# Patient Record
Sex: Female | Born: 1955
Health system: Southern US, Community
[De-identification: ages and names within clinical notes are randomized; demographics above are authoritative.]

## PROBLEM LIST (undated history)

## (undated) DIAGNOSIS — I499 Cardiac arrhythmia, unspecified: Secondary | ICD-10-CM

## (undated) DIAGNOSIS — M5090 Cervical disc disorder, unspecified, unspecified cervical region: Secondary | ICD-10-CM

## (undated) DIAGNOSIS — F32A Depression, unspecified: Secondary | ICD-10-CM

## (undated) DIAGNOSIS — Z95810 Presence of automatic (implantable) cardiac defibrillator: Secondary | ICD-10-CM

## (undated) DIAGNOSIS — I255 Ischemic cardiomyopathy: Secondary | ICD-10-CM

## (undated) DIAGNOSIS — C55 Malignant neoplasm of uterus, part unspecified: Secondary | ICD-10-CM

## (undated) DIAGNOSIS — D649 Anemia, unspecified: Secondary | ICD-10-CM

## (undated) DIAGNOSIS — I2510 Atherosclerotic heart disease of native coronary artery without angina pectoris: Secondary | ICD-10-CM

## (undated) DIAGNOSIS — K219 Gastro-esophageal reflux disease without esophagitis: Secondary | ICD-10-CM

## (undated) DIAGNOSIS — F329 Major depressive disorder, single episode, unspecified: Secondary | ICD-10-CM

## (undated) DIAGNOSIS — I219 Acute myocardial infarction, unspecified: Secondary | ICD-10-CM

## (undated) DIAGNOSIS — E785 Hyperlipidemia, unspecified: Secondary | ICD-10-CM

## (undated) HISTORY — DX: Presence of automatic (implantable) cardiac defibrillator: Z95.810

## (undated) HISTORY — PX: ABDOMINAL HYSTERECTOMY: SHX81

## (undated) HISTORY — DX: Ischemic cardiomyopathy: I25.5

## (undated) HISTORY — DX: Gastro-esophageal reflux disease without esophagitis: K21.9

## (undated) HISTORY — PX: OTHER SURGICAL HISTORY: SHX169

## (undated) HISTORY — PX: CERVICAL DISC SURGERY: SHX588

## (undated) HISTORY — PX: MASTECTOMY, PARTIAL: SHX709

## (undated) HISTORY — DX: Hyperlipidemia, unspecified: E78.5

## (undated) HISTORY — PX: PARTIAL HYSTERECTOMY: SHX80

## (undated) HISTORY — DX: Depression, unspecified: F32.A

## (undated) HISTORY — DX: Major depressive disorder, single episode, unspecified: F32.9

## (undated) HISTORY — DX: Cervical disc disorder, unspecified, unspecified cervical region: M50.90

## (undated) HISTORY — PX: CORONARY ANGIOPLASTY WITH STENT PLACEMENT: SHX49

---

## 2002-05-16 ENCOUNTER — Ambulatory Visit (HOSPITAL_COMMUNITY): Admission: RE | Admit: 2002-05-16 | Discharge: 2002-05-16 | Payer: Self-pay | Admitting: Family Medicine

## 2002-05-16 ENCOUNTER — Encounter: Payer: Self-pay | Admitting: Family Medicine

## 2004-01-31 ENCOUNTER — Ambulatory Visit (HOSPITAL_COMMUNITY): Admission: RE | Admit: 2004-01-31 | Discharge: 2004-01-31 | Payer: Self-pay | Admitting: Internal Medicine

## 2004-04-19 ENCOUNTER — Ambulatory Visit (HOSPITAL_COMMUNITY): Admission: RE | Admit: 2004-04-19 | Discharge: 2004-04-19 | Payer: Self-pay | Admitting: Internal Medicine

## 2004-04-30 ENCOUNTER — Ambulatory Visit (HOSPITAL_COMMUNITY): Admission: RE | Admit: 2004-04-30 | Discharge: 2004-04-30 | Payer: Self-pay | Admitting: Internal Medicine

## 2004-08-05 ENCOUNTER — Ambulatory Visit (HOSPITAL_COMMUNITY): Admission: RE | Admit: 2004-08-05 | Discharge: 2004-08-05 | Payer: Self-pay | Admitting: Family Medicine

## 2004-12-15 ENCOUNTER — Ambulatory Visit (HOSPITAL_COMMUNITY): Admission: RE | Admit: 2004-12-15 | Discharge: 2004-12-15 | Payer: Self-pay | Admitting: General Surgery

## 2005-02-21 ENCOUNTER — Ambulatory Visit (HOSPITAL_COMMUNITY): Admission: RE | Admit: 2005-02-21 | Discharge: 2005-02-21 | Payer: Self-pay | Admitting: General Surgery

## 2005-04-14 ENCOUNTER — Observation Stay (HOSPITAL_COMMUNITY): Admission: RE | Admit: 2005-04-14 | Discharge: 2005-04-15 | Payer: Self-pay | Admitting: Neurosurgery

## 2006-02-06 ENCOUNTER — Encounter (HOSPITAL_COMMUNITY): Admission: RE | Admit: 2006-02-06 | Discharge: 2006-03-08 | Payer: Self-pay | Admitting: Neurosurgery

## 2009-07-23 ENCOUNTER — Ambulatory Visit: Payer: Self-pay | Admitting: Cardiology

## 2009-07-23 ENCOUNTER — Inpatient Hospital Stay (HOSPITAL_COMMUNITY): Admission: EM | Admit: 2009-07-23 | Discharge: 2009-07-29 | Payer: Self-pay | Admitting: Emergency Medicine

## 2009-07-23 ENCOUNTER — Ambulatory Visit: Payer: Self-pay | Admitting: Internal Medicine

## 2009-07-23 ENCOUNTER — Encounter: Payer: Self-pay | Admitting: Internal Medicine

## 2009-07-24 ENCOUNTER — Encounter: Payer: Self-pay | Admitting: Cardiovascular Disease

## 2009-08-04 ENCOUNTER — Encounter (INDEPENDENT_AMBULATORY_CARE_PROVIDER_SITE_OTHER): Payer: Self-pay | Admitting: *Deleted

## 2009-08-05 ENCOUNTER — Encounter: Payer: Self-pay | Admitting: Cardiovascular Disease

## 2009-08-05 ENCOUNTER — Telehealth (INDEPENDENT_AMBULATORY_CARE_PROVIDER_SITE_OTHER): Payer: Self-pay | Admitting: *Deleted

## 2009-08-05 DIAGNOSIS — K219 Gastro-esophageal reflux disease without esophagitis: Secondary | ICD-10-CM

## 2009-08-05 DIAGNOSIS — F329 Major depressive disorder, single episode, unspecified: Secondary | ICD-10-CM

## 2009-08-05 DIAGNOSIS — E785 Hyperlipidemia, unspecified: Secondary | ICD-10-CM | POA: Insufficient documentation

## 2009-08-05 DIAGNOSIS — M5030 Other cervical disc degeneration, unspecified cervical region: Secondary | ICD-10-CM

## 2009-08-05 DIAGNOSIS — Z87891 Personal history of nicotine dependence: Secondary | ICD-10-CM | POA: Insufficient documentation

## 2009-08-11 ENCOUNTER — Ambulatory Visit: Payer: Self-pay | Admitting: Cardiovascular Disease

## 2009-09-04 ENCOUNTER — Ambulatory Visit: Payer: Self-pay | Admitting: Cardiology

## 2009-09-04 ENCOUNTER — Ambulatory Visit: Payer: Self-pay | Admitting: Cardiovascular Disease

## 2009-09-04 ENCOUNTER — Ambulatory Visit (HOSPITAL_COMMUNITY): Admission: RE | Admit: 2009-09-04 | Discharge: 2009-09-04 | Payer: Self-pay | Admitting: Cardiovascular Disease

## 2009-09-04 ENCOUNTER — Encounter: Payer: Self-pay | Admitting: Cardiovascular Disease

## 2009-09-04 ENCOUNTER — Ambulatory Visit: Payer: Self-pay

## 2009-09-04 ENCOUNTER — Telehealth (INDEPENDENT_AMBULATORY_CARE_PROVIDER_SITE_OTHER): Payer: Self-pay | Admitting: *Deleted

## 2009-09-24 ENCOUNTER — Ambulatory Visit: Payer: Self-pay | Admitting: Internal Medicine

## 2009-09-25 ENCOUNTER — Encounter: Payer: Self-pay | Admitting: Internal Medicine

## 2009-09-28 ENCOUNTER — Ambulatory Visit: Payer: Self-pay | Admitting: Internal Medicine

## 2009-09-28 ENCOUNTER — Inpatient Hospital Stay (HOSPITAL_COMMUNITY): Admission: AD | Admit: 2009-09-28 | Discharge: 2009-09-29 | Payer: Self-pay | Admitting: Internal Medicine

## 2009-09-29 ENCOUNTER — Encounter: Payer: Self-pay | Admitting: Internal Medicine

## 2009-09-30 ENCOUNTER — Encounter: Payer: Self-pay | Admitting: Internal Medicine

## 2009-10-08 ENCOUNTER — Telehealth: Payer: Self-pay | Admitting: Internal Medicine

## 2009-10-12 ENCOUNTER — Encounter: Payer: Self-pay | Admitting: Internal Medicine

## 2009-10-12 ENCOUNTER — Ambulatory Visit: Payer: Self-pay

## 2009-10-23 ENCOUNTER — Telehealth: Payer: Self-pay | Admitting: Internal Medicine

## 2009-10-26 ENCOUNTER — Encounter: Payer: Self-pay | Admitting: Internal Medicine

## 2009-10-28 ENCOUNTER — Telehealth (INDEPENDENT_AMBULATORY_CARE_PROVIDER_SITE_OTHER): Payer: Self-pay | Admitting: *Deleted

## 2009-11-03 ENCOUNTER — Ambulatory Visit: Payer: Self-pay | Admitting: Cardiovascular Disease

## 2009-11-18 ENCOUNTER — Encounter: Payer: Self-pay | Admitting: Cardiovascular Disease

## 2009-11-23 LAB — CONVERTED CEMR LAB
ALT: 29 units/L (ref 0–35)
AST: 26 units/L (ref 0–37)
Albumin: 4.2 g/dL (ref 3.5–5.2)
Alkaline Phosphatase: 76 units/L (ref 39–117)
BUN: 11 mg/dL (ref 6–23)
Bilirubin, Direct: 0.1 mg/dL (ref 0.0–0.3)
CO2: 27 meq/L (ref 19–32)
Calcium: 8.9 mg/dL (ref 8.4–10.5)
Chloride: 105 meq/L (ref 96–112)
Cholesterol: 148 mg/dL (ref 0–200)
Creatinine, Ser: 0.83 mg/dL (ref 0.40–1.20)
Glucose, Bld: 92 mg/dL (ref 70–99)
HDL: 42 mg/dL (ref >39)
Indirect Bilirubin: 0.2 mg/dL (ref 0.0–0.9)
LDL Cholesterol: 67 mg/dL (ref 0–99)
Potassium: 5.1 meq/L (ref 3.5–5.3)
Sodium: 139 meq/L (ref 135–145)
Total Bilirubin: 0.3 mg/dL (ref 0.3–1.2)
Total CHOL/HDL Ratio: 3.5
Total Protein: 6.9 g/dL (ref 6.0–8.3)
Triglycerides: 197 mg/dL — ABNORMAL HIGH (ref <150)
VLDL: 39 mg/dL (ref 0–40)

## 2009-12-29 ENCOUNTER — Ambulatory Visit: Payer: Self-pay | Admitting: Internal Medicine

## 2010-01-11 ENCOUNTER — Ambulatory Visit: Payer: Self-pay | Admitting: Cardiovascular Disease

## 2010-01-11 DIAGNOSIS — I2510 Atherosclerotic heart disease of native coronary artery without angina pectoris: Secondary | ICD-10-CM | POA: Insufficient documentation

## 2010-04-01 ENCOUNTER — Ambulatory Visit: Payer: Self-pay | Admitting: Internal Medicine

## 2010-04-02 ENCOUNTER — Encounter: Payer: Self-pay | Admitting: Internal Medicine

## 2010-04-07 ENCOUNTER — Encounter: Payer: Self-pay | Admitting: Internal Medicine

## 2010-05-11 ENCOUNTER — Ambulatory Visit: Payer: Self-pay | Admitting: Cardiovascular Disease

## 2010-07-08 ENCOUNTER — Ambulatory Visit: Payer: Self-pay | Admitting: Internal Medicine

## 2010-08-11 ENCOUNTER — Encounter: Payer: Self-pay | Admitting: Internal Medicine

## 2010-08-23 ENCOUNTER — Encounter: Payer: Self-pay | Admitting: Internal Medicine

## 2010-10-17 LAB — CONVERTED CEMR LAB
ALT: 19 units/L (ref 0–35)
AST: 19 units/L (ref 0–37)
Albumin: 4.5 g/dL (ref 3.5–5.2)
Alkaline Phosphatase: 86 units/L (ref 39–117)
BUN: 10 mg/dL (ref 6–23)
Basophils Absolute: 0.1 10*3/uL (ref 0.0–0.1)
Basophils Relative: 0.9 % (ref 0.0–3.0)
Bilirubin, Direct: 0 mg/dL (ref 0.0–0.3)
CO2: 27 meq/L (ref 19–32)
Calcium: 9.6 mg/dL (ref 8.4–10.5)
Chloride: 105 meq/L (ref 96–112)
Cholesterol: 127 mg/dL (ref 0–200)
Creatinine, Ser: 0.9 mg/dL (ref 0.4–1.2)
Eosinophils Absolute: 0.1 10*3/uL (ref 0.0–0.7)
Eosinophils Relative: 1.5 % (ref 0.0–5.0)
GFR calc non Af Amer: 69.95 mL/min (ref >60)
Glucose, Bld: 96 mg/dL (ref 70–99)
HCT: 40.3 % (ref 36.0–46.0)
HDL: 45.9 mg/dL (ref >39.00)
Hemoglobin: 13.1 g/dL (ref 12.0–15.0)
INR: 1.2 — ABNORMAL HIGH (ref 0.8–1.0)
LDL Cholesterol: 59 mg/dL (ref 0–99)
Lymphocytes Relative: 48.2 % — ABNORMAL HIGH (ref 12.0–46.0)
Lymphs Abs: 3.2 10*3/uL (ref 0.7–4.0)
MCHC: 32.5 g/dL (ref 30.0–36.0)
MCV: 90.8 fL (ref 78.0–100.0)
Monocytes Absolute: 0.6 10*3/uL (ref 0.1–1.0)
Monocytes Relative: 9.4 % (ref 3.0–12.0)
Neutro Abs: 2.6 10*3/uL (ref 1.4–7.7)
Neutrophils Relative %: 40 % — ABNORMAL LOW (ref 43.0–77.0)
Platelets: 202 10*3/uL (ref 150.0–400.0)
Potassium: 4 meq/L (ref 3.5–5.1)
Prothrombin Time: 11.9 s — ABNORMAL HIGH (ref 9.1–11.7)
RBC: 4.43 M/uL (ref 3.87–5.11)
RDW: 11.9 % (ref 11.5–14.6)
Sodium: 139 meq/L (ref 135–145)
Total Bilirubin: 0.8 mg/dL (ref 0.3–1.2)
Total CHOL/HDL Ratio: 3
Total Protein: 7.9 g/dL (ref 6.0–8.3)
Triglycerides: 112 mg/dL (ref 0.0–149.0)
VLDL: 22.4 mg/dL (ref 0.0–40.0)
WBC: 6.6 10*3/uL (ref 4.5–10.5)
aPTT: 32.1 s — ABNORMAL HIGH (ref 21.7–28.8)

## 2010-10-19 ENCOUNTER — Encounter: Payer: Self-pay | Admitting: Internal Medicine

## 2010-10-19 ENCOUNTER — Ambulatory Visit
Admission: RE | Admit: 2010-10-19 | Discharge: 2010-10-19 | Payer: Self-pay | Source: Home / Self Care | Attending: Internal Medicine | Admitting: Internal Medicine

## 2010-10-19 DIAGNOSIS — I5020 Unspecified systolic (congestive) heart failure: Secondary | ICD-10-CM | POA: Insufficient documentation

## 2010-10-19 DIAGNOSIS — R5383 Other fatigue: Secondary | ICD-10-CM

## 2010-10-19 DIAGNOSIS — I5022 Chronic systolic (congestive) heart failure: Secondary | ICD-10-CM | POA: Insufficient documentation

## 2010-10-19 DIAGNOSIS — R5381 Other malaise: Secondary | ICD-10-CM | POA: Insufficient documentation

## 2010-10-19 NOTE — Assessment & Plan Note (Signed)
Summary: f34m   Visit Type:  Follow-up Primary Provider:  Dr. Sherwood Gambler  CC:  No complaints.  History of Present Illness: 55 year-old woman s/p anterior MI 2010, who underwent primary PCI at that time. She had persisent severe LV dysfunction and had an ICD placed for primary SCD prevention. She presents today for follow-up evaluation. She continues to do well from a symptomatic standpoint. Active with regular walking, gardening, etc. Denies chest pain, dyspnea, edema, lightheadedness, or palps.    Current Medications (verified): 1)  Famotidine 20 Mg Tabs (Famotidine) .... Take 1 Tablet By Mouth Two Times A Day 2)  Acetaminophen 325 Mg Tabs (Acetaminophen) .... As Needed 3)  Aspirin Ec 325 Mg Tbec (Aspirin) .... Take One Tablet By Mouth Daily 4)  Cymbalta 30 Mg Cpep (Duloxetine Hcl) .... Take 1 Capsule By Mouth Once A Day 5)  Crestor 40 Mg Tabs (Rosuvastatin Calcium) .... Take 1 Tablet By Mouth Once A Day 6)  Carvedilol 12.5 Mg Tabs (Carvedilol) .... Take One Tablet By Mouth Twice A Day 7)  Plavix 75 Mg Tabs (Clopidogrel Bisulfate) .... Take One Tablet By Mouth Daily 8)  Losartan Potassium 50 Mg Tabs (Losartan Potassium) .... Take 1 Tablet By Mouth Once A Day 9)  Nitrostat 0.4 Mg Subl (Nitroglycerin) .... As Directed As Needed  Allergies (verified): No Known Drug Allergies  Past History:  Past medical history reviewed for relevance to current acute and chronic problems.  Past Medical History: Current Problems:  ISCHEMIC CARDIOMYOPATHY (ICD-414.8), post-MI LVEF less than 30%. MYOCARDIAL INFARCTION, ACUTE, ANTEROLATERAL WALL (ICD-410.00), treated with BMS of the LAD. DYSLIPIDEMIA (ICD-272.4) GASTROESOPHAGEAL REFLUX DISEASE (ICD-530.81) TOBACCO ABUSE, HX OF (ICD-V15.82) DEPRESSION (ICD-311) DISC DISEASE, CERVICAL (ICD-722.4) S/P ICD  Social History:   She is currently on disability.  She lives with her   fiance.  She previously worked as a Emergency planning/management officer for [employer].  She   smokes a  half pack cigarettes, which she has done for at least 10 years. QUIT smoking at the time of her MI.  No significant alcohol or drug use reported.   Review of Systems       Negative except as per HPI   Vital Signs:  Patient profile:   55 year old female Height:      64 inches Weight:      155.50 pounds BMI:     26.79 Pulse rate:   68 / minute Pulse rhythm:   regular Resp:     18 per minute BP sitting:   96 / 54  (left arm) Cuff size:   large  Vitals Entered By: Vikki Ports (January 11, 2010 2:16 PM)  Physical Exam  General:  Pt is alert and oriented, in no acute distress. HEENT: normal Neck: normal carotid upstrokes without bruits, JVP normal Lungs: CTA CV: RRR without murmur or gallop Abd: soft, NT, positive BS, no bruit, no organomegaly Ext: no clubbing, cyanosis, or edema. peripheral pulses 2+ and equal Skin: warm and dry without rash     ICD Specifications Following MD:  Sherryl Manges, MD     Referring MD:  Excell Seltzer ICD Vendor:  Medtronic     ICD Model Number:  D274VRC     ICD Serial Number:  [id number] ICD DOI:  09/28/2009     ICD Implanting MD:  Sherryl Manges, MD  Lead 1:    Location: RV     DOI: 09/28/2009     Model #: [id number]     Serial #: [id number]  Status: active  Indications::  ICM (414.8)    ICD Follow Up ICD Dependent:  No      Episodes Coumadin:  No  Brady Parameters Mode VVI     Lower Rate Limit:  40      Tachy Zones VF:  214     VT:  176     Impression & Recommendations:  Problem # 1:  CAD, NATIVE VESSEL (ICD-414.01) Stable without angina. Continue current medical program without changes. Pt tolerating dual antiplatelet Rx with ASA and Plavix. Continue risk reduction measures as below.  Her updated medication list for this problem includes:    Aspirin Ec 325 Mg Tbec (Aspirin) .Marland Kitchen... Take one tablet by mouth daily    Carvedilol 12.5 Mg Tabs (Carvedilol) .Marland Kitchen... Take one tablet by mouth twice a day    Plavix 75 Mg Tabs (Clopidogrel bisulfate)  .Marland Kitchen... Take one tablet by mouth daily    Nitrostat 0.4 Mg Subl (Nitroglycerin) .Marland Kitchen... As directed as needed  Problem # 2:  ISCHEMIC CARDIOMYOPATHY (ICD-414.8) Carvedilol recently increased to 12.5 mg two times a day recently. She has a soft BP and unable to further titrate ARB or B-blocker at this point. Will reassess and attmept to increase at f/u visit.  Her updated medication list for this problem includes:    Aspirin Ec 325 Mg Tbec (Aspirin) .Marland Kitchen... Take one tablet by mouth daily    Carvedilol 12.5 Mg Tabs (Carvedilol) .Marland Kitchen... Take one tablet by mouth twice a day    Plavix 75 Mg Tabs (Clopidogrel bisulfate) .Marland Kitchen... Take one tablet by mouth daily    Losartan Potassium 50 Mg Tabs (Losartan potassium) .Marland Kitchen... Take 1 tablet by mouth once a day    Nitrostat 0.4 Mg Subl (Nitroglycerin) .Marland Kitchen... As directed as needed  Problem # 3:  DYSLIPIDEMIA (ICD-272.4) Lipids excellent. Repeat next year.  Her updated medication list for this problem includes:    Crestor 40 Mg Tabs (Rosuvastatin calcium) .Marland Kitchen... Take 1 tablet by mouth once a day  CHOL: 148 (11/18/2009)   LDL: 67 (11/18/2009)   HDL: 42 (11/18/2009)   TG: 197 (11/18/2009)  Patient Instructions: 1)  Your physician recommends that you schedule a follow-up appointment in: 4 MONTHS 2)  Your physician recommends that you continue on your current medications as directed. Please refer to the Current Medication list given to you today.

## 2010-10-19 NOTE — Progress Notes (Signed)
Summary: Letter for LifeVest  Phone Note Other Incoming Call back at [phone]   Caller: [employer] Summary of Call: returning call Initial call taken by: Migdalia Dk,  October 23, 2009 11:10 AM  Follow-up for Phone Call        The insurance company is not wanting to pay for the vest. She needs a letter with specific information to send to insurance in hopes she can help the pt get this covered. Pt has [employer] of [place]. Letter needs to state: 1. condition that warranted the vest 2. plan of treatment 3. why was an ICD contra indicated. Hosp d/c summary (11/09) does not elaborate on this. I will forward this to Dr. Graciela Husbands for f/u.  Follow-up by: Duncan Dull, RN, BSN,  October 23, 2009 11:29 AM

## 2010-10-19 NOTE — Procedures (Signed)
Summary: wound check/medtronic   Problems Prior to Update: 1)  Ischemic Cardiomyopathy  (ICD-414.8) 2)  Myocardial Infarction, Acute, Anterolateral Wall  (ICD-410.00) 3)  Dyslipidemia  (ICD-272.4) 4)  Gastroesophageal Reflux Disease  (ICD-530.81) 5)  Tobacco Abuse, Hx of  (ICD-V15.82) 6)  Depression  (ICD-311) 7)  Disc Disease, Cervical  (ICD-722.4)  Medications Prior to Update: 1)  Famotidine 20 Mg Tabs (Famotidine) .... Take 1 Tablet By Mouth Two Times A Day 2)  Acetaminophen 325 Mg Tabs (Acetaminophen) .... As Needed 3)  Aspirin Ec 325 Mg Tbec (Aspirin) .... Take One Tablet By Mouth Daily 4)  Cymbalta 30 Mg Cpep (Duloxetine Hcl) .... Take 1 Capsule By Mouth Once A Day 5)  Crestor 40 Mg Tabs (Rosuvastatin Calcium) .... Take 1 Tablet By Mouth Once A Day 6)  Carvedilol 6.25 Mg Tabs (Carvedilol) .... Take One Tablet By Mouth Twice A Day 7)  Plavix 75 Mg Tabs (Clopidogrel Bisulfate) .... Take One Tablet By Mouth Daily 8)  Lisinopril 5 Mg Tabs (Lisinopril) .... Take One Tablet By Mouth Daily 9)  Nitrostat 0.4 Mg Subl (Nitroglycerin) .... As Directed As Needed  Current Medications (verified): 1)  Famotidine 20 Mg Tabs (Famotidine) .... Take 1 Tablet By Mouth Two Times A Day 2)  Acetaminophen 325 Mg Tabs (Acetaminophen) .... As Needed 3)  Aspirin Ec 325 Mg Tbec (Aspirin) .... Take One Tablet By Mouth Daily 4)  Cymbalta 30 Mg Cpep (Duloxetine Hcl) .... Take 1 Capsule By Mouth Once A Day 5)  Crestor 40 Mg Tabs (Rosuvastatin Calcium) .... Take 1 Tablet By Mouth Once A Day 6)  Carvedilol 6.25 Mg Tabs (Carvedilol) .... Take One Tablet By Mouth Twice A Day 7)  Plavix 75 Mg Tabs (Clopidogrel Bisulfate) .... Take One Tablet By Mouth Daily 8)  Lisinopril 5 Mg Tabs (Lisinopril) .... Take One Tablet By Mouth Daily 9)  Nitrostat 0.4 Mg Subl (Nitroglycerin) .... As Directed As Needed  Allergies (verified): No Known Drug Allergies   ICD Specifications Following MD:  Sherryl Manges, MD     Referring  MD:  Excell Seltzer ICD Vendor:  Medtronic     ICD Model Number:  [id number]     ICD Serial Number:  [id number] ICD DOI:  09/28/2009     ICD Implanting MD:  Sherryl Manges, MD  Lead 1:    Location: RV     DOI: 09/28/2009     Model #: [id number]     Serial #: [id number]     Status: active  Indications::  ICM (414.8)    ICD Follow Up Remote Check?  No Battery Voltage:  3.21 V     Charge Time:  8.7 seconds     Underlying rhythm:  SR ICD Dependent:  No       ICD Device Measurements Right Ventricle:  Amplitude: 10.3 mV, Impedance: 551 ohms, Threshold: 0.5 V at 0.4 msec Shock Impedance: 57 ohms   Episodes Shock:  0     ATP:  0     Nonsustained:  0     Ventricular Pacing:  0%  Brady Parameters Mode VVI     Lower Rate Limit:  40      Tachy Zones VF:  214     VT:  176     Next Cardiology Appt Due:  12/29/2009 Tech Comments:  Wound check appt. Steri-strips removed by patient.  Wound well healed.  Normal device function.  Wound care and shock instructions reviewed with patient.  Carelink box ordered.  No changes  made today.  Pt prefers to see Dr Graciela Husbands in [place] than switch to another physician in [place].  ROV 4-11 SK. Gypsy Balsam RN BSN  October 12, 2009 10:35 AM

## 2010-10-19 NOTE — Letter (Signed)
Summary: Remote Device Check  [facility], Main Office  [address]   [place], [place] [address]   Phone: [phone]  Fax: [phone]     August 11, 2010 MRN: [id number]   [name] [name] [address] [place], [place]  [address]   Dear [name],   Your remote transmission was recieved and reviewed by your physician.  All diagnostics were within normal limits for you.  __X____Your next office visit is scheduled for:  10-19-2010 @ 920 with Dr Graciela Husbands.                               Sincerely,  Vella Kohler

## 2010-10-19 NOTE — Letter (Signed)
Summary: [facility] Note   Imported By: Roderic Ovens 11/06/2009 14:21:29  _____________________________________________________________________  External Attachment:    Type:   Image     Comment:   External Document

## 2010-10-19 NOTE — Miscellaneous (Signed)
Summary: Device preload  Clinical Lists Changes  Observations: Added new observation of ICD INDICATN: CM  (09/30/2009 11:42) Added new observation of ICDLEADSER1: A (09/30/2009 11:42) Added new observation of ICDLEADMOD1: [id number] (09/30/2009 11:42) Added new observation of ICDLEADLOC1: RV (09/30/2009 11:42) Added new observation of ICD IMP MD: Sherryl Manges, MD (09/30/2009 11:42) Added new observation of ICDLEADDOI1: 09/28/2009 (09/30/2009 11:42) Added new observation of ICD IMPL DTE: 09/28/2009 (09/30/2009 11:42) Added new observation of ICD SERL#: [id number] H (09/30/2009 11:42) Added new observation of ICD MODL#: [id number] (09/30/2009 62:13) Added new observation of ICDMANUFACTR: Medtronic (09/30/2009 11:42) Added new observation of ICD MD: Sherryl Manges, MD (09/30/2009 11:42)       ICD Specifications Following MD:  Sherryl Manges, MD     ICD Vendor:  Medtronic     ICD Model Number:  D274VRC     ICD Serial Number:  [id number] ICD DOI:  09/28/2009     ICD Implanting MD:  Sherryl Manges, MD  Lead 1:    Location: RV     DOI: 09/28/2009     Model #: [id number]     Serial #: [id number]      Indications::  CM

## 2010-10-19 NOTE — Assessment & Plan Note (Signed)
Summary: POSS DEVICE PLACEMENT (414.8)/MMR   Primary Provider:  Dr. Sherwood Gambler  CC:  possible device placement.  Pt now wearing LIFEVEST portable defibrillator.  Pt reports that she has been feeling good and quit smoking 9 weeks ago.Marland Kitchen  History of Present Illness: [name] [name] is a 55 year-old woman with Anterior MI November 2010 presenting today for follow-up. She presented late into the course of an Anterior MI on 07/23/2009 and was treated with primary PCI using a bare-metal stent.  She had a large infarct with LVEF less than 30% and she was discharged home with a Lifevest to prevent sudden cardiac death within the first 40 days. She is doing well at present. Denies chest pain, dyspnea, edema, palps, lightheadedness, or syncope. She is slowly increasing her activity level. She feels well at present and has no complaints today. She  is seen in followup for consideration of ICD implantation because of anterior wall MI associated with prolonged left ventricular dysfunction as reassessed by an echo about 3 weeks ago. It remains in the 30-35% range. She is wearing a life vest for the interim. she has stopped smokiing  The patient denies SOB, chest pain, edema or palpitations   Current Medications (verified): 1)  Famotidine 20 Mg Tabs (Famotidine) .... Take 1 Tablet By Mouth Two Times A Day 2)  Acetaminophen 325 Mg Tabs (Acetaminophen) .... As Needed 3)  Aspirin Ec 325 Mg Tbec (Aspirin) .... Take One Tablet By Mouth Daily 4)  Cymbalta 30 Mg Cpep (Duloxetine Hcl) .... Take 1 Capsule By Mouth Once A Day 5)  Crestor 40 Mg Tabs (Rosuvastatin Calcium) .... Take 1 Tablet By Mouth Once A Day 6)  Carvedilol 6.25 Mg Tabs (Carvedilol) .... Take One Tablet By Mouth Twice A Day 7)  Plavix 75 Mg Tabs (Clopidogrel Bisulfate) .... Take One Tablet By Mouth Daily 8)  Lisinopril 5 Mg Tabs (Lisinopril) .... Take One Tablet By Mouth Daily 9)  Nitrostat 0.4 Mg Subl (Nitroglycerin) .... As Directed As Needed  Allergies  (verified): No Known Drug Allergies  Past History:  Past Medical History: Last updated: 08/11/2009 Current Problems:  ISCHEMIC CARDIOMYOPATHY (ICD-414.8), post-MI LVEF less than 30%. MYOCARDIAL INFARCTION, ACUTE, ANTEROLATERAL WALL (ICD-410.00), treated with BMS of the LAD. DYSLIPIDEMIA (ICD-272.4) GASTROESOPHAGEAL REFLUX DISEASE (ICD-530.81) TOBACCO ABUSE, HX OF (ICD-V15.82) DEPRESSION (ICD-311) DISC DISEASE, CERVICAL (ICD-722.4)  Past Surgical History: Last updated: August 14, 2009  cervical disk surgery.   partial right mastectomy  Percutaneous transluminal coronary angioplasty and stent to the   left anterior descending.      Family History: Last updated: 2009-08-14  Mother died at 29 from a heart attack.  Father is also   deceased.  Apparently, he had an anaphylactic reaction to getting stung  by over 150 honey bees.      Social History: Last updated: 08-14-09   She is currently on disability.  She lives with her   fiance.  She previously worked as a Emergency planning/management officer for [employer].  She   smokes a half pack cigarettes, which she has done for at least 10 years.   No significant alcohol or drug use reported.   Vital Signs:  Patient profile:   55 year old female Height:      64 inches Weight:      148 pounds BMI:     25.50 Pulse rate:   66 / minute Pulse rhythm:   regular BP sitting:   119 / 77  (right arm) Cuff size:   regular  Vitals Entered By:  Judithe Modest CMA (September 24, 2009 11:24 AM)  Physical Exam  General:  The patient was alert and oriented in no acute distress. HEENT Normal.  Neck veins were flat, carotids were brisk.  Lungs were clear.  Heart sounds were regular without murmurs or gallops.  Abdomen was soft with active bowel sounds. There is no clubbing cyanosis or edema. Skin Warm and dry    Impression & Recommendations:  Problem # 1:  ISCHEMIC CARDIOMYOPATHY (ICD-414.8)  she has persistent left ventricular dysfunction more than 6 weeks out  following her myocardial infarction. She does qualify for ICD implantation as per standard criteria. I have reviewed with her the potential benefits as well as potential risks including but not limited to perforation infection lead dislodgment and device malfunction. She understands these risks and would like to proceed.  Maintence of her long term medications is appropriate,  She should probably be started on aldosterone antagonist based on Emphasis Trial--will anticipate this when she returns to clinic folllowing device implantation Her updated medication list for this problem includes:  The other question is whether she should be on Aldactone. Our review this situation with Dr. Excell Seltzer    Aspirin Ec 325 Mg Tbec (Aspirin) .Marland Kitchen... Take one tablet by mouth daily    Carvedilol 6.25 Mg Tabs (Carvedilol) .Marland Kitchen... Take one tablet by mouth twice a day    Plavix 75 Mg Tabs (Clopidogrel bisulfate) .Marland Kitchen... Take one tablet by mouth daily    Lisinopril 5 Mg Tabs (Lisinopril) .Marland Kitchen... Take one tablet by mouth daily    Nitrostat 0.4 Mg Subl (Nitroglycerin) .Marland Kitchen... As directed as needed  Orders: TLB-BMP (Basic Metabolic Panel-BMET) (80048-METABOL) TLB-CBC Platelet - w/Differential (85025-CBCD) TLB-PT (Protime) (85610-PTP) TLB-PTT (85730-PTTL)  Problem # 2:  ISCHEMIC CARDIOMYOPATHY (ICD-414.8)     Orders: TLB-BMP (Basic Metabolic Panel-BMET) (80048-METABOL) TLB-CBC Platelet - w/Differential (85025-CBCD) TLB-PT (Protime) (85610-PTP) TLB-PTT (85730-PTTL)  Other Orders: EKG w/ Interpretation (93000)  Patient Instructions: 1)  Your physician recommends that you HAVE LABS TODAY: BMET, CBC, PT, PTT 2)  Your physician has recommended that you have a defibrillator inserted.  An implantable cardioverter defibrillator (ICD) is a small device that is placed in your chest or, in rare cases, your abdomen. This device uses electrical pulses or shocks to help control life-threatening, irregular heartbeats that could lead the  heart to suddenly stop beating (sudden cardiac arrest). Leads are attached to the ICD that goes into your heart. This is done in the hospital and usually requires an overnight stay. Please see the instruction sheet given to you today for more information.

## 2010-10-19 NOTE — Progress Notes (Signed)
Summary:  Need Fax #  Phone Note Outgoing Call   Call placed by: Duncan Dull, RN, BSN,  October 28, 2009 3:28 PM Summary of Call: Francisca December from [employer] to get a fax # to fax the letter.   left message on machine  Duncan Dull, RN, BSN  October 28, 2009 3:29 PM   Follow-up for Phone Call        Fax # received [phone]. Letter faxed. Follow-up by: Duncan Dull, RN, BSN,  October 29, 2009 3:18 PM

## 2010-10-19 NOTE — Cardiovascular Report (Signed)
Summary: Office Visit Remote   Office Visit Remote   Imported By: Roderic Ovens 04/08/2010 10:54:21  _____________________________________________________________________  External Attachment:    Type:   Image     Comment:   External Document

## 2010-10-19 NOTE — Assessment & Plan Note (Signed)
Summary: 4 month rov   Visit Type:  4 months follow up Primary Provider:  Dr. Sherwood Gambler  CC:  no cardiac complaints.  History of Present Illness: 55 year-old [name] s/p anterior MI 2010, who underwent primary PCI at that time. She had persisent severe LV dysfunction and had an ICD placed for primary SCD prevention. She presents today for follow-up evaluation.   She is doing well at present, but reports slacking off on her exercise program. Denies exertional symptoms or other complaints. The patient denies chest pain, dyspnea, orthopnea, PND, edema, palpitations, lightheadedness, or syncope. Good compliance with her medical program. No ICD discharges.     Current Medications (verified): 1)  Famotidine 20 Mg Tabs (Famotidine) .... Take 1 Tablet By Mouth Two Times A Day 2)  Acetaminophen 325 Mg Tabs (Acetaminophen) .... As Needed 3)  Aspirin Ec 325 Mg Tbec (Aspirin) .... Take One Tablet By Mouth Daily 4)  Cymbalta 30 Mg Cpep (Duloxetine Hcl) .... Take 1 Capsule By Mouth Once A Day 5)  Crestor 40 Mg Tabs (Rosuvastatin Calcium) .... Take 1 Tablet By Mouth Once A Day 6)  Carvedilol 12.5 Mg Tabs (Carvedilol) .... Take One Tablet By Mouth Twice A Day 7)  Plavix 75 Mg Tabs (Clopidogrel Bisulfate) .... Take One Tablet By Mouth Daily 8)  Losartan Potassium 50 Mg Tabs (Losartan Potassium) .... Take 1 Tablet By Mouth Once A Day 9)  Nitrostat 0.4 Mg Subl (Nitroglycerin) .... As Directed As Needed  Allergies (verified): No Known Drug Allergies  Past History:  Past medical history reviewed for relevance to current acute and chronic problems.  Past Medical History: Reviewed history from 01/11/2010 and no changes required. Current Problems:  ISCHEMIC CARDIOMYOPATHY (ICD-414.8), post-MI LVEF less than 30%. MYOCARDIAL INFARCTION, ACUTE, ANTEROLATERAL WALL (ICD-410.00), treated with BMS of the LAD. DYSLIPIDEMIA (ICD-272.4) GASTROESOPHAGEAL REFLUX DISEASE (ICD-530.81) TOBACCO ABUSE, HX OF  (ICD-V15.82) DEPRESSION (ICD-311) DISC DISEASE, CERVICAL (ICD-722.4) S/P ICD  Review of Systems       Negative except as per HPI   Vital Signs:  Patient profile:   55 year old female Height:      [name] inches Weight:      156.50 pounds BMI:     26.96 Pulse rate:   63 / minute Pulse rhythm:   regular Resp:     18 per minute BP sitting:   100 / [name]  (left arm) Cuff size:   large  Vitals Entered By: Vikki Ports (May 11, 2010 1:51 PM)  Physical Exam  General:  Pt is alert and oriented, in no acute distress. HEENT: normal Neck: normal carotid upstrokes without bruits, JVP normal Lungs: CTA CV: RRR without murmur or gallop Abd: soft, NT, positive BS, no bruit, no organomegaly Ext: no clubbing, cyanosis, or edema. peripheral pulses 2+ and equal Skin: warm and dry without rash    EKG  Procedure date:  05/11/2010  Findings:      NSR, age-indeterminate septal infarct, otherwise within normal limits.   ICD Specifications Following MD:  Sherryl Manges, MD     Referring MD:  Excell Seltzer ICD Vendor:  Medtronic     ICD Model Number:  [id number]     ICD Serial Number:  [id number] ICD DOI:  09/28/2009     ICD Implanting MD:  Sherryl Manges, MD  Lead 1:    Location: RV     DOI: 09/28/2009     Model #: [id number]     Serial #: [id number]     Status: active  Indications::  ICM (414.8)    ICD Follow Up ICD Dependent:  No      Episodes Coumadin:  No  Brady Parameters Mode VVI     Lower Rate Limit:  40      Tachy Zones VF:  214     VT:  176     Impression & Recommendations:  Problem # 1:  CAD, NATIVE VESSEL (ICD-414.01) Stable without angina. No CHF symptoms. Continue current medical program. BP does not allow for upward med titration.  Her updated medication list for this problem includes:    Aspirin Ec 325 Mg Tbec (Aspirin) .Marland Kitchen... Take one tablet by mouth daily    Carvedilol 12.5 Mg Tabs (Carvedilol) .Marland Kitchen... Take one tablet by mouth twice a day    Plavix 75 Mg Tabs (Clopidogrel  bisulfate) .Marland Kitchen... Take one tablet by mouth daily    Nitrostat 0.4 Mg Subl (Nitroglycerin) .Marland Kitchen... As directed as needed  Orders: EKG w/ Interpretation (93000)  Problem # 2:  ISCHEMIC CARDIOMYOPATHY (ICD-414.8) Appears euvolemic and tolerating coreg/losartan. Will continue.  Her updated medication list for this problem includes:    Aspirin Ec 325 Mg Tbec (Aspirin) .Marland Kitchen... Take one tablet by mouth daily    Carvedilol 12.5 Mg Tabs (Carvedilol) .Marland Kitchen... Take one tablet by mouth twice a day    Plavix 75 Mg Tabs (Clopidogrel bisulfate) .Marland Kitchen... Take one tablet by mouth daily    Losartan Potassium 50 Mg Tabs (Losartan potassium) .Marland Kitchen... Take 1 tablet by mouth once a day    Nitrostat 0.4 Mg Subl (Nitroglycerin) .Marland Kitchen... As directed as needed  Problem # 3:  DYSLIPIDEMIA (ICD-272.4) Lipids at goal on crestor with LDL less than 70.  Her updated medication list for this problem includes:    Crestor 40 Mg Tabs (Rosuvastatin calcium) .Marland Kitchen... Take 1 tablet by mouth once a day  CHOL: 148 (11/18/2009)   LDL: 67 (11/18/2009)   HDL: 42 (11/18/2009)   TG: 197 (11/18/2009)  Patient Instructions: 1)  Your physician recommends that you return for a FASTING LIPID and LIVER Profile in 6 MONTHS (412, 428.22)  2)  Your physician recommends that you continue on your current medications as directed. Please refer to the Current Medication list given to you today. 3)  Your physician wants you to follow-up in:  6 MONTHS.  You will receive a reminder letter in the mail two months in advance. If you don't receive a letter, please call our office to schedule the follow-up appointment.

## 2010-10-19 NOTE — Cardiovascular Report (Signed)
Summary: Office Visit Remote   Office Visit Remote   Imported By: Roderic Ovens 08/23/2010 15:00:16  _____________________________________________________________________  External Attachment:    Type:   Image     Comment:   External Document

## 2010-10-19 NOTE — Cardiovascular Report (Signed)
Summary: Pre Op Orders  Pre Op Orders   Imported By: Roderic Ovens 10/09/2009 16:13:50  _____________________________________________________________________  External Attachment:    Type:   Image     Comment:   External Document

## 2010-10-19 NOTE — Progress Notes (Signed)
Summary:  [facility]   Phone Note Other Incoming   Caller: [facility]OLL LIFE VEST [phone] JANNA Summary of Call: CALLING NEED A LETTER FOR THE USE OF LIFE VEST FOR THE PT. Initial call taken by: Judie Grieve,  October 08, 2009 10:36 AM  Follow-up for Phone Call        Called and left message on machine. I have not received anything on this pt. I need to know exactly what she needs. Duncan Dull, RN, BSN  October 09, 2009 10:36 AM

## 2010-10-19 NOTE — Letter (Signed)
Summary: Remote Device Check  [facility], Main Office  [address]   [place], [place] [address]   Phone: [phone]  Fax: [phone]     April 07, 2010 MRN: [id number]   SHUNDRA WIRSING [address] [place], [place]  [address]   Dear Ms. Buttacavoli,   Your remote transmission was recieved and reviewed by your physician.  All diagnostics were within normal limits for you.  __X___Your next transmission is scheduled for:  07-08-2010                     .  Please transmit at any time this day.  If you have a wireless device your transmission will be sent automatically.   Sincerely,  Vella Kohler

## 2010-10-19 NOTE — Assessment & Plan Note (Signed)
Summary: pc2/medtronic   Primary Provider:  Dr. Sherwood Gambler  CC:  pc2 medtronic.  History of Present Illness: [name] [name] is a 55 year-old woman with Anterior MI November 2010  had persistent left ventricular dysfunction. She was managed with aLIFE VEST  until the winter and at that time underwent primary prevention ICD implantation.  she is doing well.The patient denies SOB, chest pain, edema or palpitations  she has no lightheadedness and no significant fatigue  Current Medications (verified): 1)  Famotidine 20 Mg Tabs (Famotidine) .... Take 1 Tablet By Mouth Two Times A Day 2)  Acetaminophen 325 Mg Tabs (Acetaminophen) .... As Needed 3)  Aspirin Ec 325 Mg Tbec (Aspirin) .... Take One Tablet By Mouth Daily 4)  Cymbalta 30 Mg Cpep (Duloxetine Hcl) .... Take 1 Capsule By Mouth Once A Day 5)  Crestor 40 Mg Tabs (Rosuvastatin Calcium) .... Take 1 Tablet By Mouth Once A Day 6)  Carvedilol 6.25 Mg Tabs (Carvedilol) .... Take One Tablet By Mouth Twice A Day 7)  Plavix 75 Mg Tabs (Clopidogrel Bisulfate) .... Take One Tablet By Mouth Daily 8)  Lisinopril 5 Mg Tabs (Lisinopril) .... Take One Tablet Once Daily 9)  Nitrostat 0.4 Mg Subl (Nitroglycerin) .... As Directed As Needed  Allergies (verified): No Known Drug Allergies  Past History:  Past Medical History: Last updated: 08/11/2009 Current Problems:  ISCHEMIC CARDIOMYOPATHY (ICD-414.8), post-MI LVEF less than 30%. MYOCARDIAL INFARCTION, ACUTE, ANTEROLATERAL WALL (ICD-410.00), treated with BMS of the LAD. DYSLIPIDEMIA (ICD-272.4) GASTROESOPHAGEAL REFLUX DISEASE (ICD-530.81) TOBACCO ABUSE, HX OF (ICD-V15.82) DEPRESSION (ICD-311) DISC DISEASE, CERVICAL (ICD-722.4)  Past Surgical History: Last updated: 08-09-2009  cervical disk surgery.   partial right mastectomy  Percutaneous transluminal coronary angioplasty and stent to the   left anterior descending.      Family History: Last updated: 2009-08-09  Mother died at 36 from a heart  attack.  Father is also   deceased.  Apparently, he had an anaphylactic reaction to getting stung  by over 150 honey bees.      Social History: Last updated: 2009/08/09   She is currently on disability.  She lives with her   fiance.  She previously worked as a Emergency planning/management officer for [employer].  She   smokes a half pack cigarettes, which she has done for at least 10 years.   No significant alcohol or drug use reported.   Vital Signs:  Patient profile:   55 year old female Height:      64 inches Weight:      157 pounds BMI:     27.05 Pulse rate:   73 / minute Pulse rhythm:   regular BP sitting:   122 / 74  (left arm) Cuff size:   regular  Vitals Entered By: Judithe Modest CMA (December 29, 2009 9:59 AM)  Physical Exam  General:  The patient was alert and oriented in no acute distress. HEENT Normal.  Neck veins were flat, carotids were brisk.  Lungs were clear.  Heart sounds were regular without murmurs or gallops.  Abdomen was soft with active bowel sounds. There is no clubbing cyanosis or edema. Skin Warm and dry wound is well-healed and without atrophy    ICD Specifications Following MD:  Sherryl Manges, MD     Referring MD:  Excell Seltzer ICD Vendor:  Medtronic     ICD Model Number:  [id number]     ICD Serial Number:  [id number] ICD DOI:  09/28/2009     ICD Implanting MD:  Sherryl Manges, MD  Lead 1:    Location: RV     DOI: 09/28/2009     Model #: [id number]     Serial #: [id number]     Status: active  Indications::  ICM (414.8)    ICD Follow Up Remote Check?  No Battery Voltage:  3.21 V     Charge Time:  8.7 seconds     ICD Dependent:  No       ICD Device Measurements Right Ventricle:  Amplitude: 13.5 mV, Impedance: 646 ohms, Threshold: 0.75 V at 0.4 msec Shock Impedance: 61 ohms   Episodes Coumadin:  No Shock:  0     ATP:  0     Nonsustained:  0     Ventricular Pacing:  <0.1%  Brady Parameters Mode VVI     Lower Rate Limit:  40      Tachy Zones VF:  214     VT:  176     Next  Remote Date:  04/01/2010     Next Cardiology Appt Due:  12/19/2010 Tech Comments:  No parameter changes.  Device function normal. Optivol and thoracic impedance normal.  Carelink transmissions every 3 months.  Checked by [employer].  ROV 1 year with Dr. Graciela Husbands. Altha Harm, LPN  December 29, 2009 10:12 AM   Impression & Recommendations:  Problem # 1:  IMPLANTABLE DEFIBRILLATOR  MDT SINGLE CHAMBER (ICD-V45.02) Device parameters and data were reviewed and no changes were made  Problem # 2:  ISCHEMIC CARDIOMYOPATHY (ICD-414.8) she is doing quite well. She is to see Dr. Excell Seltzer in 2 weeks' time. We will try and increase her carvedilol dose between now and then. I suggested that she take 12.5 twice a day. In the event that she has significant lightheadedness or fatigue she is to reduce the dose to 9.375 until she sees him Her updated medication list for this problem includes:    Aspirin Ec 325 Mg Tbec (Aspirin) .Marland Kitchen... Take one tablet by mouth daily    Carvedilol 12.5 Mg Tabs (Carvedilol) .Marland Kitchen... Take one tablet by mouth twice a day    Plavix 75 Mg Tabs (Clopidogrel bisulfate) .Marland Kitchen... Take one tablet by mouth daily    Lisinopril 5 Mg Tabs (Lisinopril) .Marland Kitchen... Take one tablet once daily    Nitrostat 0.4 Mg Subl (Nitroglycerin) .Marland Kitchen... As directed as needed  Patient Instructions: 1)  Your physician has recommended you make the following change in your medication: Increase your Carvedilol to 12.5mg  1 tablet twice daily. 2)  Your physician wants you to follow-up in: JANUARY 2012 WITH DR Graciela Husbands.  You will receive a reminder letter in the mail two months in advance. If you don't receive a letter, please call our office to schedule the follow-up appointment. Prescriptions: CARVEDILOL 12.5 MG TABS (CARVEDILOL) Take one tablet by mouth twice a day  #60 x 11   Entered by:   Optometrist BSN   Authorized by:   Nathen May, MD, [facility]   Signed by:   Gypsy Balsam RN BSN on 12/29/2009   Method used:   Electronically  to        [facility]. [phone]* (retail)       [address], [place]  [address]       Ph: [phone]       Fax: [phone]   RxID:   [id number]

## 2010-10-19 NOTE — Miscellaneous (Signed)
Summary: Orders Update  Clinical Lists Changes  Orders: Added new Test order of TLB-Hepatic/Liver Function Pnl (80076-HEPATIC) - Signed Added new Test order of TLB-Lipid Panel (80061-LIPID) - Signed 

## 2010-10-19 NOTE — Cardiovascular Report (Signed)
Summary: Office Visit   Office Visit   Imported By: Roderic Ovens 12/29/2009 15:15:49  _____________________________________________________________________  External Attachment:    Type:   Image     Comment:   External Document

## 2010-10-19 NOTE — Cardiovascular Report (Signed)
Summary: Office Visit   Office Visit   Imported By: Roderic Ovens 10/21/2009 13:46:42  _____________________________________________________________________  External Attachment:    Type:   Image     Comment:   External Document

## 2010-10-19 NOTE — Assessment & Plan Note (Signed)
Summary: 2 MO F/U   Visit Type:  2 months follow up Primary Provider:  Dr. Sherwood Gambler  CC:  Cough.  History of Present Illness: [name] [name] is a 55 year-old woman with Anterior MI November 2010 presenting today for follow-up. She presented late into the course of an Anterior MI on 07/23/2009 and was treated with primary PCI using a bare-metal stent.  She had a post-MI LVEF of 30% and underwent ICD implant in January. She is doing well at present. Denies chest pain, palps, dyspnea, or edema. She is active and has no symptoms with exertion.   Current Medications (verified): 1)  Famotidine 20 Mg Tabs (Famotidine) .... Take 1 Tablet By Mouth Two Times A Day 2)  Acetaminophen 325 Mg Tabs (Acetaminophen) .... As Needed 3)  Aspirin Ec 325 Mg Tbec (Aspirin) .... Take One Tablet By Mouth Daily 4)  Cymbalta 30 Mg Cpep (Duloxetine Hcl) .... Take 1 Capsule By Mouth Once A Day 5)  Crestor 40 Mg Tabs (Rosuvastatin Calcium) .... Take 1 Tablet By Mouth Once A Day 6)  Carvedilol 6.25 Mg Tabs (Carvedilol) .... Take One Tablet By Mouth Twice A Day 7)  Plavix 75 Mg Tabs (Clopidogrel Bisulfate) .... Take One Tablet By Mouth Daily 8)  Lisinopril 5 Mg Tabs (Lisinopril) .... Take One Tablet By Mouth Daily 9)  Nitrostat 0.4 Mg Subl (Nitroglycerin) .... As Directed As Needed  Allergies (verified): No Known Drug Allergies  Past History:  Past medical history reviewed for relevance to current acute and chronic problems.  Past Medical History: Reviewed history from 08/11/2009 and no changes required. Current Problems:  ISCHEMIC CARDIOMYOPATHY (ICD-414.8), post-MI LVEF less than 30%. MYOCARDIAL INFARCTION, ACUTE, ANTEROLATERAL WALL (ICD-410.00), treated with BMS of the LAD. DYSLIPIDEMIA (ICD-272.4) GASTROESOPHAGEAL REFLUX DISEASE (ICD-530.81) TOBACCO ABUSE, HX OF (ICD-V15.82) DEPRESSION (ICD-311) DISC DISEASE, CERVICAL (ICD-722.4)  Review of Systems       Nonproductive cough, otherwise negative except as per  HPI  Vital Signs:  Patient profile:   55 year old female Height:      64 inches Weight:      152.75 pounds BMI:     26.31 Pulse rate:   70 / minute Pulse rhythm:   regular Resp:     18 per minute BP sitting:   94 / 55  (left arm) Cuff size:   large  Vitals Entered By: Vikki Ports (November 03, 2009 2:19 PM)  Physical Exam  General:  Pt is alert and oriented, in no acute distress. HEENT: normal Neck: normal carotid upstrokes without bruits, JVP normal Chest - ICD site intact without redness or ternderness. Lungs: CTA CV: RRR without murmur or gallop Abd: soft, NT, positive BS, no bruit, no organomegaly Ext: no clubbing, cyanosis, or edema. peripheral pulses 2+ and equal Skin: warm and dry without rash     ICD Specifications Following MD:  Sherryl Manges, MD     Referring MD:  Excell Seltzer ICD Vendor:  Medtronic     ICD Model Number:  [id number]     ICD Serial Number:  [id number] ICD DOI:  09/28/2009     ICD Implanting MD:  Sherryl Manges, MD  Lead 1:    Location: RV     DOI: 09/28/2009     Model #: [id number]     Serial #: [id number]     Status: active  Indications::  ICM (414.8)    ICD Follow Up ICD Dependent:  No      Brady Parameters Mode VVI  Lower Rate Limit:  40      Tachy Zones VF:  214     VT:  176     Impression & Recommendations:  Problem # 1:  ISCHEMIC CARDIOMYOPATHY (ICD-414.8) Stable, NYHA Class II. Complains of cough, likely ACE-induced. Will d/c lisinopril and start cozaar. Otherwise continue current medical therapy. No room to titrate meds because of low BP.  Her updated medication list for this problem includes:    Aspirin Ec 325 Mg Tbec (Aspirin) .Marland Kitchen... Take one tablet by mouth daily    Carvedilol 6.25 Mg Tabs (Carvedilol) .Marland Kitchen... Take one tablet by mouth twice a day    Plavix 75 Mg Tabs (Clopidogrel bisulfate) .Marland Kitchen... Take one tablet by mouth daily    Cozaar 50 Mg Tabs (Losartan potassium) .Marland Kitchen... Take one tablet by mouth daily    Nitrostat 0.4 Mg Subl  (Nitroglycerin) .Marland Kitchen... As directed as needed  Problem # 2:  DYSLIPIDEMIA (ICD-272.4) Repeat lipids and LFT's in 2 weeks for f/u. Lipids at goal at time of last check.  Her updated medication list for this problem includes:    Crestor 40 Mg Tabs (Rosuvastatin calcium) .Marland Kitchen... Take 1 tablet by mouth once a day  CHOL: 127 (09/24/2009)   LDL: 59 (09/24/2009)   HDL: 45.90 (09/24/2009)   TG: 112.0 (09/24/2009)  Problem # 3:  MYOCARDIAL  INFARCTION, ANTEROLATERAL WALL, SUBSEQUENT CARE (ICD-410.02) Continue dual antiplatelet Rx with ASA/Plavix and ongoing risk reduction measures as outlined.  Her updated medication list for this problem includes:    Aspirin Ec 325 Mg Tbec (Aspirin) .Marland Kitchen... Take one tablet by mouth daily    Carvedilol 6.25 Mg Tabs (Carvedilol) .Marland Kitchen... Take one tablet by mouth twice a day    Plavix 75 Mg Tabs (Clopidogrel bisulfate) .Marland Kitchen... Take one tablet by mouth daily    Nitrostat 0.4 Mg Subl (Nitroglycerin) .Marland Kitchen... As directed as needed  Patient Instructions: 1)  Your physician recommends that you schedule a follow-up appointment in: 2 MONTHS 2)  Your physician recommends that you return for a FASTING LIPID, LIVER and BMP in 2 WEEKS at Dr Fusco's office (nothing to eat or drink after midnight) 3)  Your physician has recommended you make the following change in your medication: STOP Lisinopril, START Cozaar 50mg  once a day Prescriptions: COZAAR 50 MG TABS (LOSARTAN POTASSIUM) Take one tablet by mouth daily  #30 x 8   Entered by:   Julieta Gutting, RN, BSN   Authorized by:   Norva Karvonen, MD   Signed by:   Julieta Gutting, RN, BSN on 11/03/2009   Method used:   Electronically to        [facility] [address] [phone]* (retail)       [address], [place]  [address]       Ph: [phone]       Fax: [phone]   RxID:   [id number]

## 2010-10-22 ENCOUNTER — Ambulatory Visit: Admit: 2010-10-22 | Payer: Self-pay | Admitting: Cardiovascular Disease

## 2010-10-22 ENCOUNTER — Ambulatory Visit (INDEPENDENT_AMBULATORY_CARE_PROVIDER_SITE_OTHER): Payer: Medicare Other | Admitting: Cardiovascular Disease

## 2010-10-22 ENCOUNTER — Encounter: Payer: Self-pay | Admitting: Cardiovascular Disease

## 2010-10-22 DIAGNOSIS — I5022 Chronic systolic (congestive) heart failure: Secondary | ICD-10-CM

## 2010-10-22 DIAGNOSIS — I252 Old myocardial infarction: Secondary | ICD-10-CM

## 2010-10-22 DIAGNOSIS — E7800 Pure hypercholesterolemia, unspecified: Secondary | ICD-10-CM

## 2010-10-27 ENCOUNTER — Encounter: Payer: Self-pay | Admitting: Cardiovascular Disease

## 2010-10-27 ENCOUNTER — Other Ambulatory Visit (INDEPENDENT_AMBULATORY_CARE_PROVIDER_SITE_OTHER): Payer: Medicare Other

## 2010-10-27 ENCOUNTER — Other Ambulatory Visit: Payer: Self-pay | Admitting: Cardiovascular Disease

## 2010-10-27 DIAGNOSIS — I2109 ST elevation (STEMI) myocardial infarction involving other coronary artery of anterior wall: Secondary | ICD-10-CM

## 2010-10-27 DIAGNOSIS — E7800 Pure hypercholesterolemia, unspecified: Secondary | ICD-10-CM

## 2010-10-27 DIAGNOSIS — I252 Old myocardial infarction: Secondary | ICD-10-CM

## 2010-10-27 DIAGNOSIS — I5022 Chronic systolic (congestive) heart failure: Secondary | ICD-10-CM

## 2010-10-27 LAB — CBC WITH DIFFERENTIAL/PLATELET
Basophils Absolute: 0 10*3/uL (ref 0.0–0.1)
Basophils Relative: 0.4 % (ref 0.0–3.0)
Eosinophils Relative: 1.1 % (ref 0.0–5.0)
HCT: 39.7 % (ref 36.0–46.0)
Lymphocytes Relative: 31.3 % (ref 12.0–46.0)
Lymphs Abs: 3 10*3/uL (ref 0.7–4.0)
MCHC: 33.9 g/dL (ref 30.0–36.0)
MCV: 87.3 fl (ref 78.0–100.0)
Monocytes Absolute: 0.7 10*3/uL (ref 0.1–1.0)
Monocytes Relative: 7.6 % (ref 3.0–12.0)
Neutro Abs: 5.8 10*3/uL (ref 1.4–7.7)
Platelets: 191 10*3/uL (ref 150.0–400.0)
RBC: 4.55 Mil/uL (ref 3.87–5.11)
RDW: 13.4 % (ref 11.5–14.6)
WBC: 9.7 10*3/uL (ref 4.5–10.5)

## 2010-10-27 LAB — BASIC METABOLIC PANEL
BUN: 10 mg/dL (ref 6–23)
CO2: 31 mEq/L (ref 19–32)
Calcium: 9.4 mg/dL (ref 8.4–10.5)
Chloride: 106 mEq/L (ref 96–112)
Creatinine, Ser: 0.9 mg/dL (ref 0.4–1.2)
Glucose, Bld: 96 mg/dL (ref 70–99)
Potassium: 4.3 mEq/L (ref 3.5–5.1)
Sodium: 140 mEq/L (ref 135–145)

## 2010-10-27 LAB — PROTIME-INR: Prothrombin Time: 11.9 s (ref 10.2–12.4)

## 2010-10-27 NOTE — Assessment & Plan Note (Signed)
Summary: medtronic/saf   Primary Provider:  Dr. Sherwood Gambler  CC:  no complaints.  History of Present Illness: [name] is seen in followup for an ICD implanted for primary prevention OCT 2011 .  She is a 55 year-old woman s/p anterior MI 2010, who underwent primary PCI at that time. She had persisent severe LV dysfunction   She denies chest pain or shortness of breath. there has been no device  Her major complaint is fatigue following the taking of her morning medications.         Current Medications (verified): 1)  Famotidine 20 Mg Tabs (Famotidine) .... Take 1 Tablet By Mouth Two Times A Day 2)  Acetaminophen 325 Mg Tabs (Acetaminophen) .... As Needed 3)  Aspirin Ec 325 Mg Tbec (Aspirin) .... Take One Tablet By Mouth Daily 4)  Cymbalta 30 Mg Cpep (Duloxetine Hcl) .... Take 1 Capsule By Mouth Once A Day 5)  Crestor 40 Mg Tabs (Rosuvastatin Calcium) .... Take 1 Tablet By Mouth Once A Day 6)  Carvedilol 12.5 Mg Tabs (Carvedilol) .... Take One Tablet By Mouth Twice A Day 7)  Plavix 75 Mg Tabs (Clopidogrel Bisulfate) .... Take One Tablet By Mouth Daily 8)  Losartan Potassium 50 Mg Tabs (Losartan Potassium) .... Take 1 Tablet By Mouth Once A Day 9)  Nitrostat 0.4 Mg Subl (Nitroglycerin) .... As Directed As Needed  Allergies (verified): No Known Drug Allergies  Past History:  Past Medical History: Last updated: 01/11/2010 Current Problems:  ISCHEMIC CARDIOMYOPATHY (ICD-414.8), post-MI LVEF less than 30%. MYOCARDIAL INFARCTION, ACUTE, ANTEROLATERAL WALL (ICD-410.00), treated with BMS of the LAD. DYSLIPIDEMIA (ICD-272.4) GASTROESOPHAGEAL REFLUX DISEASE (ICD-530.81) TOBACCO ABUSE, HX OF (ICD-V15.82) DEPRESSION (ICD-311) DISC DISEASE, CERVICAL (ICD-722.4) S/P ICD  Past Surgical History: Last updated: August 20, 2009  cervical disk surgery.   partial right mastectomy  Percutaneous transluminal coronary angioplasty and stent to the   left anterior descending.      Family  History: Last updated: Aug 20, 2009  Mother died at 21 from a heart attack.  Father is also   deceased.  Apparently, he had an anaphylactic reaction to getting stung  by over 150 honey bees.      Social History: Last updated: 01/11/2010   She is currently on disability.  She lives with her   fiance.  She previously worked as a Emergency planning/management officer for [employer].  She   smokes a half pack cigarettes, which she has done for at least 10 years. QUIT smoking at the time of her MI.  No significant alcohol or drug use reported.   Vital Signs:  Patient profile:   55 year old female Height:      64 inches Weight:      156.50 pounds BMI:     26.96 Pulse rate:   65 / minute BP sitting:   112 / 70  (left arm) Cuff size:   regular  Vitals Entered By: Micki Riley CNA (October 19, 2010 9:10 AM)  Physical Exam  General:  The patient was alert and oriented in no acute distress. HEENT Normal.  Neck veins were flat, carotids were brisk.  Lungs were clear.  Heart sounds were regular without murmurs or gallops.  Abdomen was soft with active bowel sounds. There is no clubbing cyanosis or edema. Skin Warm and dry Wound is well healed    ICD Specifications Following MD:  Sherryl Manges, MD     Referring MD:  Excell Seltzer ICD Vendor:  Medtronic     ICD Model Number:  [id number]  ICD Serial Number:  [id number] ICD DOI:  09/28/2009     ICD Implanting MD:  Sherryl Manges, MD  Lead 1:    Location: RV     DOI: 09/28/2009     Model #: [id number]     Serial #: [id number]     Status: active  Indications::  ICM (414.8)    ICD Follow Up Battery Voltage:  3.18 V     Charge Time:  8.4 seconds     Underlying rhythm:  SR @ 65 ICD Dependent:  No       ICD Device Measurements Right Ventricle:  Amplitude: 15.1 mV, Impedance: 703 ohms, Threshold: 1.00 V at 0.40 msec Shock Impedance: 76 ohms   Episodes MS Episodes:  0     Coumadin:  No Shock:  0     ATP:  0     Nonsustained:  0     Atrial Therapies:  0 Ventricular Pacing:   <0.1%  Brady Parameters Mode VVI     Lower Rate Limit:  40      Tachy Zones VF:  214     VT:  176     Next Remote Date:  01/20/2011     Next Cardiology Appt Due:  09/20/2011 Tech Comments:  1 NST EPISODE AND 3 SVT EPISODES.  NORMAL DEVICE FUNCTION.  CHANGED MAX LEAD IMPEDANCE FOR LIA.  DEMONSTRATED TONE FOR PT AND PT AWARE TO CALL IF HEARD. CARELINK 01-20-11 AND ROV IN 12 MTHS W/SK. Vella Kohler  October 19, 2010 9:16 AM  Impression & Recommendations:  Problem # 1:  FATIGUE (ICD-780.79) This is likely drug related, and the culprit is probably the carvediolol   I will maove all the other meds to pm and if the symptoms persist, perhaps we can put her on coreg CR to take at night  Problem # 2:  IMPLANTABLE DEFIBRILLATOR  MDT SINGLE CHAMBER (ICD-V45.02) Device parameters and data were reviewed and no changes were made  Problem # 3:  ISCHEMIC CARDIOMYOPATHY (ICD-414.8) stabke on current meds;  I have suggested that we begin aldactoe.  She is to see Dr Excell Seltzer on Fri and will defer this decision to him Her updated medication list for this problem includes:    Aspirin Ec 325 Mg Tbec (Aspirin) .Marland Kitchen... Take one tablet by mouth daily    Carvedilol 12.5 Mg Tabs (Carvedilol) .Marland Kitchen... Take one tablet by mouth twice a day    Plavix 75 Mg Tabs (Clopidogrel bisulfate) .Marland Kitchen... Take one tablet by mouth daily    Losartan Potassium 50 Mg Tabs (Losartan potassium) .Marland Kitchen... Take 1 tablet by mouth once a day    Nitrostat 0.4 Mg Subl (Nitroglycerin) .Marland Kitchen... As directed as needed  Problem # 4:  SYSTOLIC HEART FAILURE, CHRONIC (ICD-428.22) stabloe on current meds r this problem includes:    Aspirin Ec 325 Mg Tbec (Aspirin) .Marland Kitchen... Take one tablet by mouth daily    Carvedilol 12.5 Mg Tabs (Carvedilol) .Marland Kitchen... Take one tablet by mouth twice a day    Plavix 75 Mg Tabs (Clopidogrel bisulfate) .Marland Kitchen... Take one tablet by mouth daily    Losartan Potassium 50 Mg Tabs (Losartan potassium) .Marland Kitchen... Take 1 tablet by mouth once a day     Nitrostat 0.4 Mg Subl (Nitroglycerin) .Marland Kitchen... As directed as needed  Patient Instructions: 1)  Your physician has recommended you make the following change in your medication: DECREASE FAMOTIDINE 20 MG TO 1 once daily  2)  AND TAKE ASPIRIN,CYMBALTA,PLAVIX, AND LOSARTAN IN THE EVENING 3)  Your physician wants you to follow-up in: 9 MONTHS WITH DR Logan Bores will receive a reminder letter in the mail two months in advance. If you don't receive a letter, please call our office to schedule the follow-up appointment.

## 2010-10-27 NOTE — Assessment & Plan Note (Signed)
Summary: f62m    Vital Signs:  Patient profile:   55 year old female Height:      64 inches Weight:      156.50 pounds BMI:     26.96 Pulse rate:   67 / minute Pulse rhythm:   regular Resp:     18 per minute BP sitting:   114 / 60  (left arm) Cuff size:   large  Vitals Entered By: Vikki Ports (October 22, 2010 2:42 PM)  Visit Type:  6 months follow up Primary Provider:  Dr. Sherwood Gambler  CC:  No complaints.  History of Present Illness: 55 year-old woman s/p anterior MI 2010, who underwent primary PCI at that time. She had persisent severe LV dysfunction and had an ICD placed for primary SCD prevention. She presents today for follow-up evaluation.   Main complaint is fatigue after taking morning meds. She saw Dr Graciela Husbands a few days ago and switched the timing of her meds - this has helped.   No chest pain, dyspnea, edema, or palpitations. She is active and exercises regularly without exertional symptoms.   Current Medications (verified): 1)  Famotidine 20 Mg Tabs (Famotidine) .... Take 1 Tablet By Mouth Once A Day 2)  Acetaminophen 325 Mg Tabs (Acetaminophen) .... As Needed 3)  Aspirin Ec 325 Mg Tbec (Aspirin) .... Take One Tablet By Mouth Daily 4)  Cymbalta 30 Mg Cpep (Duloxetine Hcl) .... Take 1 Capsule By Mouth Once A Day 5)  Crestor 40 Mg Tabs (Rosuvastatin Calcium) .... Take 1 Tablet By Mouth Once A Day 6)  Carvedilol 12.5 Mg Tabs (Carvedilol) .... Take One Tablet By Mouth Twice A Day 7)  Plavix 75 Mg Tabs (Clopidogrel Bisulfate) .... Take One Tablet By Mouth Daily 8)  Losartan Potassium 50 Mg Tabs (Losartan Potassium) .... Take 1 Tablet By Mouth Once A Day 9)  Nitrostat 0.4 Mg Subl (Nitroglycerin) .... As Directed As Needed  Allergies (verified): No Known Drug Allergies  Past History:  Past medical history reviewed for relevance to current acute and chronic problems.  Past Medical History: Reviewed history from 01/11/2010 and no changes required. Current Problems:    ISCHEMIC CARDIOMYOPATHY (ICD-414.8), post-MI LVEF less than 30%. MYOCARDIAL INFARCTION, ACUTE, ANTEROLATERAL WALL (ICD-410.00), treated with BMS of the LAD. DYSLIPIDEMIA (ICD-272.4) GASTROESOPHAGEAL REFLUX DISEASE (ICD-530.81) TOBACCO ABUSE, HX OF (ICD-V15.82) DEPRESSION (ICD-311) DISC DISEASE, CERVICAL (ICD-722.4) S/P ICD  Review of Systems       Negative except as per HPI   Physical Exam  General:  Pt is alert and oriented, in no acute distress. HEENT: normal Neck: normal carotid upstrokes without bruits, JVP normal Lungs: CTA CV: RRR without murmur or gallop Abd: soft, NT, positive BS, no bruit, no organomegaly Ext: no clubbing, cyanosis, or edema. peripheral pulses 2+ and equal Skin: warm and dry without rash    Impression & Recommendations:  Problem # 1:  SYSTOLIC HEART FAILURE, CHRONIC (ICD-428.22) Pt is stable with NYHA Class 2 symptoms. Her main complaint is generalized fatigue after med administration. She has moved meds other than coreg away from the morning and this has helped. Will continue with current meds. We discussed spironolactone, but with NYHA Class 2 symptoms and some side effects from current drugs, will hold off for now.  Her updated medication list for this problem includes:    Aspirin Ec 325 Mg Tbec (Aspirin) .Marland Kitchen... Take one tablet by mouth daily    Carvedilol 12.5 Mg Tabs (Carvedilol) .Marland Kitchen... Take one tablet by mouth twice a  day    Plavix 75 Mg Tabs (Clopidogrel bisulfate) .Marland Kitchen... Take one tablet by mouth daily    Losartan Potassium 50 Mg Tabs (Losartan potassium) .Marland Kitchen... Take 1 tablet by mouth once a day    Nitrostat 0.4 Mg Subl (Nitroglycerin) .Marland Kitchen... As directed as needed  Problem # 2:  CAD, NATIVE VESSEL (ICD-414.01) Pt with prior MI. No angina at present. Continue current Rx.  Her updated medication list for this problem includes:    Aspirin Ec 325 Mg Tbec (Aspirin) .Marland Kitchen... Take one tablet by mouth daily    Carvedilol 12.5 Mg Tabs (Carvedilol) .Marland Kitchen...  Take one tablet by mouth twice a day    Plavix 75 Mg Tabs (Clopidogrel bisulfate) .Marland Kitchen... Take one tablet by mouth daily    Nitrostat 0.4 Mg Subl (Nitroglycerin) .Marland Kitchen... As directed as needed  Orders: EKG w/ Interpretation (93000)  Problem # 3:  DYSLIPIDEMIA (ICD-272.4) She is due for lipids and LFT's - will schedule.  Her updated medication list for this problem includes:    Crestor 40 Mg Tabs (Rosuvastatin calcium) .Marland Kitchen... Take 1 tablet by mouth once a day  CHOL: 148 (11/18/2009)   LDL: 67 (11/18/2009)   HDL: 42 (11/18/2009)   TG: 197 (11/18/2009)  Patient Instructions: 1)  Your physician recommends that you return for a FASTING LIPID, LIVER, BMP and CBC--Nothing to eat or drink after midnight, lab opens at 8:30.  2)  Your physician recommends that you continue on your current medications as directed. Please refer to the Current Medication list given to you today. 3)  Your physician wants you to follow-up in: 6 MONTHS.  You will receive a reminder letter in the mail two months in advance. If you don't receive a letter, please call our office to schedule the follow-up appointment.   Orders Added: 1)  EKG w/ Interpretation [93000]     EKG  Procedure date:  10/22/2010  Findings:      NSR, age-indeterminate septal infarct, otherwise WNL with 67 bpm.

## 2010-10-29 LAB — LIPID PANEL
HDL: 33.4 mg/dL — ABNORMAL LOW (ref >39.00)
LDL Cholesterol: 56 mg/dL (ref 0–99)
Total CHOL/HDL Ratio: 4
Triglycerides: 167 mg/dL — ABNORMAL HIGH (ref 0.0–149.0)
VLDL: 33.4 mg/dL (ref 0.0–40.0)

## 2010-10-29 LAB — HEPATIC FUNCTION PANEL
AST: 19 U/L (ref 0–37)
Albumin: 4.1 g/dL (ref 3.5–5.2)
Alkaline Phosphatase: 92 U/L (ref 39–117)
Bilirubin, Direct: 0.1 mg/dL (ref 0.0–0.3)
Total Bilirubin: 0.4 mg/dL (ref 0.3–1.2)
Total Protein: 7.1 g/dL (ref 6.0–8.3)

## 2010-11-01 LAB — LDL CHOLESTEROL, DIRECT: Direct LDL: 70.8 mg/dL

## 2010-11-04 NOTE — Cardiovascular Report (Signed)
Summary: Office Visit   Office Visit   Imported By: Roderic Ovens 10/29/2010 11:24:16  _____________________________________________________________________  External Attachment:    Type:   Image     Comment:   External Document

## 2010-12-05 LAB — GLUCOSE, CAPILLARY: Glucose-Capillary: 104 mg/dL — ABNORMAL HIGH (ref 70–99)

## 2010-12-13 ENCOUNTER — Other Ambulatory Visit: Payer: Self-pay | Admitting: Cardiovascular Disease

## 2010-12-16 NOTE — Telephone Encounter (Signed)
[address] °

## 2010-12-20 ENCOUNTER — Telehealth: Payer: Self-pay | Admitting: Cardiovascular Disease

## 2010-12-20 MED ORDER — FAMOTIDINE 20 MG PO TABS: 20.0000 mg | ORAL_TABLET | Freq: Every day | ORAL | Status: DC

## 2010-12-20 NOTE — Telephone Encounter (Signed)
Med refill sent to pharmacy  

## 2010-12-22 LAB — BASIC METABOLIC PANEL
BUN: 10 mg/dL (ref 6–23)
BUN: 6 mg/dL (ref 6–23)
BUN: 6 mg/dL (ref 6–23)
CO2: 22 mEq/L (ref 19–32)
CO2: 23 mEq/L (ref 19–32)
CO2: 23 mEq/L (ref 19–32)
CO2: 24 mEq/L (ref 19–32)
Calcium: 8 mg/dL — ABNORMAL LOW (ref 8.4–10.5)
Calcium: 8.9 mg/dL (ref 8.4–10.5)
Calcium: 8.9 mg/dL (ref 8.4–10.5)
Chloride: 104 mEq/L (ref 96–112)
Chloride: 107 mEq/L (ref 96–112)
Chloride: 99 mEq/L (ref 96–112)
Creatinine, Ser: 0.67 mg/dL (ref 0.4–1.2)
Creatinine, Ser: 0.83 mg/dL (ref 0.4–1.2)
Creatinine, Ser: 0.99 mg/dL (ref 0.4–1.2)
GFR calc Af Amer: >60 mL/min (ref >60)
GFR calc Af Amer: >60 mL/min (ref >60)
GFR calc Af Amer: >60 mL/min (ref >60)
GFR calc non Af Amer: >60 mL/min (ref >60)
GFR calc non Af Amer: >60 mL/min (ref >60)
GFR calc non Af Amer: >60 mL/min (ref >60)
GFR calc non Af Amer: >60 mL/min (ref >60)
Glucose, Bld: 102 mg/dL — ABNORMAL HIGH (ref 70–99)
Glucose, Bld: 103 mg/dL — ABNORMAL HIGH (ref 70–99)
Glucose, Bld: 107 mg/dL — ABNORMAL HIGH (ref 70–99)
Glucose, Bld: 135 mg/dL — ABNORMAL HIGH (ref 70–99)
Glucose, Bld: 145 mg/dL — ABNORMAL HIGH (ref 70–99)
Potassium: 3.2 mEq/L — ABNORMAL LOW (ref 3.5–5.1)
Potassium: 3.8 mEq/L (ref 3.5–5.1)
Potassium: 3.8 mEq/L (ref 3.5–5.1)
Potassium: 4.4 mEq/L (ref 3.5–5.1)
Potassium: 4.5 mEq/L (ref 3.5–5.1)
Sodium: 135 mEq/L (ref 135–145)
Sodium: 138 mEq/L (ref 135–145)
Sodium: 138 mEq/L (ref 135–145)
Sodium: 139 mEq/L (ref 135–145)

## 2010-12-22 LAB — DIFFERENTIAL
Basophils Absolute: 0.2 10*3/uL — ABNORMAL HIGH (ref 0.0–0.1)
Basophils Relative: 1 % (ref 0–1)
Monocytes Relative: 12 % (ref 3–12)
Neutro Abs: 12.7 10*3/uL — ABNORMAL HIGH (ref 1.7–7.7)
Neutrophils Relative %: 71 % (ref 43–77)

## 2010-12-22 LAB — CARDIAC PANEL(CRET KIN+CKTOT+MB+TROPI)
CK, MB: 155.2 ng/mL — ABNORMAL HIGH (ref 0.3–4.0)
Relative Index: 11.8 — ABNORMAL HIGH (ref 0.0–2.5)
Total CK: 1318 U/L — ABNORMAL HIGH (ref 7–177)
Total CK: 4712 U/L — ABNORMAL HIGH (ref 7–177)
Troponin I: >100 ng/mL (ref 0.00–0.06)

## 2010-12-22 LAB — CBC
HCT: 33.6 % — ABNORMAL LOW (ref 36.0–46.0)
HCT: 36.2 % (ref 36.0–46.0)
HCT: 40.7 % (ref 36.0–46.0)
Hemoglobin: 11.6 g/dL — ABNORMAL LOW (ref 12.0–15.0)
Hemoglobin: 12 g/dL (ref 12.0–15.0)
Hemoglobin: 13.7 g/dL (ref 12.0–15.0)
MCHC: 33.7 g/dL (ref 30.0–36.0)
MCHC: 33.7 g/dL (ref 30.0–36.0)
MCHC: 34.2 g/dL (ref 30.0–36.0)
MCHC: 34.5 g/dL (ref 30.0–36.0)
MCV: 89.1 fL (ref 78.0–100.0)
MCV: 89.3 fL (ref 78.0–100.0)
MCV: 89.5 fL (ref 78.0–100.0)
Platelets: 210 10*3/uL (ref 150–400)
Platelets: 259 10*3/uL (ref 150–400)
RBC: 3.93 MIL/uL (ref 3.87–5.11)
RBC: 3.98 MIL/uL (ref 3.87–5.11)
RBC: 4.55 MIL/uL (ref 3.87–5.11)
RDW: 12.6 % (ref 11.5–15.5)
RDW: 12.9 % (ref 11.5–15.5)
WBC: 13.3 10*3/uL — ABNORMAL HIGH (ref 4.0–10.5)
WBC: 19.4 10*3/uL — ABNORMAL HIGH (ref 4.0–10.5)

## 2010-12-22 LAB — POCT I-STAT, CHEM 8
BUN: 8 mg/dL (ref 6–23)
Chloride: 106 mEq/L (ref 96–112)
Glucose, Bld: 136 mg/dL — ABNORMAL HIGH (ref 70–99)
HCT: 42 % (ref 36.0–46.0)
Potassium: 3.4 mEq/L — ABNORMAL LOW (ref 3.5–5.1)

## 2010-12-22 LAB — LIPID PANEL: HDL: 27 mg/dL — ABNORMAL LOW (ref >39)

## 2010-12-22 LAB — POCT CARDIAC MARKERS
CKMB, poc: 2.1 ng/mL (ref 1.0–8.0)
Troponin i, poc: <0.05 ng/mL (ref 0.00–0.09)

## 2010-12-22 LAB — APTT: aPTT: 30 seconds (ref 24–37)

## 2010-12-22 LAB — PROTIME-INR: INR: 1.12 (ref 0.00–1.49)

## 2010-12-30 ENCOUNTER — Other Ambulatory Visit: Payer: Self-pay | Admitting: Cardiovascular Disease

## 2011-01-17 ENCOUNTER — Other Ambulatory Visit: Payer: Self-pay | Admitting: Cardiovascular Disease

## 2011-01-17 NOTE — Telephone Encounter (Signed)
Our office has filled this medication in the past.  Okay to fill times one year.

## 2011-01-19 MED ORDER — FAMOTIDINE 20 MG PO TABS: 20.0000 mg | ORAL_TABLET | Freq: Every day | ORAL | Status: DC

## 2011-01-20 ENCOUNTER — Other Ambulatory Visit: Payer: Self-pay

## 2011-01-20 ENCOUNTER — Ambulatory Visit (INDEPENDENT_AMBULATORY_CARE_PROVIDER_SITE_OTHER): Payer: Medicare Other | Admitting: *Deleted

## 2011-01-20 DIAGNOSIS — I428 Other cardiomyopathies: Secondary | ICD-10-CM

## 2011-01-20 DIAGNOSIS — I5022 Chronic systolic (congestive) heart failure: Secondary | ICD-10-CM

## 2011-01-26 ENCOUNTER — Encounter: Payer: Self-pay | Admitting: *Deleted

## 2011-01-27 NOTE — Progress Notes (Signed)
icd remote w/icm  

## 2011-02-01 NOTE — Letter (Signed)
October 26, 2009     RE:  CANDIE, GINTZ  MRN:  [id number]  /  DOB:  10-Jun-1956   To Whom It May Concern:   Dreanna Kyllo suffered a large anterior wall myocardial infarction.  Ejection fractions were severely depressed with an ejection fraction of  20-30%.  Based on data from the [facility], this represents a  potential high risk for sudden cardiac death in the 40 days prior to  appropriate implantable defibrillator implantation, in the event that  the LV function remained depressed.  Because of that, she was subjected  to [employer] protection.   If you have any questions about this, I will be glad to answer them  further.    Sincerely,      Duke Salvia, MD, [facility]  Electronically Signed    SCK/MedQ  DD: 10/26/2009  DT: 10/27/2009  Job #: [id number]

## 2011-02-04 NOTE — Op Note (Signed)
[name], [name]                ACCOUNT NO.:  [id number]   MEDICAL RECORD NO.:  [id number]          PATIENT TYPE:  AMB   LOCATION:  DAY                           FACILITY:  [facility]   PHYSICIAN:  Barbaraann Barthel, M.D. DATE OF BIRTH:  [birth year]   DATE OF PROCEDURE:  02/21/2005  DATE OF DISCHARGE:                                 OPERATIVE REPORT   DIAGNOSIS:  Nondiagnostic mammogram, right breast, negative fine needle  aspiration and a palpable 2-cm hard mass.   PROCEDURE:  Right partial mastectomy.   SPECIMEN:  Right breast tissue.   NOTE:  This is a 55 year old white female who had a mass in her right breast  at approximately the 11 o'clock position. This felt solid on examination,  and negative fine needle aspiration was performed without any fluid. The  patient had previous cystic disease in the left breast was which had been  aspirated with no recurrence of her left breast cyst. Mammograms revealed a  left breast cyst but no mention was made of this hard 2-cm mass in her right  breast.   The patient had undergone a vaginal hysterectomy in the past, and there was  no family history of breast carcinoma. She retains her ovaries.   We discussed surgery, excisional biopsy for diagnosis. We discussed  complications not limited to but including bleeding, infection, the  possibility that further surgery may be required. Informed consent was  obtained.   GROSS OPERATIVE FINDINGS:  The patient had a hard complex cyst within the  breast with pasty material within it. This was obviously too thick to be  aspirated with a needle. The final pathology is pending.   TECHNIQUE:  The patient was placed in supine position. After the adequate  administration of general anesthesia via endotracheal intubation, her right  hemithorax was prepped with Betadine solution and draped in the usual  manner. A periareolar incision was carried out proximally around the 11  o'clock position of the left  breast. We removed the complex cyst without any  problem. We sent this to pathology, and final pathology is pending. The  breast tissue was then irrigated with normal saline solution. Bleeding was  controlled with a cautery device. The breast tissue was approximated 3-0  Polysorb, and then the periareolar complex was cosmetically closed with a  subcuticular 5-0 Polysorb suture. Steri-Strips and Neosporin and sterile  dressing was applied. Prior to closure, all sponge, needle, and instrument  counts were found to be correct. Estimated blood loss was minimal. The  patient was replaced with crystalloids. No drains were placed. There were no  complications.       WB/MEDQ  D:  02/21/2005  T:  02/21/2005  Job:  [id number]   cc:   Kirk Ruths, M.D.  [address]  [place]  [place] [address]  Fax: [phone]

## 2011-02-04 NOTE — Op Note (Signed)
[name], [name]                ACCOUNT NO.:  [id number]   MEDICAL RECORD NO.:  [id number]          PATIENT TYPE:  INP   LOCATION:  2899                         FACILITY:  [facility]   PHYSICIAN:  Clydene Fake, M.D.  DATE OF BIRTH:  1956/09/15   DATE OF PROCEDURE:  DATE OF DISCHARGE:                                 OPERATIVE REPORT   PREOPERATIVE DIAGNOSIS:  Herniated nucleus pulposus, spondylosis, C5-6 with  left-sided radiculopathy.   POSTOPERATIVE DIAGNOSIS:  Herniated nucleus pulposus, spondylosis, C5-6 with  left-sided radiculopathy.   OPERATION PERFORMED:  Anterior cervical decompression and diskectomy and  fusion at C5-6 with LifeNet bone and __________ anterior cervical plate.   SURGEON:  Clydene Fake, M.D.   ASSISTANT:  Coletta Memos, M.D.   ANESTHESIA:  General endotracheal.   ESTIMATED BLOOD LOSS:  Minimal.   BLOOD REPLACED:  None.   DRAINS:  None.   COMPLICATIONS:  None.   INDICATIONS FOR PROCEDURE:  The patient is a 55 year old woman who has been  having neck and left arm pain and numbness.  An MRI showed spondylitic  changes bilaterally with disk protrusion causing canal stenosis and  biforaminal narrowing.  Patient brought in for decompression and fusion.   DESCRIPTION OF PROCEDURE:  The patient was brought to the operating room.  General anesthesia induced.  Patient was placed in five pounds halter  traction, prepped and draped in sterile fashion.  Site of incision was  injected with 10 mL of 1% lidocaine with epinephrine.  Incision was then  made from the midline to the anterior border of the sternocleidomastoid  muscle on the left side.  Neck incision taken down through the skin to the  platysma.  Hemostasis obtained with Bovie cautery.  The platysma was incised  with the Bovie and blunt dissection was taken through the anterior cervical  fascia and anterior cervical spine.  Needle was placed in the interspace  __________  with a large osteophyte.   X-rays were obtained confirming this  was the 5-6 level.  The disk space was then incised with a 15 blade, partial  diskectomy was performed as the needle was removed, the longus colli muscles  were reflected laterally each side.  The self-retaining retractor was  placed.  Disk space found again, incised with 15 blade and partial  diskectomy performed with pituitary rongeurs.  Distraction pins were placed  in the C5 and 6 interspace distracted.  Kerrison punches then used to remove  anterior osteophytes.  Diskectomy was then continued with pituitary rongeurs  and curettes.  Microscope was brought in for microdissection.  As we  continued the diskectomy, 1 and 2 mm Kerrison punches were used to remove  posterior disk, ligaments and spurs.  We had decompression of the central  canal and then performing bilateral foraminotomies, decompressed the C6  roots. When we were finished, we had good central and lateral decompression.  The nerve roots were decompressed and pulsatile.  We curetted off the  cartilaginous end plates and then measured the height of the disk to be 6  mm.  A 6 mm LifeNet  allograft bone was then tapped into place, countersunk  about 1 mm.  We checked posterior to the graft and there was plenty of room  between bone graft and dura.  Distraction pins removed, weight was removed  from the traction.  Hemostasis was obtained with Gelfoam and Thrombin. Wound  was irrigated with antibiotic solution.  Bone was in good position and Eagle  anterior cervical plate was placed over the anterior cervical spine.  Two  screws placed in the C6, two in the C5.  These were tightened down.  Lateral  x-rays were obtained showing good position of plate, screws, interbody  fusion at 5-6.  Retractor was removed.  Wound was irrigated with antibiotic  solution.  Hemostasis obtained with bipolar coagulation.  We had good  hemostasis.  The platysma was closed with 3-0 Vicryl interrupted sutures,  the  subcutaneous tissue closed with the same, the skin closed with Benzoin  and Steri-Strips.  Dry sterile dressing was applied.  The patient was  awakened from anesthesia, placed in a soft cervical collar and transferred  to recovery room in stable condition.       JRH/MEDQ  D:  04/14/2005  T:  04/14/2005  Job:  [id number]

## 2011-02-05 ENCOUNTER — Other Ambulatory Visit: Payer: Self-pay | Admitting: Internal Medicine

## 2011-02-08 ENCOUNTER — Other Ambulatory Visit: Payer: Self-pay | Admitting: Cardiovascular Disease

## 2011-02-27 ENCOUNTER — Other Ambulatory Visit: Payer: Self-pay | Admitting: Cardiovascular Disease

## 2011-03-07 ENCOUNTER — Telehealth: Payer: Self-pay | Admitting: Cardiovascular Disease

## 2011-03-07 MED ORDER — CLOPIDOGREL BISULFATE 75 MG PO TABS: 75.0000 mg | ORAL_TABLET | Freq: Every day | ORAL | Status: DC

## 2011-03-07 NOTE — Telephone Encounter (Signed)
PT NEEDS PLAVIX REFILL [facility] # [phone]

## 2011-03-08 ENCOUNTER — Other Ambulatory Visit: Payer: Self-pay | Admitting: Internal Medicine

## 2011-03-17 ENCOUNTER — Encounter: Payer: Self-pay | Admitting: Cardiovascular Disease

## 2011-04-21 ENCOUNTER — Encounter: Payer: Medicare Other | Admitting: *Deleted

## 2011-04-26 ENCOUNTER — Encounter: Payer: Self-pay | Admitting: *Deleted

## 2011-04-29 ENCOUNTER — Ambulatory Visit (INDEPENDENT_AMBULATORY_CARE_PROVIDER_SITE_OTHER): Payer: Medicare Other | Admitting: *Deleted

## 2011-04-29 DIAGNOSIS — I5022 Chronic systolic (congestive) heart failure: Secondary | ICD-10-CM

## 2011-04-29 DIAGNOSIS — I428 Other cardiomyopathies: Secondary | ICD-10-CM

## 2011-04-30 ENCOUNTER — Other Ambulatory Visit: Payer: Self-pay | Admitting: Internal Medicine

## 2011-04-30 ENCOUNTER — Encounter: Payer: Self-pay | Admitting: Internal Medicine

## 2011-05-02 ENCOUNTER — Encounter: Payer: Self-pay | Admitting: Cardiovascular Disease

## 2011-05-02 ENCOUNTER — Ambulatory Visit (INDEPENDENT_AMBULATORY_CARE_PROVIDER_SITE_OTHER): Payer: Medicare Other | Admitting: Cardiovascular Disease

## 2011-05-02 VITALS — BP 114/74 | HR 65 | Resp 12 | Ht 64.0 in | Wt 156.0 lb

## 2011-05-02 DIAGNOSIS — I2510 Atherosclerotic heart disease of native coronary artery without angina pectoris: Secondary | ICD-10-CM

## 2011-05-02 DIAGNOSIS — I5022 Chronic systolic (congestive) heart failure: Secondary | ICD-10-CM

## 2011-05-02 DIAGNOSIS — E785 Hyperlipidemia, unspecified: Secondary | ICD-10-CM

## 2011-05-02 MED ORDER — ASPIRIN 81 MG PO TBEC: 81.0000 mg | DELAYED_RELEASE_TABLET | Freq: Every day | ORAL | Status: DC

## 2011-05-02 MED ORDER — PANTOPRAZOLE SODIUM 40 MG PO TBEC: 40.0000 mg | DELAYED_RELEASE_TABLET | Freq: Every day | ORAL | Status: DC

## 2011-05-02 NOTE — Assessment & Plan Note (Signed)
Lipids are at goal with an LDL less than 70.  Repeat labs will be drawn at the time of her next office visit in 6 months.

## 2011-05-02 NOTE — Assessment & Plan Note (Signed)
The patient is stable without angina. She will continue on her current medical program. I like to see her back in followup in 6 months.

## 2011-05-02 NOTE — Patient Instructions (Signed)
Your physician wants you to follow-up in:6 months with Dr. Excell Seltzer and have fasting lab work done same day.  You will receive a reminder letter in the mail two months in advance. If you don't receive a letter, please call our office to schedule the follow-up appointment.  Your physician has recommended you make the following change in your medication: Decrease aspirin to 81 mg by mouth daily. Stop pepcid. Start Protonix 40 mg by mouth daily.

## 2011-05-02 NOTE — Progress Notes (Signed)
HPI:  As a 55 year old woman presenting for followup evaluation. She has a history of anterior MI with residual severe LV dysfunction. The patient underwent primary PCI at the time of her initial event. She has subsequently undergone defibrillator placement. Clinically she has done very well. She denies symptoms of chest pain or dyspnea. She continues to walk on a daily basis for exercise. She also continues to abstain from tobacco use. She denies claudication symptoms, orthopnea, PND, or palpitations. She had complained of fatigue after her morning medications but this is resolved since she has adjusted the timing of her various medicines.  Outpatient Encounter Prescriptions as of 05/02/2011  Medication Sig Dispense Refill  . acetaminophen (TYLENOL) 325 MG tablet Take 650 mg by mouth every 6 (six) hours as needed.        Marland Kitchen aspirin EC 325 MG tablet Take 325 mg by mouth daily.        . carvedilol (COREG) 12.5 MG tablet TAKE 1 TABLET BY MOUTH TWICE DAILY  60 tablet  10  . clopidogrel (PLAVIX) 75 MG tablet Take 1 tablet (75 mg total) by mouth daily.  30 tablet  9  . CRESTOR 40 MG tablet TAKE 1 TABLET BY MOUTH EVERY EVENING  30 tablet  5  . DULoxetine (CYMBALTA) 30 MG capsule Take 30 mg by mouth daily.        . famotidine (PEPCID) 20 MG tablet Take 1 tablet (20 mg total) by mouth daily.  30 tablet  11  . losartan (COZAAR) 50 MG tablet TAKE 1 TABLET BY MOUTH EVERY DAY  30 tablet  5  . nitroGLYCERIN (NITROSTAT) 0.4 MG SL tablet Place 0.4 mg under the tongue every 5 (five) minutes as needed. As directed as needed       . DISCONTD: famotidine (PEPCID) 20 MG tablet TAKE 1 TABLET BY MOUTH TWICE DAILY  60 tablet  11    No Known Allergies  Past Medical History  Diagnosis Date  . Ischemic cardiomyopathy     post-MI LVEF less than 30%  . Acute MI, anterior wall     treated with BMS to LAD  . Dyslipidemia   . GERD (gastroesophageal reflux disease)   . Depression   . Cervical disc disease     ROS:  Negative except as per HPI  BP 114/74  Pulse 65  Resp 12  Ht 5\' 4"  (1.626 m)  Wt 156 lb (70.761 kg)  BMI 26.78 kg/m2  PHYSICAL EXAM: Pt is alert and oriented, NAD HEENT: normal Neck: JVP - normal, carotids 2+= without bruits Lungs: CTA bilaterally CV: RRR without murmur or gallop Abd: soft, NT, Positive BS, no hepatomegaly Ext: no C/C/E, distal pulses intact and equal Skin: warm/dry no rash  EKG:  Normal sinus rhythm heart rate 65 beats per minute, age indeterminate septal infarct  ASSESSMENT AND PLAN:

## 2011-05-02 NOTE — Assessment & Plan Note (Signed)
She is tolerating the combination of Cozaar and carvedilol. There is no evidence of volume overload. The patient's left ventricular ejection fraction remains in the 30-35% range. Her symptoms are New York Heart Association class 1-2.

## 2011-05-06 LAB — REMOTE ICD DEVICE
CHARGE TIME: 8.538 s
DEV-0020ICD: NEGATIVE
RV LEAD AMPLITUDE: 14.125 mv
TOT-0001: 1
TZAT-0004SLOWVT: 8
TZAT-0005SLOWVT: 88 pct
TZAT-0012SLOWVT: 200 ms
TZAT-0013SLOWVT: 3
TZAT-0018FASTVT: NEGATIVE
TZAT-0019FASTVT: 8 V
TZON-0003SLOWVT: 340 ms
TZON-0003VSLOWVT: 450 ms
TZON-0004SLOWVT: 40
TZST-0001FASTVT: 2
TZST-0001FASTVT: 6
TZST-0001SLOWVT: 2
TZST-0001SLOWVT: 5
TZST-0002FASTVT: NEGATIVE
TZST-0002FASTVT: NEGATIVE
TZST-0003SLOWVT: 35 J
TZST-0003SLOWVT: 35 J
VENTRICULAR PACING ICD: 0 pct
VF: 0

## 2011-05-10 NOTE — Progress Notes (Signed)
icd remote check w/icm  

## 2011-05-18 ENCOUNTER — Encounter: Payer: Self-pay | Admitting: *Deleted

## 2011-07-27 ENCOUNTER — Other Ambulatory Visit: Payer: Self-pay | Admitting: Cardiovascular Disease

## 2011-08-04 ENCOUNTER — Other Ambulatory Visit: Payer: Self-pay | Admitting: Internal Medicine

## 2011-08-04 ENCOUNTER — Ambulatory Visit (INDEPENDENT_AMBULATORY_CARE_PROVIDER_SITE_OTHER): Payer: Medicare Other | Admitting: *Deleted

## 2011-08-04 ENCOUNTER — Encounter: Payer: Self-pay | Admitting: Internal Medicine

## 2011-08-04 DIAGNOSIS — I428 Other cardiomyopathies: Secondary | ICD-10-CM

## 2011-08-04 DIAGNOSIS — I5022 Chronic systolic (congestive) heart failure: Secondary | ICD-10-CM

## 2011-08-05 LAB — REMOTE ICD DEVICE
BATTERY VOLTAGE: 3.1355 V
BRDY-0002RV: 40 {beats}/min
CHARGE TIME: 8.868 s
DEV-0020ICD: NEGATIVE
FVT: 0
HV IMPEDENCE: 79 Ohm
PACEART VT: 0
RV LEAD AMPLITUDE: 15.4 mv
RV LEAD IMPEDENCE ICD: 703 Ohm
TOT-0001: 1
TOT-0002: 0
TOT-0006: 20110110000000
TZAT-0001FASTVT: 1
TZAT-0001SLOWVT: 1
TZAT-0002FASTVT: NEGATIVE
TZAT-0004SLOWVT: 8
TZAT-0005SLOWVT: 88 pct
TZAT-0011SLOWVT: 10 ms
TZAT-0012FASTVT: 200 ms
TZAT-0012SLOWVT: 200 ms
TZAT-0013SLOWVT: 3
TZAT-0018FASTVT: NEGATIVE
TZAT-0018SLOWVT: NEGATIVE
TZAT-0019FASTVT: 8 V
TZAT-0019SLOWVT: 8 V
TZAT-0020FASTVT: 1.5 ms
TZAT-0020SLOWVT: 1.5 ms
TZON-0003SLOWVT: 340 ms
TZON-0003VSLOWVT: 450 ms
TZON-0004SLOWVT: 40
TZON-0004VSLOWVT: 20
TZON-0005SLOWVT: 12
TZST-0001FASTVT: 2
TZST-0001FASTVT: 3
TZST-0001FASTVT: 4
TZST-0001FASTVT: 5
TZST-0001FASTVT: 6
TZST-0001SLOWVT: 2
TZST-0001SLOWVT: 3
TZST-0001SLOWVT: 4
TZST-0001SLOWVT: 5
TZST-0001SLOWVT: 6
TZST-0002FASTVT: NEGATIVE
TZST-0002FASTVT: NEGATIVE
TZST-0002FASTVT: NEGATIVE
TZST-0002FASTVT: NEGATIVE
TZST-0002FASTVT: NEGATIVE
TZST-0003SLOWVT: 20 J
TZST-0003SLOWVT: 35 J
TZST-0003SLOWVT: 35 J
TZST-0003SLOWVT: 35 J
TZST-0003SLOWVT: 35 J
VENTRICULAR PACING ICD: 0 pct
VF: 0

## 2011-08-10 NOTE — Progress Notes (Signed)
Remote icd w/icm  

## 2011-08-25 ENCOUNTER — Telehealth: Payer: Self-pay | Admitting: Cardiovascular Disease

## 2011-08-25 ENCOUNTER — Encounter: Payer: Self-pay | Admitting: *Deleted

## 2011-08-25 DIAGNOSIS — E7800 Pure hypercholesterolemia, unspecified: Secondary | ICD-10-CM

## 2011-08-25 NOTE — Telephone Encounter (Signed)
I spoke with the pt and made her aware that she can have labs drawn 10/31/11 prior to her appt. Orders placed.

## 2011-08-25 NOTE — Telephone Encounter (Signed)
New Msg: Pt calling to schedule appt for Dr. Excell Seltzer in February. Appt say pt needs blood work however there is no order for blood work. Please put in order for pt blood work and return pt call to discuss.

## 2011-08-28 ENCOUNTER — Other Ambulatory Visit: Payer: Self-pay | Admitting: Cardiovascular Disease

## 2011-10-01 IMAGING — CR DG CHEST 1V PORT
1 series · 1 of 1 positions shown · non-contrast
Comparison: 07/23/2009

CLINICAL DATA: Post pacemaker placement.

PORTABLE CHEST - 1 VIEW

[view not recorded]
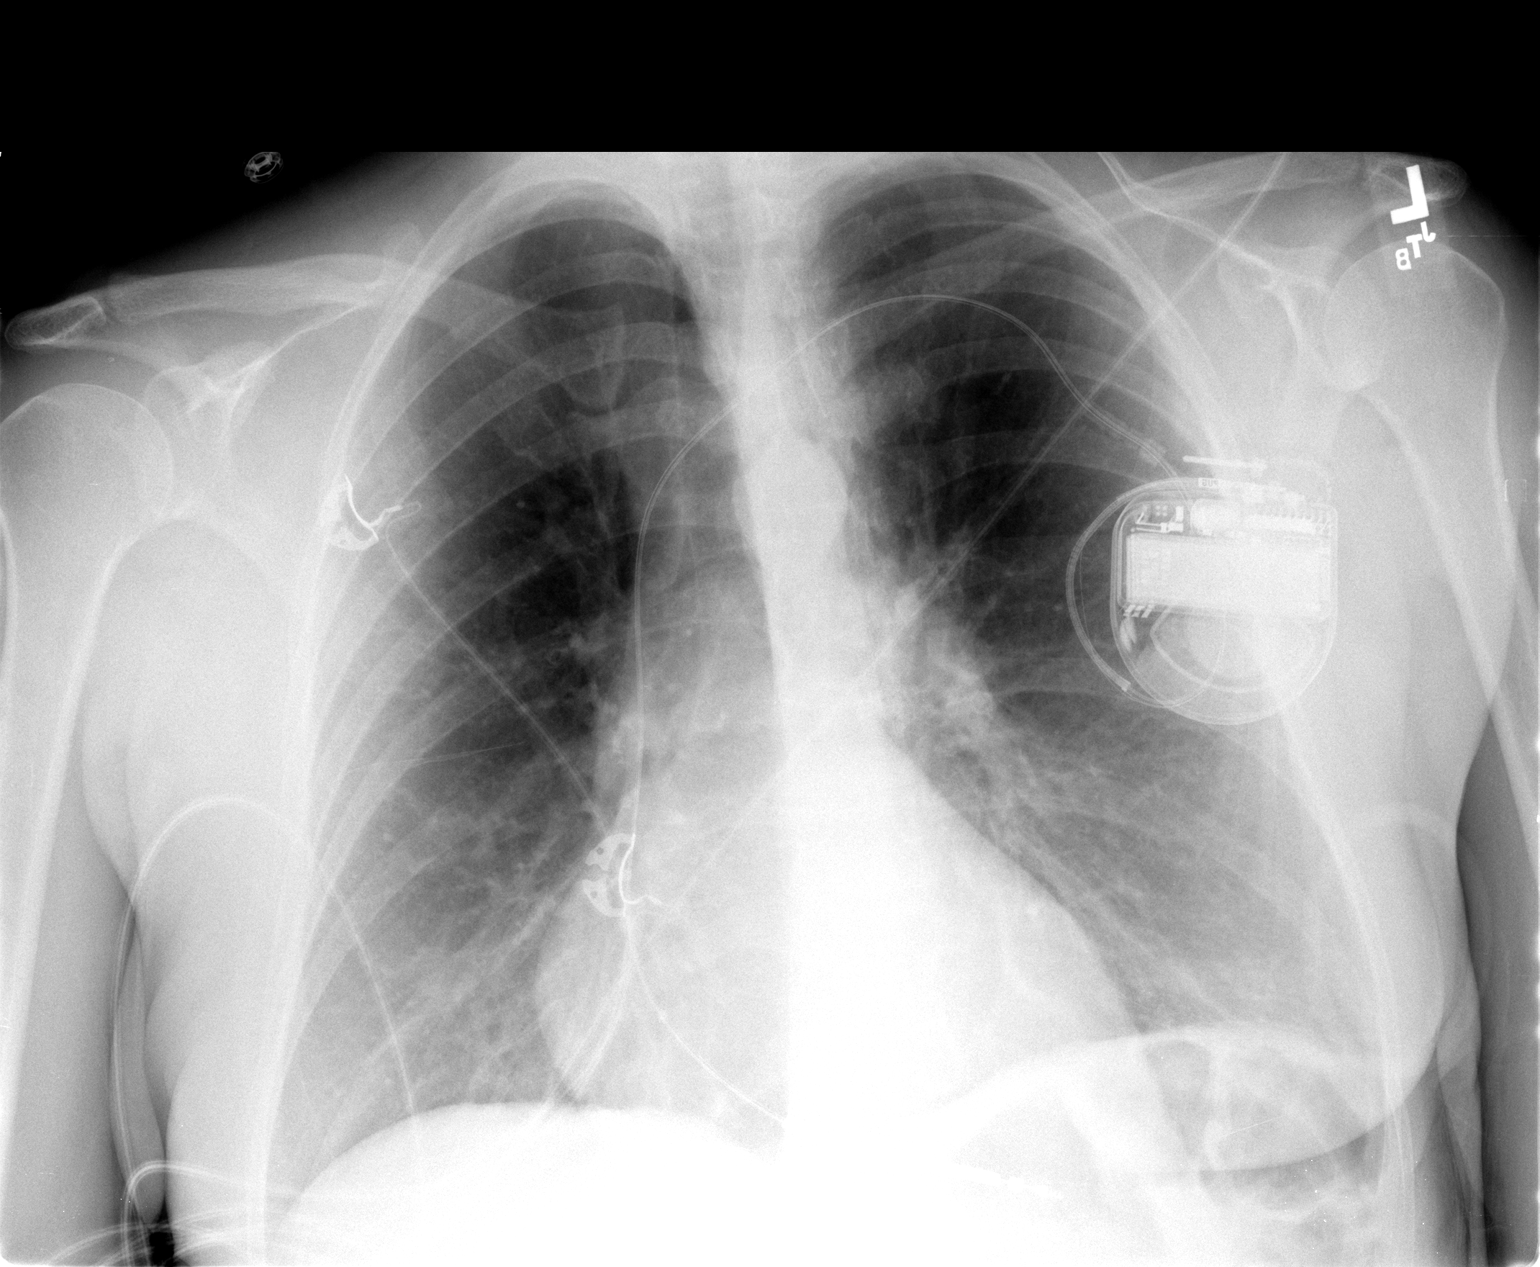

[1 of 1 positions shown; findings below may reference images not displayed]

FINDINGS: A permanent transvenous pacer is in place from a left
subclavian approach.  Tip lies in the RV.  No pneumothorax.  Normal
cardiac size with clear lung fields.  Slight improvement aeration
with some clearing of basilar atelectasis/edema from priors.
IMPRESSION: Satisfactory pacemaker placement.  No pneumothorax.

## 2011-10-31 ENCOUNTER — Other Ambulatory Visit (INDEPENDENT_AMBULATORY_CARE_PROVIDER_SITE_OTHER): Payer: Medicare Other | Admitting: *Deleted

## 2011-10-31 DIAGNOSIS — E7800 Pure hypercholesterolemia, unspecified: Secondary | ICD-10-CM

## 2011-10-31 LAB — BASIC METABOLIC PANEL
CO2: 24 mEq/L (ref 19–32)
Calcium: 8.8 mg/dL (ref 8.4–10.5)
GFR: 79.49 mL/min (ref >60.00)
Potassium: 3.7 mEq/L (ref 3.5–5.1)
Sodium: 137 mEq/L (ref 135–145)

## 2011-10-31 LAB — LIPID PANEL
HDL: 39.8 mg/dL (ref >39.00)
Total CHOL/HDL Ratio: 3
Triglycerides: 113 mg/dL (ref 0.0–149.0)
VLDL: 22.6 mg/dL (ref 0.0–40.0)

## 2011-10-31 LAB — HEPATIC FUNCTION PANEL
AST: 21 U/L (ref 0–37)
Alkaline Phosphatase: 96 U/L (ref 39–117)
Bilirubin, Direct: 0.1 mg/dL (ref 0.0–0.3)
Total Protein: 6.9 g/dL (ref 6.0–8.3)

## 2011-11-02 ENCOUNTER — Encounter: Payer: Self-pay | Admitting: Cardiovascular Disease

## 2011-11-02 ENCOUNTER — Ambulatory Visit (INDEPENDENT_AMBULATORY_CARE_PROVIDER_SITE_OTHER): Payer: Medicare Other | Admitting: Cardiovascular Disease

## 2011-11-02 VITALS — BP 110/62 | HR 66 | Ht 64.0 in | Wt 158.4 lb

## 2011-11-02 DIAGNOSIS — I5022 Chronic systolic (congestive) heart failure: Secondary | ICD-10-CM

## 2011-11-02 DIAGNOSIS — I2510 Atherosclerotic heart disease of native coronary artery without angina pectoris: Secondary | ICD-10-CM

## 2011-11-02 DIAGNOSIS — E785 Hyperlipidemia, unspecified: Secondary | ICD-10-CM

## 2011-11-02 NOTE — Assessment & Plan Note (Signed)
The patient is stable without angina. We will continue her current medical program without changes.

## 2011-11-02 NOTE — Assessment & Plan Note (Signed)
Recent lab work was reviewed and her lipid panel was excellent with an LDL of 58 and an HDL of 40. Her total cholesterol is 120. She is on a statin drug with Crestor 40 mg daily.

## 2011-11-02 NOTE — Patient Instructions (Signed)
Your physician wants you to follow-up in: 6 MONTHS.  You will receive a reminder letter in the mail two months in advance. If you don't receive a letter, please call our office to schedule the follow-up appointment.  Your physician recommends that you continue on your current medications as directed. Please refer to the Current Medication list given to you today.  

## 2011-11-02 NOTE — Progress Notes (Signed)
HPI:  This is a 56 year old woman presented for followup evaluation.  The patient has severe ischemic cardiomyopathy following an anterior wall MI. She was treated with primary PCI but did not have good recovery of her LV function. She ultimately underwent ICD placement. She's never had an ICD discharge. She feels very well. Patient walks regularly for exercise. She has no chest pain or pressure. She denies dyspnea, palpitations, lightheadedness, or edema.  Outpatient Encounter Prescriptions as of 11/02/2011  Medication Sig Dispense Refill  . acetaminophen (TYLENOL) 325 MG tablet Take 650 mg by mouth every 6 (six) hours as needed.        Marland Kitchen aspirin EC 81 MG EC tablet Take 1 tablet (81 mg total) by mouth daily.  30 tablet  6  . carvedilol (COREG) 12.5 MG tablet TAKE 1 TABLET BY MOUTH TWICE DAILY  60 tablet  10  . clopidogrel (PLAVIX) 75 MG tablet Take 1 tablet (75 mg total) by mouth daily.  30 tablet  9  . CRESTOR 40 MG tablet TAKE 1 TABLET BY MOUTH EVERY EVENING  30 tablet  5  . DULoxetine (CYMBALTA) 30 MG capsule Take 30 mg by mouth daily.        Marland Kitchen losartan (COZAAR) 50 MG tablet TAKE 1 TABLET BY MOUTH EVERY DAY  30 tablet  5  . nitroGLYCERIN (NITROSTAT) 0.4 MG SL tablet Place 0.4 mg under the tongue every 5 (five) minutes as needed. As directed as needed       . pantoprazole (PROTONIX) 40 MG tablet Take 1 tablet (40 mg total) by mouth daily.  30 tablet  11    No Known Allergies  Past Medical History  Diagnosis Date  . Ischemic cardiomyopathy     post-MI LVEF less than 30%  . Acute MI, anterior wall     treated with BMS to LAD  . Dyslipidemia   . GERD (gastroesophageal reflux disease)   . Depression   . Cervical disc disease     ROS: Negative except as per HPI  BP 110/62  Pulse 66  Ht 5\' 4"  (1.626 m)  Wt 71.85 kg (158 lb 6.4 oz)  BMI 27.19 kg/m2  PHYSICAL EXAM: Pt is alert and oriented, NAD HEENT: normal Neck: JVP - normal, carotids 2+= without bruits Lungs: CTA  bilaterally CV: RRR without murmur or gallop Abd: soft, NT, Positive BS, no hepatomegaly Ext: no C/C/E, distal pulses intact and equal Skin: warm/dry no rash  EKG:  Normal sinus rhythm 66 beats per minute, septal infarct age undetermined.  ASSESSMENT AND PLAN:

## 2011-11-02 NOTE — Assessment & Plan Note (Signed)
Stable with New York Heart Association class 1-2 symptoms. She is on appropriate medical therapy with a combination of carvedilol and losartan.

## 2011-11-11 ENCOUNTER — Encounter: Payer: Self-pay | Admitting: Internal Medicine

## 2011-11-11 ENCOUNTER — Encounter: Payer: Medicare Other | Admitting: Internal Medicine

## 2011-12-27 ENCOUNTER — Encounter: Payer: Self-pay | Admitting: Internal Medicine

## 2011-12-27 ENCOUNTER — Ambulatory Visit (INDEPENDENT_AMBULATORY_CARE_PROVIDER_SITE_OTHER): Payer: Medicare Other | Admitting: Internal Medicine

## 2011-12-27 VITALS — BP 104/62 | HR 62 | Ht 64.0 in | Wt 155.8 lb

## 2011-12-27 DIAGNOSIS — I255 Ischemic cardiomyopathy: Secondary | ICD-10-CM | POA: Insufficient documentation

## 2011-12-27 DIAGNOSIS — Z95810 Presence of automatic (implantable) cardiac defibrillator: Secondary | ICD-10-CM

## 2011-12-27 DIAGNOSIS — I5022 Chronic systolic (congestive) heart failure: Secondary | ICD-10-CM

## 2011-12-27 DIAGNOSIS — I2589 Other forms of chronic ischemic heart disease: Secondary | ICD-10-CM

## 2011-12-27 LAB — ICD DEVICE OBSERVATION
BATTERY VOLTAGE: 3.1287 V
BRDY-0002RV: 40 {beats}/min
FVT: 0
HV IMPEDENCE: 66 Ohm
PACEART VT: 0
RV LEAD IMPEDENCE ICD: 703 Ohm
RV LEAD THRESHOLD: 0.75 V
TOT-0006: 20110110000000
TZAT-0001FASTVT: 1
TZAT-0001SLOWVT: 1
TZAT-0002FASTVT: NEGATIVE
TZAT-0013SLOWVT: 3
TZAT-0018SLOWVT: NEGATIVE
TZAT-0019FASTVT: 8 V
TZAT-0019SLOWVT: 8 V
TZAT-0020SLOWVT: 1.5 ms
TZON-0003VSLOWVT: 450 ms
TZON-0004SLOWVT: 40
TZON-0004VSLOWVT: 20
TZON-0005SLOWVT: 12
TZST-0001FASTVT: 3
TZST-0001FASTVT: 4
TZST-0001FASTVT: 5
TZST-0001SLOWVT: 2
TZST-0001SLOWVT: 5
TZST-0001SLOWVT: 6
TZST-0002FASTVT: NEGATIVE
TZST-0002FASTVT: NEGATIVE
TZST-0002FASTVT: NEGATIVE
TZST-0002FASTVT: NEGATIVE
TZST-0003SLOWVT: 35 J
TZST-0003SLOWVT: 35 J
TZST-0003SLOWVT: 35 J

## 2011-12-27 NOTE — Assessment & Plan Note (Signed)
Stable  As above

## 2011-12-27 NOTE — Progress Notes (Signed)
  HPI  [name] [name] is a 56 y.o. female seen in followup for an ICD implanted for primary prevention OCT 2011 . She is a 56 year-old woman s/p anterior MI 2010, who underwent primary PCI at that time. She had persisent severe LV dysfunction  She denies chest pain or shortness of breath. there have been no device discharges    .    Past Medical History  Diagnosis Date  . Ischemic cardiomyopathy     post-MI LVEF less than 30%  . Acute MI, anterior wall     treated with BMS to LAD  . Dyslipidemia   . GERD (gastroesophageal reflux disease)   . Depression   . Cervical disc disease     Past Surgical History  Procedure Date  . Cervical disc surgery   . Partial right masectomy   . Coronary angioplasty with stent placement   . Icd implanted     Current Outpatient Prescriptions  Medication Sig Dispense Refill  . acetaminophen (TYLENOL) 325 MG tablet Take 650 mg by mouth every 6 (six) hours as needed.        Marland Kitchen aspirin EC 81 MG EC tablet Take 1 tablet (81 mg total) by mouth daily.  30 tablet  6  . carvedilol (COREG) 12.5 MG tablet TAKE 1 TABLET BY MOUTH TWICE DAILY  60 tablet  10  . clopidogrel (PLAVIX) 75 MG tablet Take 1 tablet (75 mg total) by mouth daily.  30 tablet  9  . CRESTOR 40 MG tablet TAKE 1 TABLET BY MOUTH EVERY EVENING  30 tablet  5  . DULoxetine (CYMBALTA) 30 MG capsule Take 30 mg by mouth daily.        Marland Kitchen losartan (COZAAR) 50 MG tablet TAKE 1 TABLET BY MOUTH EVERY DAY  30 tablet  5  . nitroGLYCERIN (NITROSTAT) 0.4 MG SL tablet Place 0.4 mg under the tongue every 5 (five) minutes as needed. As directed as needed       . pantoprazole (PROTONIX) 40 MG tablet Take 1 tablet (40 mg total) by mouth daily.  30 tablet  11    No Known Allergies  Review of Systems negative except from HPI and PMH  Physical Exam BP 104/62  Pulse 62  Ht 5\' 4"  (1.626 m)  Wt 155 lb 12.8 oz (70.67 kg)  BMI 26.74 kg/m2 Well developed and well nourished in no acute distress HENT normal E  scleral and icterus clear Neck Supple JVP flat; carotids brisk and full Clear to ausculation Regular rate and rhythm, no murmurs gallops or rub Soft with active bowel sounds No clubbing cyanosis none Edema Alert and oriented, grossly normal motor and sensory function Skin Warm and Dry   Assessment and  Plan

## 2011-12-27 NOTE — Patient Instructions (Signed)

## 2011-12-27 NOTE — Assessment & Plan Note (Signed)
Stable on crrent meds

## 2011-12-27 NOTE — Assessment & Plan Note (Signed)
The patient's device was interrogated.  The information was reviewed. No changes were made in the programming.    

## 2012-01-22 ENCOUNTER — Other Ambulatory Visit: Payer: Self-pay | Admitting: Cardiovascular Disease

## 2012-01-23 ENCOUNTER — Other Ambulatory Visit: Payer: Self-pay | Admitting: *Deleted

## 2012-01-23 MED ORDER — LOSARTAN POTASSIUM 50 MG PO TABS: 50.0000 mg | ORAL_TABLET | Freq: Every day | ORAL | Status: DC

## 2012-03-01 ENCOUNTER — Other Ambulatory Visit: Payer: Self-pay | Admitting: Cardiovascular Disease

## 2012-03-29 ENCOUNTER — Encounter: Payer: Self-pay | Admitting: Internal Medicine

## 2012-03-29 ENCOUNTER — Ambulatory Visit (INDEPENDENT_AMBULATORY_CARE_PROVIDER_SITE_OTHER): Payer: Medicare Other | Admitting: *Deleted

## 2012-03-29 DIAGNOSIS — Z95810 Presence of automatic (implantable) cardiac defibrillator: Secondary | ICD-10-CM

## 2012-03-29 DIAGNOSIS — I2589 Other forms of chronic ischemic heart disease: Secondary | ICD-10-CM

## 2012-03-29 DIAGNOSIS — I5022 Chronic systolic (congestive) heart failure: Secondary | ICD-10-CM

## 2012-03-29 DIAGNOSIS — I255 Ischemic cardiomyopathy: Secondary | ICD-10-CM

## 2012-04-06 LAB — REMOTE ICD DEVICE
BRDY-0002RV: 40 {beats}/min
FVT: 0
HV IMPEDENCE: 73 Ohm
PACEART VT: 0
RV LEAD IMPEDENCE ICD: 646 Ohm
TZAT-0001SLOWVT: 1
TZAT-0002FASTVT: NEGATIVE
TZAT-0005SLOWVT: 88 pct
TZAT-0012FASTVT: 200 ms
TZAT-0018FASTVT: NEGATIVE
TZAT-0018SLOWVT: NEGATIVE
TZAT-0019FASTVT: 8 V
TZON-0005SLOWVT: 12
TZST-0001FASTVT: 3
TZST-0001FASTVT: 5
TZST-0001FASTVT: 6
TZST-0001SLOWVT: 2
TZST-0001SLOWVT: 5
TZST-0001SLOWVT: 6
TZST-0002FASTVT: NEGATIVE
TZST-0002FASTVT: NEGATIVE
TZST-0002FASTVT: NEGATIVE
TZST-0003SLOWVT: 35 J
TZST-0003SLOWVT: 35 J
VF: 0

## 2012-05-01 ENCOUNTER — Other Ambulatory Visit: Payer: Self-pay | Admitting: Cardiovascular Disease

## 2012-05-03 ENCOUNTER — Encounter: Payer: Self-pay | Admitting: *Deleted

## 2012-05-15 ENCOUNTER — Ambulatory Visit (INDEPENDENT_AMBULATORY_CARE_PROVIDER_SITE_OTHER): Payer: Medicare Other | Admitting: Cardiovascular Disease

## 2012-05-15 ENCOUNTER — Encounter: Payer: Self-pay | Admitting: Cardiovascular Disease

## 2012-05-15 VITALS — BP 108/65 | HR 69 | Ht 64.0 in | Wt 153.8 lb

## 2012-05-15 DIAGNOSIS — I255 Ischemic cardiomyopathy: Secondary | ICD-10-CM

## 2012-05-15 DIAGNOSIS — I2589 Other forms of chronic ischemic heart disease: Secondary | ICD-10-CM

## 2012-05-15 DIAGNOSIS — I2510 Atherosclerotic heart disease of native coronary artery without angina pectoris: Secondary | ICD-10-CM

## 2012-05-15 DIAGNOSIS — E785 Hyperlipidemia, unspecified: Secondary | ICD-10-CM

## 2012-05-15 NOTE — Patient Instructions (Addendum)
Your physician wants you to follow-up in: 6 MONTHS with Dr Excell Seltzer.  You will receive a reminder letter in the mail two months in advance. If you don't receive a letter, please call our office to schedule the follow-up appointment.  Your physician recommends that you return for a FASTING LIPID, LIVER and BMP in 6 MONTHS--nothing to eat or drink after midnight, lab opens at 8:30 (please have labs drawn one week prior to appointment)  Your physician recommends that you continue on your current medications as directed. Please refer to the Current Medication list given to you today.

## 2012-05-15 NOTE — Assessment & Plan Note (Signed)
The patient has no limitation. She has class I symptoms and is on appropriate medical program. She's had ICD placement because of severe LV dysfunction.

## 2012-05-15 NOTE — Assessment & Plan Note (Signed)
Lipids reviewed and there at goal with most recent values show an LDL 58. She will have followup labs in February 2014 an office followup thereafter.

## 2012-05-15 NOTE — Assessment & Plan Note (Signed)
The patient is stable without anginal symptoms. She's on appropriate medical program with aspirin for antiplatelet therapy, beta blocker, and an ARB. She has class I symptoms. I will see her back in 6 months.

## 2012-05-15 NOTE — Progress Notes (Signed)
   HPI:  56 year old woman presenting for followup evaluation. She has a severe ischemic cardiomyopathy after an anterior wall MI. When she presented with her myocardial infarction she was treated with primary PCI involving stenting of the LAD. She has undergone ICD implantation and has never had an ICD discharge. She presents today for followup evaluation. Lipids were last checked in February 2013 showed a cholesterol of 120, triglycerides 113, HDL 40, and LDL 58.  She's doing very well at this time. She's exercising nearly every day and has no symptoms with exertion. She tells me that she really feels better now than she did prior to her heart attack. She denies chest pain, dyspnea, edema, palpitations, lightheadedness, or syncope. She's been compliant with her medical program.  Outpatient Encounter Prescriptions as of 05/15/2012  Medication Sig Dispense Refill  . acetaminophen (TYLENOL) 325 MG tablet Take 650 mg by mouth every 6 (six) hours as needed.        Marland Kitchen aspirin EC 81 MG EC tablet Take 1 tablet (81 mg total) by mouth daily.  30 tablet  6  . carvedilol (COREG) 12.5 MG tablet TAKE 1 TABLET BY MOUTH TWICE DAILY  60 tablet  9  . clopidogrel (PLAVIX) 75 MG tablet TAKE 1 TABLET BY MOUTH DAILY WITH A MEAL  30 tablet  6  . CRESTOR 40 MG tablet TAKE 1 TABLET BY MOUTH EVERY EVENING  30 tablet  5  . DULoxetine (CYMBALTA) 30 MG capsule Take 30 mg by mouth daily.        Marland Kitchen losartan (COZAAR) 50 MG tablet Take 1 tablet (50 mg total) by mouth daily.  30 tablet  5  . nitroGLYCERIN (NITROSTAT) 0.4 MG SL tablet Place 0.4 mg under the tongue every 5 (five) minutes as needed. As directed as needed       . pantoprazole (PROTONIX) 40 MG tablet TAKE 1 TABLET BY MOUTH EVERY DAY  30 tablet  10    No Known Allergies  Past Medical History  Diagnosis Date  . Ischemic cardiomyopathy     post-MI LVEF less than 30%; acute AMI Rx w BMS>>LAD  . ICD (implantable cardiac defibrillator), single MDT     treated with BMS  to LAD  . Dyslipidemia   . GERD (gastroesophageal reflux disease)   . Depression   . Cervical disc disease     ROS: Negative except as per HPI  BP 108/65  Pulse 69  Ht 5\' 4"  (1.626 m)  Wt 153 lb 12.8 oz (69.763 kg)  BMI 26.40 kg/m2  PHYSICAL EXAM: Pt is alert and oriented, NAD HEENT: normal Neck: JVP - normal, carotids 2+= without bruits Lungs: CTA bilaterally CV: RRR without murmur or gallop Abd: soft, NT, Positive BS, no hepatomegaly Ext: no C/C/E, distal pulses intact and equal Skin: warm/dry no rash  EKG:  Normal sinus rhythm 70 beats per minute, age-indeterminate septal infarct.  ASSESSMENT AND PLAN:

## 2012-07-09 ENCOUNTER — Encounter: Payer: Self-pay | Admitting: Internal Medicine

## 2012-07-09 ENCOUNTER — Ambulatory Visit (INDEPENDENT_AMBULATORY_CARE_PROVIDER_SITE_OTHER): Payer: Medicare Other | Admitting: *Deleted

## 2012-07-09 DIAGNOSIS — Z95810 Presence of automatic (implantable) cardiac defibrillator: Secondary | ICD-10-CM

## 2012-07-09 DIAGNOSIS — I255 Ischemic cardiomyopathy: Secondary | ICD-10-CM

## 2012-07-09 DIAGNOSIS — I5022 Chronic systolic (congestive) heart failure: Secondary | ICD-10-CM

## 2012-07-09 DIAGNOSIS — I2589 Other forms of chronic ischemic heart disease: Secondary | ICD-10-CM

## 2012-07-13 LAB — REMOTE ICD DEVICE
PACEART VT: 0
TOT-0002: 0
TOT-0006: 20110110000000
TZAT-0001SLOWVT: 1
TZAT-0012FASTVT: 200 ms
TZAT-0013SLOWVT: 3
TZAT-0018SLOWVT: NEGATIVE
TZAT-0019FASTVT: 8 V
TZAT-0020FASTVT: 1.5 ms
TZAT-0020SLOWVT: 1.5 ms
TZON-0003VSLOWVT: 450 ms
TZON-0004SLOWVT: 40
TZON-0005SLOWVT: 12
TZST-0001FASTVT: 2
TZST-0001FASTVT: 4
TZST-0001FASTVT: 5
TZST-0001FASTVT: 6
TZST-0001SLOWVT: 2
TZST-0001SLOWVT: 4
TZST-0001SLOWVT: 6
TZST-0002FASTVT: NEGATIVE
TZST-0003SLOWVT: 35 J
TZST-0003SLOWVT: 35 J
VENTRICULAR PACING ICD: 0 pct

## 2012-07-28 ENCOUNTER — Other Ambulatory Visit: Payer: Self-pay | Admitting: Cardiovascular Disease

## 2012-08-14 ENCOUNTER — Encounter: Payer: Self-pay | Admitting: *Deleted

## 2012-09-17 ENCOUNTER — Encounter: Payer: Self-pay | Admitting: Internal Medicine

## 2012-09-24 ENCOUNTER — Encounter: Payer: Self-pay | Admitting: Internal Medicine

## 2012-09-24 ENCOUNTER — Other Ambulatory Visit: Payer: Self-pay | Admitting: Cardiovascular Disease

## 2012-09-24 ENCOUNTER — Ambulatory Visit (INDEPENDENT_AMBULATORY_CARE_PROVIDER_SITE_OTHER): Payer: Medicare Other | Admitting: *Deleted

## 2012-09-24 DIAGNOSIS — I255 Ischemic cardiomyopathy: Secondary | ICD-10-CM

## 2012-09-24 DIAGNOSIS — I5022 Chronic systolic (congestive) heart failure: Secondary | ICD-10-CM

## 2012-09-24 DIAGNOSIS — I2589 Other forms of chronic ischemic heart disease: Secondary | ICD-10-CM

## 2012-09-24 LAB — ICD DEVICE OBSERVATION
BATTERY VOLTAGE: 3.0673 V
BRDY-0002RV: 40 {beats}/min
PACEART VT: 0
TOT-0006: 20110110000000
TZAT-0005SLOWVT: 88 pct
TZAT-0011SLOWVT: 10 ms
TZAT-0012FASTVT: 200 ms
TZAT-0019FASTVT: 8 V
TZAT-0020FASTVT: 1.5 ms
TZON-0004VSLOWVT: 20
TZST-0001FASTVT: 5
TZST-0001FASTVT: 6
TZST-0001SLOWVT: 3
TZST-0001SLOWVT: 6
TZST-0002FASTVT: NEGATIVE
TZST-0002FASTVT: NEGATIVE
TZST-0003SLOWVT: 20 J
TZST-0003SLOWVT: 35 J
TZST-0003SLOWVT: 35 J
VENTRICULAR PACING ICD: 0 pct

## 2012-09-24 NOTE — Progress Notes (Signed)
ICD check with ICM 

## 2012-09-27 ENCOUNTER — Telehealth: Payer: Self-pay | Admitting: *Deleted

## 2012-09-27 MED ORDER — CARVEDILOL 12.5 MG PO TABS: 25.0000 mg | ORAL_TABLET | Freq: Two times a day (BID) | ORAL | Status: DC

## 2012-09-27 NOTE — Telephone Encounter (Signed)
Per SK change to Coreg 25mg  bid. Spoke w/pt and pt aware of this change. RX sent in to [facility] in [place] with new directions. Pt scheduled for 11-13-12 @ 1030 with SK.

## 2012-10-11 ENCOUNTER — Other Ambulatory Visit: Payer: Self-pay | Admitting: Cardiovascular Disease

## 2012-10-11 DIAGNOSIS — R609 Edema, unspecified: Secondary | ICD-10-CM

## 2012-10-11 DIAGNOSIS — I429 Cardiomyopathy, unspecified: Secondary | ICD-10-CM

## 2012-10-11 MED ORDER — FUROSEMIDE 20 MG PO TABS: ORAL_TABLET | ORAL | Status: DC

## 2012-10-11 NOTE — Telephone Encounter (Signed)
Spoke with pt, hands and feet are very swollen today. Tuesday she weighed 149 and today she is 154. She denies SOB or other symptoms. Medicine was confirmed. Will discuss with dr Patty Sermons DOD.

## 2012-10-11 NOTE — Telephone Encounter (Signed)
Discussed with dr Patty Sermons, the pt will be scheduled for a 2d-echo. We will give her furosemide 20 mg once every other day as needed for edema. She will keep her follow up appt with dr cooper in feb. Left message for pt to call

## 2012-10-11 NOTE — Telephone Encounter (Signed)
Spoke with pt, aware of dr brackbill's recommendations. Echo scheduled.

## 2012-10-11 NOTE — Telephone Encounter (Signed)
Pt calling re weight gain of 5 lbs since yesterday, hands really swollen, feet somewhat, pls advise

## 2012-10-15 ENCOUNTER — Encounter: Payer: Medicare Other | Admitting: *Deleted

## 2012-10-18 ENCOUNTER — Encounter: Payer: Self-pay | Admitting: *Deleted

## 2012-10-24 ENCOUNTER — Other Ambulatory Visit: Payer: Self-pay | Admitting: Cardiovascular Disease

## 2012-10-29 ENCOUNTER — Ambulatory Visit (HOSPITAL_COMMUNITY): Payer: Medicare Other | Attending: Cardiology | Admitting: Radiology

## 2012-10-29 ENCOUNTER — Other Ambulatory Visit: Payer: Self-pay

## 2012-10-29 ENCOUNTER — Other Ambulatory Visit (INDEPENDENT_AMBULATORY_CARE_PROVIDER_SITE_OTHER): Payer: Medicare Other

## 2012-10-29 DIAGNOSIS — I2510 Atherosclerotic heart disease of native coronary artery without angina pectoris: Secondary | ICD-10-CM

## 2012-10-29 DIAGNOSIS — E785 Hyperlipidemia, unspecified: Secondary | ICD-10-CM

## 2012-10-29 DIAGNOSIS — I369 Nonrheumatic tricuspid valve disorder, unspecified: Secondary | ICD-10-CM | POA: Insufficient documentation

## 2012-10-29 DIAGNOSIS — I255 Ischemic cardiomyopathy: Secondary | ICD-10-CM

## 2012-10-29 DIAGNOSIS — I429 Cardiomyopathy, unspecified: Secondary | ICD-10-CM

## 2012-10-29 DIAGNOSIS — R609 Edema, unspecified: Secondary | ICD-10-CM

## 2012-10-29 DIAGNOSIS — I2589 Other forms of chronic ischemic heart disease: Secondary | ICD-10-CM | POA: Insufficient documentation

## 2012-10-29 DIAGNOSIS — R0989 Other specified symptoms and signs involving the circulatory and respiratory systems: Secondary | ICD-10-CM

## 2012-10-29 NOTE — Progress Notes (Signed)
Echocardiogram performed.  

## 2012-10-30 ENCOUNTER — Other Ambulatory Visit: Payer: Self-pay | Admitting: Cardiovascular Disease

## 2012-11-07 ENCOUNTER — Ambulatory Visit (INDEPENDENT_AMBULATORY_CARE_PROVIDER_SITE_OTHER): Payer: Medicare Other | Admitting: Cardiovascular Disease

## 2012-11-07 ENCOUNTER — Encounter: Payer: Self-pay | Admitting: Cardiovascular Disease

## 2012-11-07 VITALS — BP 92/58 | HR 60 | Ht 64.0 in | Wt 154.0 lb

## 2012-11-07 DIAGNOSIS — I2589 Other forms of chronic ischemic heart disease: Secondary | ICD-10-CM

## 2012-11-07 DIAGNOSIS — E785 Hyperlipidemia, unspecified: Secondary | ICD-10-CM

## 2012-11-07 DIAGNOSIS — I255 Ischemic cardiomyopathy: Secondary | ICD-10-CM

## 2012-11-07 DIAGNOSIS — I2510 Atherosclerotic heart disease of native coronary artery without angina pectoris: Secondary | ICD-10-CM

## 2012-11-07 NOTE — Progress Notes (Signed)
HPI:  57 year old woman presenting for followup evaluation. The patient has a severe ischemic cardiomyopathy following an anterior wall myocardial infarction. She was treated with stenting of the LAD at the time of her MI. She underwent ICD implantation. Her recent echocardiogram showed her ejection fraction has improved to the range of 35-40%.  On Christmas Eve she had palpitations. She did not have associated chest pain or lightheadedness. Her ICD discharged. Interrogation showed SVT. Her carvedilol was increased. She's had no further problems.  From a symptomatic perspective she is now doing well. She denies chest pain, chest pressure, dyspnea, edema, or palpitations. She's had no further ICD shocks.  Outpatient Encounter Prescriptions as of 11/07/2012  Medication Sig Dispense Refill  . acetaminophen (TYLENOL) 325 MG tablet Take 650 mg by mouth every 6 (six) hours as needed.        Marland Kitchen aspirin EC 81 MG EC tablet Take 1 tablet (81 mg total) by mouth daily.  30 tablet  6  . carvedilol (COREG) 12.5 MG tablet Take 2 tablets (25 mg total) by mouth 2 (two) times daily with a meal.  60 tablet  9  . clopidogrel (PLAVIX) 75 MG tablet TAKE 1 TABLET BY MOUTH DAILY WITH A MEAL  30 tablet  3  . CRESTOR 40 MG tablet TAKE 1 TABLET BY MOUTH EVERY EVENING  30 tablet  3  . DULoxetine (CYMBALTA) 30 MG capsule Take 30 mg by mouth daily.        . furosemide (LASIX) 20 MG tablet One tablet every other day as needed for swelling  30 tablet  6  . losartan (COZAAR) 50 MG tablet Take 1 tablet (50 mg total) by mouth daily.  90 tablet  3  . nitroGLYCERIN (NITROSTAT) 0.4 MG SL tablet Place 0.4 mg under the tongue every 5 (five) minutes as needed. As directed as needed       . pantoprazole (PROTONIX) 40 MG tablet TAKE 1 TABLET BY MOUTH EVERY DAY  30 tablet  10   No facility-administered encounter medications on file as of 11/07/2012.    No Known Allergies  Past Medical History  Diagnosis Date  . Ischemic  cardiomyopathy     post-MI LVEF less than 30%; acute AMI Rx w BMS>>LAD  . ICD (implantable cardiac defibrillator), single MDT     treated with BMS to LAD  . Dyslipidemia   . GERD (gastroesophageal reflux disease)   . Depression   . Cervical disc disease     ROS: Negative except as per HPI  Ht 5\' 4"  (1.626 m)  Wt 69.854 kg (154 lb)  BMI 26.42 kg/m2  PHYSICAL EXAM: Pt is alert and oriented, NAD HEENT: normal Neck: JVP - normal, carotids 2+= without bruits Lungs: CTA bilaterally CV: RRR without murmur or gallop Abd: soft, NT, Positive BS, no hepatomegaly Ext: no C/C/E, distal pulses intact and equal Skin: warm/dry no rash  2D Echo: Left ventricle: The cavity size was mildly dilated. Wall thickness was normal. Systolic function was moderately reduced. The estimated ejection fraction was in the range of 35% to 40%. Regional wall motion abnormalities: There is akinesis of the mid-distalanteroseptal myocardium. There is akinesis and scarring of the entireinferior and inferoseptal myocardium. Features are consistent with a pseudonormal left ventricular filling pattern, with concomitant abnormal relaxation and increased filling pressure (grade 2 diastolic dysfunction).  ------------------------------------------------------------ Aortic valve: Poorly visualized. Trileaflet; mildly calcified leaflets. Doppler: There was no stenosis. No regurgitation.  ------------------------------------------------------------ Aorta: Aortic root: The aortic root was normal  in size.  ------------------------------------------------------------ Mitral valve: Structurally normal valve. Leaflet separation was normal. Doppler: Transvalvular velocity was within the normal range. There was no evidence for stenosis. No regurgitation. Peak gradient: 4mm Hg (D).  ------------------------------------------------------------ Left atrium: The atrium was at the upper limits of normal in  size.  ------------------------------------------------------------ Right ventricle: The cavity size was normal. Pacer wire or catheter noted in right ventricle. Systolic function was normal.  ------------------------------------------------------------ Pulmonic valve: Structurally normal valve. Cusp separation was normal. Doppler: Transvalvular velocity was within the normal range. No regurgitation.  ------------------------------------------------------------ Tricuspid valve: Structurally normal valve. Leaflet separation was normal. Doppler: Transvalvular velocity was within the normal range. Mild regurgitation.  ------------------------------------------------------------ Pulmonary artery: Systolic pressure was within the normal range.  ------------------------------------------------------------ Right atrium: The atrium was normal in size.  ------------------------------------------------------------ Pericardium: There was no pericardial effusion.  ------------------------------------------------------------ Systemic veins: Inferior vena cava: The vessel was normal in size; the respirophasic diameter changes were in the normal range (= 50%); findings are consistent with normal central venous pressure.  EKG: Normal sinus rhythm 60 beats per minute, septal infarct age undetermined, nonspecific ST/T wave abnormality.   ASSESSMENT AND PLAN: 1. Ischemic cardiomyopathy. The patient's LVEF is modestly improved. She has no active symptoms of heart failure. She will continue on a combination of carvedilol and losartan. I would like to see her back in 6 months.  2. Coronary artery disease. No anginal symptoms at present. Continue aspirin for antiplatelet therapy and Crestor for lipid-lowering.  3. SVT. She received an ICD shock on Christmas Eve.  Carvedilol was increased. No further problems. Continue to followup with Dr. Graciela Husbands.  4. Hyperlipidemia. She is due for followup lab  work. We will reschedule this.  Tonny Bollman 11/07/2012 10:22 AM

## 2012-11-07 NOTE — Patient Instructions (Addendum)
Your physician wants you to follow-up in: 6 MONTHS with Dr Excell Seltzer.  You will receive a reminder letter in the mail two months in advance. If you don't receive a letter, please call our office to schedule the follow-up appointment.  Your physician recommends that you return for a FASTING LIPID, LIVER and BMP--nothing to eat or drink after midnight (11/13/12)  Your physician recommends that you continue on your current medications as directed. Please refer to the Current Medication list given to you today.

## 2012-11-08 ENCOUNTER — Encounter: Payer: Self-pay | Admitting: Cardiovascular Disease

## 2012-11-13 ENCOUNTER — Other Ambulatory Visit (INDEPENDENT_AMBULATORY_CARE_PROVIDER_SITE_OTHER): Payer: Medicare Other

## 2012-11-13 ENCOUNTER — Encounter: Payer: Self-pay | Admitting: Internal Medicine

## 2012-11-13 ENCOUNTER — Ambulatory Visit: Payer: Medicare Other | Admitting: Cardiovascular Disease

## 2012-11-13 ENCOUNTER — Telehealth: Payer: Self-pay

## 2012-11-13 ENCOUNTER — Ambulatory Visit (INDEPENDENT_AMBULATORY_CARE_PROVIDER_SITE_OTHER): Payer: Medicare Other | Admitting: Internal Medicine

## 2012-11-13 VITALS — BP 107/58 | HR 62 | Ht 66.0 in | Wt 155.8 lb

## 2012-11-13 DIAGNOSIS — I471 Supraventricular tachycardia, unspecified: Secondary | ICD-10-CM | POA: Insufficient documentation

## 2012-11-13 DIAGNOSIS — I498 Other specified cardiac arrhythmias: Secondary | ICD-10-CM

## 2012-11-13 DIAGNOSIS — Z95810 Presence of automatic (implantable) cardiac defibrillator: Secondary | ICD-10-CM

## 2012-11-13 DIAGNOSIS — I2589 Other forms of chronic ischemic heart disease: Secondary | ICD-10-CM

## 2012-11-13 DIAGNOSIS — I255 Ischemic cardiomyopathy: Secondary | ICD-10-CM

## 2012-11-13 DIAGNOSIS — I5022 Chronic systolic (congestive) heart failure: Secondary | ICD-10-CM

## 2012-11-13 DIAGNOSIS — E785 Hyperlipidemia, unspecified: Secondary | ICD-10-CM

## 2012-11-13 DIAGNOSIS — E876 Hypokalemia: Secondary | ICD-10-CM

## 2012-11-13 DIAGNOSIS — I2510 Atherosclerotic heart disease of native coronary artery without angina pectoris: Secondary | ICD-10-CM

## 2012-11-13 LAB — BASIC METABOLIC PANEL
CO2: 29 mEq/L (ref 19–32)
Chloride: 101 mEq/L (ref 96–112)
Creatinine, Ser: 1 mg/dL (ref 0.4–1.2)
Potassium: 2.7 mEq/L — CL (ref 3.5–5.1)

## 2012-11-13 LAB — ICD DEVICE OBSERVATION
DEV-0020ICD: NEGATIVE
FVT: 0
PACEART VT: 0
TOT-0001: 2
TOT-0002: 0
TZAT-0001FASTVT: 1
TZAT-0002FASTVT: NEGATIVE
TZAT-0011SLOWVT: 10 ms
TZAT-0012FASTVT: 200 ms
TZAT-0012SLOWVT: 200 ms
TZAT-0013SLOWVT: 3
TZAT-0018FASTVT: NEGATIVE
TZAT-0018SLOWVT: NEGATIVE
TZAT-0019FASTVT: 8 V
TZAT-0019SLOWVT: 8 V
TZON-0003SLOWVT: 340 ms
TZON-0003VSLOWVT: 450 ms
TZON-0004VSLOWVT: 20
TZON-0005SLOWVT: 12
TZST-0001FASTVT: 2
TZST-0001FASTVT: 4
TZST-0001SLOWVT: 3
TZST-0001SLOWVT: 6
TZST-0002FASTVT: NEGATIVE
TZST-0002FASTVT: NEGATIVE
TZST-0003SLOWVT: 35 J
VF: 0

## 2012-11-13 LAB — HEPATIC FUNCTION PANEL
ALT: 16 U/L (ref 0–35)
AST: 18 U/L (ref 0–37)
Alkaline Phosphatase: 99 U/L (ref 39–117)
Bilirubin, Direct: 0 mg/dL (ref 0.0–0.3)
Total Bilirubin: 0.4 mg/dL (ref 0.3–1.2)
Total Protein: 7.2 g/dL (ref 6.0–8.3)

## 2012-11-13 LAB — LIPID PANEL
LDL Cholesterol: 56 mg/dL (ref 0–99)
Total CHOL/HDL Ratio: 4

## 2012-11-13 MED ORDER — POTASSIUM CHLORIDE CRYS ER 20 MEQ PO TBCR: 40.0000 meq | EXTENDED_RELEASE_TABLET | Freq: Every day | ORAL | Status: DC

## 2012-11-13 MED ORDER — CARVEDILOL 25 MG PO TABS: 25.0000 mg | ORAL_TABLET | Freq: Two times a day (BID) | ORAL | Status: DC

## 2012-11-13 NOTE — Assessment & Plan Note (Signed)
A recent episode of swelling correlated with the elevation in her optivol. This responded to increasing her diuretics. In the event that she has complaints, transmission of optivol information might be helpful.

## 2012-11-13 NOTE — Progress Notes (Signed)
Patient Care Team: Elfredia Nevins, MD as PCP - General (Internal Medicine)   HPI  [name] [name] is a 57 y.o. female seen in followup for an ICD implanted for primary prevention OCT 2011 . She is a 57 year-old woman s/p anterior MI 2010, who underwent primary PCI at that time. She had persisent severe LV dysfunction   Most recent echo  2/14 demonstrated EF 35-40%   Inappropriate shocks for SVT occurred on Christmas Eve. Carvedilol dose was increased and she is tolerating the higher dose. She had a episode in late January where she had swelling of her hands and her feet ; she was given higher doses of diuretics and this resolved. This correlated with a elevation of Optivol  The patient denies chest pain, shortness of breath, nocturnal dyspnea, orthopnea or peripheral edema.  There have been no palpitations, lightheadedness or syncope.     Past Medical History  Diagnosis Date  . Ischemic cardiomyopathy     post-MI LVEF less than 30%; acute AMI Rx w BMS>>LAD  . ICD (implantable cardiac defibrillator), single MDT     treated with BMS to LAD  . Dyslipidemia   . GERD (gastroesophageal reflux disease)   . Depression   . Cervical disc disease     Past Surgical History  Procedure Laterality Date  . Cervical disc surgery    . Partial right masectomy    . Coronary angioplasty with stent placement    . Icd implanted      Current Outpatient Prescriptions  Medication Sig Dispense Refill  . acetaminophen (TYLENOL) 325 MG tablet Take 650 mg by mouth every 6 (six) hours as needed.        Marland Kitchen aspirin EC 81 MG EC tablet Take 1 tablet (81 mg total) by mouth daily.  30 tablet  6  . carvedilol (COREG) 12.5 MG tablet Take 2 tablets (25 mg total) by mouth 2 (two) times daily with a meal.  60 tablet  9  . clopidogrel (PLAVIX) 75 MG tablet TAKE 1 TABLET BY MOUTH DAILY WITH A MEAL  30 tablet  3  . CRESTOR 40 MG tablet TAKE 1 TABLET BY MOUTH EVERY EVENING  30 tablet  3  . DULoxetine (CYMBALTA) 30 MG  capsule Take 30 mg by mouth daily.        . furosemide (LASIX) 20 MG tablet One tablet every other day as needed for swelling  30 tablet  6  . losartan (COZAAR) 50 MG tablet Take 1 tablet (50 mg total) by mouth daily.  90 tablet  3  . nitroGLYCERIN (NITROSTAT) 0.4 MG SL tablet Place 0.4 mg under the tongue every 5 (five) minutes as needed. As directed as needed       . pantoprazole (PROTONIX) 40 MG tablet TAKE 1 TABLET BY MOUTH EVERY DAY  30 tablet  10   No current facility-administered medications for this visit.    No Known Allergies  Review of Systems negative except from HPI and PMH  Physical Exam BP 107/58  Pulse 62  Ht 5\' 6"  (1.676 m)  Wt 155 lb 12.8 oz (70.67 kg)  BMI 25.16 kg/m2 Well developed and well nourished in no acute distress HENT normal E scleral and icterus clear Neck Supple JVP flat; carotids brisk and full Clear to ausculation  Regular rate and rhythm, no murmurs gallops or rub Soft with active bowel sounds No clubbing cyanosis nonethis Edema Alert and oriented, grossly normal motor and sensory function Skin Warm and Dry  Assessment and  Plan

## 2012-11-13 NOTE — Telephone Encounter (Signed)
I spoke with the pt and made her aware that Dr Excell Seltzer would like to start Potassium Chloride take two tablets by mouth once a day.  The pt will take her first dose today when she picks up the medication.  I have instructed the pt to have a BMP rechecked on 11/19/12.

## 2012-11-13 NOTE — Assessment & Plan Note (Signed)
The patient's device was interrogated.  The information was reviewed. No changes were made in the programming.    

## 2012-11-13 NOTE — Patient Instructions (Signed)
Your physician wants you to follow-up in: 9 months with Dr. Graciela Husbands. You will receive a reminder letter in the mail two months in advance. If you don't receive a letter, please call our office to schedule the follow-up appointment.  A prescription for Coreg (carvedilol) 25 mg tablets to take one tablet twice daily has been sent to your pharmacy.

## 2012-11-13 NOTE — Assessment & Plan Note (Signed)
Device interrogation was most consistent with SVT. There is no interruption of a intervals with ventricular pacing suggesting the possibility of an atrial tachycardia. Her beta blockers were increased and she has not had recurrent detections of events. We will follow this.

## 2012-11-13 NOTE — Assessment & Plan Note (Signed)
Stable on current medications 

## 2012-11-19 ENCOUNTER — Other Ambulatory Visit: Payer: Medicare Other

## 2012-11-21 ENCOUNTER — Other Ambulatory Visit (INDEPENDENT_AMBULATORY_CARE_PROVIDER_SITE_OTHER): Payer: Medicare Other

## 2012-11-21 DIAGNOSIS — E876 Hypokalemia: Secondary | ICD-10-CM

## 2012-11-21 LAB — BASIC METABOLIC PANEL
Calcium: 9.4 mg/dL (ref 8.4–10.5)
GFR: 61.2 mL/min (ref >60.00)
Sodium: 136 mEq/L (ref 135–145)

## 2013-02-12 ENCOUNTER — Ambulatory Visit (INDEPENDENT_AMBULATORY_CARE_PROVIDER_SITE_OTHER): Payer: Medicare Other | Admitting: *Deleted

## 2013-02-12 DIAGNOSIS — Z95810 Presence of automatic (implantable) cardiac defibrillator: Secondary | ICD-10-CM

## 2013-02-12 DIAGNOSIS — I2589 Other forms of chronic ischemic heart disease: Secondary | ICD-10-CM

## 2013-02-12 DIAGNOSIS — I255 Ischemic cardiomyopathy: Secondary | ICD-10-CM

## 2013-02-12 DIAGNOSIS — I5022 Chronic systolic (congestive) heart failure: Secondary | ICD-10-CM

## 2013-02-13 ENCOUNTER — Encounter: Payer: Self-pay | Admitting: Internal Medicine

## 2013-02-18 LAB — REMOTE ICD DEVICE
BATTERY VOLTAGE: 3.0605 V
BRDY-0002RV: 40 {beats}/min
PACEART VT: 0
RV LEAD AMPLITUDE: 15 mv
TZAT-0001FASTVT: 1
TZAT-0001SLOWVT: 1
TZAT-0004SLOWVT: 8
TZAT-0012FASTVT: 200 ms
TZAT-0012SLOWVT: 200 ms
TZAT-0018FASTVT: NEGATIVE
TZAT-0019SLOWVT: 8 V
TZAT-0020SLOWVT: 1.5 ms
TZON-0003SLOWVT: 340 ms
TZST-0001FASTVT: 2
TZST-0001FASTVT: 4
TZST-0001FASTVT: 5
TZST-0001FASTVT: 6
TZST-0001SLOWVT: 2
TZST-0001SLOWVT: 4
TZST-0001SLOWVT: 6
TZST-0002FASTVT: NEGATIVE
TZST-0003SLOWVT: 20 J
TZST-0003SLOWVT: 35 J
VENTRICULAR PACING ICD: 0 pct
VF: 0

## 2013-02-26 ENCOUNTER — Other Ambulatory Visit: Payer: Self-pay | Admitting: Cardiovascular Disease

## 2013-03-11 ENCOUNTER — Encounter: Payer: Self-pay | Admitting: *Deleted

## 2013-03-31 ENCOUNTER — Other Ambulatory Visit: Payer: Self-pay | Admitting: Cardiovascular Disease

## 2013-05-14 ENCOUNTER — Encounter: Payer: Self-pay | Admitting: Cardiovascular Disease

## 2013-05-14 ENCOUNTER — Ambulatory Visit (INDEPENDENT_AMBULATORY_CARE_PROVIDER_SITE_OTHER): Payer: Medicare Other | Admitting: Cardiovascular Disease

## 2013-05-14 VITALS — BP 116/66 | HR 60 | Ht 64.0 in | Wt 152.0 lb

## 2013-05-14 DIAGNOSIS — E785 Hyperlipidemia, unspecified: Secondary | ICD-10-CM

## 2013-05-14 DIAGNOSIS — I2510 Atherosclerotic heart disease of native coronary artery without angina pectoris: Secondary | ICD-10-CM

## 2013-05-14 NOTE — Patient Instructions (Addendum)
Your physician wants you to follow-up in: 6 MONTHS with Dr Excell Seltzer.  You will receive a reminder letter in the mail two months in advance. If you don't receive a letter, please call our office to schedule the follow-up appointment.  Your physician recommends that you return for a FASTING LIPID, LIVER and BMP in 6 MONTHS--nothing to eat or drink after midnight, lab opens at 7:30.   .Your physician has recommended you make the following change in your medication: STOP Plavix

## 2013-05-14 NOTE — Progress Notes (Signed)
HPI:  57 year old woman presenting for followup evaluation. The patient has a severe ischemic cardiomyopathy following an anterior wall myocardial infarction. She was treated with stenting of the LAD at the time of her MI. She underwent ICD implantation. Her most recent echocardiogram showed her ejection fraction has improved to the range of 35-40%.  The patient is doing well. She denies chest pain or pressure. No dyspnea, edema, or palpitations. She's compliant with medications. She exercises sporadically. No ICD shocks since last evaluation.  Outpatient Encounter Prescriptions as of 05/14/2013  Medication Sig Dispense Refill  . acetaminophen (TYLENOL) 325 MG tablet Take 650 mg by mouth every 6 (six) hours as needed.        Marland Kitchen aspirin EC 81 MG EC tablet Take 1 tablet (81 mg total) by mouth daily.  30 tablet  6  . carvedilol (COREG) 25 MG tablet Take 1 tablet (25 mg total) by mouth 2 (two) times daily.  180 tablet  3  . clopidogrel (PLAVIX) 75 MG tablet TAKE 1 TABLET BY MOUTH EVERY DAY WITH A MEAL  30 tablet  5  . CRESTOR 40 MG tablet TAKE 1 TABLET BY MOUTH EVERY EVENING  30 tablet  5  . DULoxetine (CYMBALTA) 30 MG capsule Take 30 mg by mouth daily.        . furosemide (LASIX) 20 MG tablet One tablet every other day as needed for swelling  30 tablet  6  . losartan (COZAAR) 50 MG tablet Take 1 tablet (50 mg total) by mouth daily.  90 tablet  3  . nitroGLYCERIN (NITROSTAT) 0.4 MG SL tablet Place 0.4 mg under the tongue every 5 (five) minutes as needed. As directed as needed       . pantoprazole (PROTONIX) 40 MG tablet TAKE 1 TABLET BY MOUTH EVERY DAY  30 tablet  6  . potassium chloride SA (K-DUR,KLOR-CON) 20 MEQ tablet Take 2 tablets (40 mEq total) by mouth daily.  60 tablet  6   No facility-administered encounter medications on file as of 05/14/2013.    No Known Allergies  Past Medical History  Diagnosis Date  . Ischemic cardiomyopathy     post-MI LVEF less than 30%; acute AMI Rx w BMS>>LAD   . ICD (implantable cardiac defibrillator), single MDT     treated with BMS to LAD  . Dyslipidemia   . GERD (gastroesophageal reflux disease)   . Depression   . Cervical disc disease    BP 116/66  Pulse 60  Ht 5\' 4"  (1.626 m)  Wt 152 lb (68.947 kg)  BMI 26.08 kg/m2  SpO2 96%  PHYSICAL EXAM: Pt is alert and oriented, NAD HEENT: normal Neck: JVP - normal, carotids 2+= without bruits Lungs: CTA bilaterally CV: RRR without murmur or gallop Abd: soft, NT, Positive BS, no hepatomegaly Ext: no C/C/E, distal pulses intact and equal Skin: warm/dry no rash  EKG:  Sinus rhythm 60 beats per minute, age-indeterminate anterior infarct, otherwise within normal limits. No significant change from previous tracing.  ASSESSMENT AND PLAN: 1. Coronary artery disease, native vessel. The patient remained stable without anginal symptoms. She has several years out from her myocardial infarction. I advised that she can stop Plavix. She should continue on lifelong aspirin 81 mg. Her other medications were reviewed and are appropriate.  2. Ischemic cardiomyopathy, severe. The patient is status post ICD implantation. She's on a good medical regimen with carvedilol, losartan, and furosemide. No signs of congestive heart failure. She did have an episode of leg  swelling several months ago and started taking Lasix.  3. Hyperlipidemia. She is on high-dose Crestor. Repeat lipids and LFTs in February when she is at one year from her last labs.  Tonny Bollman 05/14/2013 4:32 PM

## 2013-05-21 ENCOUNTER — Encounter: Payer: Self-pay | Admitting: Internal Medicine

## 2013-05-21 ENCOUNTER — Ambulatory Visit (INDEPENDENT_AMBULATORY_CARE_PROVIDER_SITE_OTHER): Payer: Medicare Other | Admitting: *Deleted

## 2013-05-21 DIAGNOSIS — I2589 Other forms of chronic ischemic heart disease: Secondary | ICD-10-CM

## 2013-05-21 DIAGNOSIS — I255 Ischemic cardiomyopathy: Secondary | ICD-10-CM

## 2013-05-21 DIAGNOSIS — I5022 Chronic systolic (congestive) heart failure: Secondary | ICD-10-CM

## 2013-05-21 DIAGNOSIS — Z95810 Presence of automatic (implantable) cardiac defibrillator: Secondary | ICD-10-CM

## 2013-05-31 LAB — REMOTE ICD DEVICE
BATTERY VOLTAGE: 3.0605 V
CHARGE TIME: 9.709 s
DEV-0020ICD: NEGATIVE
FVT: 0
RV LEAD AMPLITUDE: 12.6 mv
RV LEAD IMPEDENCE ICD: 684 Ohm
TOT-0001: 2
TZAT-0001FASTVT: 1
TZAT-0001SLOWVT: 1
TZAT-0002FASTVT: NEGATIVE
TZAT-0004SLOWVT: 8
TZAT-0005SLOWVT: 88 pct
TZAT-0012FASTVT: 200 ms
TZAT-0013SLOWVT: 3
TZAT-0018FASTVT: NEGATIVE
TZAT-0019FASTVT: 8 V
TZAT-0020SLOWVT: 1.5 ms
TZON-0004SLOWVT: 40
TZST-0001FASTVT: 4
TZST-0001FASTVT: 5
TZST-0001FASTVT: 6
TZST-0001SLOWVT: 2
TZST-0001SLOWVT: 4
TZST-0002FASTVT: NEGATIVE
TZST-0002FASTVT: NEGATIVE
TZST-0002FASTVT: NEGATIVE
TZST-0003SLOWVT: 35 J
TZST-0003SLOWVT: 35 J
TZST-0003SLOWVT: 35 J
VF: 0

## 2013-06-14 ENCOUNTER — Other Ambulatory Visit: Payer: Self-pay | Admitting: Cardiovascular Disease

## 2013-06-24 ENCOUNTER — Encounter: Payer: Self-pay | Admitting: *Deleted

## 2013-06-24 ENCOUNTER — Other Ambulatory Visit: Payer: Self-pay | Admitting: Cardiovascular Disease

## 2013-07-01 ENCOUNTER — Other Ambulatory Visit: Payer: Self-pay

## 2013-07-01 MED ORDER — ROSUVASTATIN CALCIUM 40 MG PO TABS: ORAL_TABLET | ORAL | Status: DC

## 2013-07-13 ENCOUNTER — Other Ambulatory Visit: Payer: Self-pay | Admitting: Cardiovascular Disease

## 2013-08-26 ENCOUNTER — Encounter (INDEPENDENT_AMBULATORY_CARE_PROVIDER_SITE_OTHER): Payer: Medicare Other

## 2013-08-26 DIAGNOSIS — I5022 Chronic systolic (congestive) heart failure: Secondary | ICD-10-CM

## 2013-08-26 DIAGNOSIS — I255 Ischemic cardiomyopathy: Secondary | ICD-10-CM

## 2013-08-26 DIAGNOSIS — Z95810 Presence of automatic (implantable) cardiac defibrillator: Secondary | ICD-10-CM

## 2013-08-26 DIAGNOSIS — I428 Other cardiomyopathies: Secondary | ICD-10-CM

## 2013-10-02 ENCOUNTER — Encounter: Payer: Self-pay | Admitting: *Deleted

## 2013-10-26 ENCOUNTER — Other Ambulatory Visit: Payer: Self-pay | Admitting: Cardiovascular Disease

## 2013-10-28 ENCOUNTER — Other Ambulatory Visit: Payer: Self-pay | Admitting: Cardiovascular Disease

## 2013-10-29 ENCOUNTER — Other Ambulatory Visit: Payer: Self-pay | Admitting: *Deleted

## 2013-10-29 MED ORDER — PANTOPRAZOLE SODIUM 40 MG PO TBEC: DELAYED_RELEASE_TABLET | ORAL | Status: DC

## 2013-11-04 ENCOUNTER — Encounter: Payer: Self-pay | Admitting: Internal Medicine

## 2013-11-12 ENCOUNTER — Other Ambulatory Visit: Payer: Self-pay | Admitting: Cardiovascular Disease

## 2013-11-12 ENCOUNTER — Other Ambulatory Visit: Payer: Medicare Other

## 2013-11-13 ENCOUNTER — Other Ambulatory Visit (INDEPENDENT_AMBULATORY_CARE_PROVIDER_SITE_OTHER): Payer: Medicare Other

## 2013-11-13 ENCOUNTER — Ambulatory Visit (INDEPENDENT_AMBULATORY_CARE_PROVIDER_SITE_OTHER): Payer: Medicare Other | Admitting: Internal Medicine

## 2013-11-13 ENCOUNTER — Encounter: Payer: Self-pay | Admitting: Internal Medicine

## 2013-11-13 VITALS — BP 107/63 | HR 69 | Ht 64.0 in | Wt 151.0 lb

## 2013-11-13 DIAGNOSIS — I2510 Atherosclerotic heart disease of native coronary artery without angina pectoris: Secondary | ICD-10-CM

## 2013-11-13 DIAGNOSIS — I498 Other specified cardiac arrhythmias: Secondary | ICD-10-CM

## 2013-11-13 DIAGNOSIS — E785 Hyperlipidemia, unspecified: Secondary | ICD-10-CM

## 2013-11-13 DIAGNOSIS — I471 Supraventricular tachycardia: Secondary | ICD-10-CM

## 2013-11-13 DIAGNOSIS — I2589 Other forms of chronic ischemic heart disease: Secondary | ICD-10-CM

## 2013-11-13 DIAGNOSIS — I5022 Chronic systolic (congestive) heart failure: Secondary | ICD-10-CM

## 2013-11-13 DIAGNOSIS — I255 Ischemic cardiomyopathy: Secondary | ICD-10-CM

## 2013-11-13 DIAGNOSIS — Z95810 Presence of automatic (implantable) cardiac defibrillator: Secondary | ICD-10-CM

## 2013-11-13 LAB — BASIC METABOLIC PANEL
BUN: 11 mg/dL (ref 6–23)
CO2: 26 meq/L (ref 19–32)
Calcium: 9.5 mg/dL (ref 8.4–10.5)
Chloride: 105 mEq/L (ref 96–112)
Creatinine, Ser: 1 mg/dL (ref 0.4–1.2)
GFR: 64.7 mL/min (ref >60.00)
GLUCOSE: 96 mg/dL (ref 70–99)
Potassium: 3.6 mEq/L (ref 3.5–5.1)
SODIUM: 138 meq/L (ref 135–145)

## 2013-11-13 LAB — MDC_IDC_ENUM_SESS_TYPE_INCLINIC
Battery Voltage: 3.03 V
Date Time Interrogation Session: 20150225155839
HighPow Impedance: 72 Ohm
Lead Channel Impedance Value: 703 Ohm
Lead Channel Pacing Threshold Pulse Width: 0.4 ms
Lead Channel Sensing Intrinsic Amplitude: 16.5 mV
Lead Channel Setting Pacing Amplitude: 2.5 V
Lead Channel Setting Pacing Pulse Width: 0.4 ms
Lead Channel Setting Sensing Sensitivity: 0.3 mV
MDC IDC MSMT LEADCHNL RV PACING THRESHOLD AMPLITUDE: 1 V
MDC IDC SET ZONE DETECTION INTERVAL: 280 ms
MDC IDC SET ZONE DETECTION INTERVAL: 340 ms
MDC IDC STAT BRADY RV PERCENT PACED: 0 %
Zone Setting Detection Interval: 450 ms

## 2013-11-13 LAB — LIPID PANEL
CHOL/HDL RATIO: 4
Cholesterol: 131 mg/dL (ref 0–200)
HDL: 36 mg/dL — AB (ref >39.00)
LDL Cholesterol: 58 mg/dL (ref 0–99)
Triglycerides: 186 mg/dL — ABNORMAL HIGH (ref 0.0–149.0)
VLDL: 37.2 mg/dL (ref 0.0–40.0)

## 2013-11-13 LAB — HEPATIC FUNCTION PANEL
ALK PHOS: 114 U/L (ref 39–117)
ALT: 14 U/L (ref 0–35)
AST: 17 U/L (ref 0–37)
Albumin: 4.1 g/dL (ref 3.5–5.2)
Bilirubin, Direct: 0 mg/dL (ref 0.0–0.3)
Total Bilirubin: 0.5 mg/dL (ref 0.3–1.2)
Total Protein: 7.6 g/dL (ref 6.0–8.3)

## 2013-11-13 NOTE — Patient Instructions (Signed)
Your physician recommends that you schedule a follow-up appointment in: June with Dr. Burt Knack  Your physician wants you to follow-up in: January 2016 with Dr. Caryl Comes.  You will receive a reminder letter in the mail two months in advance. If you don't receive a letter, please call our office to schedule the follow-up appointment.  Your physician recommends that you continue on your current medications as directed. Please refer to the Current Medication list given to you today.

## 2013-11-13 NOTE — Progress Notes (Signed)
Patient Care Team: Redmond School, MD as PCP - General (Internal Medicine)   HPI  [name] [name] is a 58 y.o. female  seen in followup for an ICD implanted for primary prevention OCT 2011 . She is a 58 year-old woman s/p anterior MI 2010, who underwent primary PCI at that time. She had persisent severe LV dysfunction   Most recent echo 2/14 demonstrated EF 35-40%    Inappropriate shocks for SVT occurred on Christmas Eve.  The patient denies chest pain, shortness of breath, nocturnal dyspnea, orthopnea or peripheral edema. None further since diuretic There have been no palpitations, lightheadedness or syncope.   Her voice sounds much better   Past Medical History  Diagnosis Date  . Ischemic cardiomyopathy     post-MI LVEF less than 30%; acute AMI Rx w BMS>>LAD  . ICD (implantable cardiac defibrillator), single MDT     treated with BMS to LAD  . Dyslipidemia   . GERD (gastroesophageal reflux disease)   . Depression   . Cervical disc disease     Past Surgical History  Procedure Laterality Date  . Cervical disc surgery    . Partial right masectomy    . Coronary angioplasty with stent placement    . Icd implanted      Current Outpatient Prescriptions  Medication Sig Dispense Refill  . acetaminophen (TYLENOL) 325 MG tablet Take 650 mg by mouth every 6 (six) hours as needed.        Marland Kitchen aspirin EC 81 MG EC tablet Take 1 tablet (81 mg total) by mouth daily.  30 tablet  6  . carvedilol (COREG) 25 MG tablet Take 1 tablet (25 mg total) by mouth 2 (two) times daily.  180 tablet  3  . DULoxetine (CYMBALTA) 30 MG capsule Take 30 mg by mouth daily.        . furosemide (LASIX) 20 MG tablet One tablet every other day as needed for swelling  30 tablet  6  . losartan (COZAAR) 50 MG tablet TAKE 1 TABLET BY MOUTH EVERY DAY  90 tablet  0  . nitroGLYCERIN (NITROSTAT) 0.4 MG SL tablet Place 0.4 mg under the tongue every 5 (five) minutes as needed. As directed as needed       .  pantoprazole (PROTONIX) 40 MG tablet TAKE 1 TABLET BY MOUTH EVERY DAY  30 tablet  1  . potassium chloride SA (K-DUR,KLOR-CON) 20 MEQ tablet TAKE 2 TABLETS BY MOUTH EVERY DAY  60 tablet  0  . rosuvastatin (CRESTOR) 40 MG tablet TAKE 1 TABLET BY MOUTH EVERY EVENING  30 tablet  5   No current facility-administered medications for this visit.    No Known Allergies  Review of Systems negative except from HPI and PMH  Physical Exam BP 107/63  Pulse 69  Ht 5\' 4"  (1.626 m)  Wt 151 lb (68.493 kg)  BMI 25.91 kg/m2 Well developed and well nourished in no acute distress HENT normal E scleral and icterus clear Neck Supple JVP flat; carotids brisk and full Clear to ausculation Device pocket well healed; without hematoma or erythema.  There is no tethering  Regular rate and rhythm, no murmurs gallops or rub Soft with active bowel sounds No clubbing cyanosis none Edema Alert and oriented, grossly normal motor and sensory function Skin Warm and Dry    Assessment and  Plan  Ischemic Cardiomyopathy  Stable on current meds  CHF systolic chronic  euvolemic  Blood work drawn today, she  takes lots of K  VT No intercurrent Ventricular tachycardia  ICD Medtronic  The patient's device was interrogated.  The information was reviewed. No changes were made in the programming  Hypokalemia.  Check Bme5t today

## 2013-11-18 ENCOUNTER — Ambulatory Visit: Payer: Medicare Other | Admitting: Cardiovascular Disease

## 2013-11-29 ENCOUNTER — Other Ambulatory Visit: Payer: Self-pay | Admitting: Cardiology

## 2013-12-12 ENCOUNTER — Other Ambulatory Visit: Payer: Self-pay | Admitting: Cardiovascular Disease

## 2013-12-24 ENCOUNTER — Other Ambulatory Visit: Payer: Self-pay | Admitting: Cardiovascular Disease

## 2014-01-01 ENCOUNTER — Other Ambulatory Visit: Payer: Self-pay | Admitting: Internal Medicine

## 2014-01-28 ENCOUNTER — Other Ambulatory Visit: Payer: Self-pay | Admitting: Cardiovascular Disease

## 2014-02-08 ENCOUNTER — Other Ambulatory Visit: Payer: Self-pay | Admitting: Cardiovascular Disease

## 2014-02-17 ENCOUNTER — Telehealth: Payer: Self-pay | Admitting: Cardiology

## 2014-02-17 ENCOUNTER — Ambulatory Visit (INDEPENDENT_AMBULATORY_CARE_PROVIDER_SITE_OTHER): Payer: Medicare Other | Admitting: *Deleted

## 2014-02-17 DIAGNOSIS — I5022 Chronic systolic (congestive) heart failure: Secondary | ICD-10-CM

## 2014-02-17 DIAGNOSIS — I255 Ischemic cardiomyopathy: Secondary | ICD-10-CM

## 2014-02-17 DIAGNOSIS — I2589 Other forms of chronic ischemic heart disease: Secondary | ICD-10-CM

## 2014-02-17 DIAGNOSIS — Z95810 Presence of automatic (implantable) cardiac defibrillator: Secondary | ICD-10-CM

## 2014-02-17 LAB — MDC_IDC_ENUM_SESS_TYPE_REMOTE
Brady Statistic RV Percent Paced: 0.1 % — CL
HighPow Impedance: 71 Ohm
Lead Channel Impedance Value: 684 Ohm
Lead Channel Sensing Intrinsic Amplitude: 14.4 mV
Lead Channel Setting Sensing Sensitivity: 0.3 mV
MDC IDC SET LEADCHNL RV PACING AMPLITUDE: 2.5 V
MDC IDC SET LEADCHNL RV PACING PULSEWIDTH: 0.4 ms
MDC IDC SET ZONE DETECTION INTERVAL: 450 ms
Zone Setting Detection Interval: 280 ms
Zone Setting Detection Interval: 340 ms

## 2014-02-17 NOTE — Telephone Encounter (Signed)
LMOVM reminding pt to send remote transmission.   

## 2014-02-17 NOTE — Progress Notes (Signed)
Remote ICD transmission.   

## 2014-03-18 ENCOUNTER — Encounter: Payer: Self-pay | Admitting: Cardiology

## 2014-03-19 ENCOUNTER — Encounter: Payer: Self-pay | Admitting: Internal Medicine

## 2014-04-29 ENCOUNTER — Other Ambulatory Visit: Payer: Self-pay | Admitting: Cardiovascular Disease

## 2014-05-14 ENCOUNTER — Encounter: Payer: Self-pay | Admitting: Cardiovascular Disease

## 2014-05-14 ENCOUNTER — Ambulatory Visit (INDEPENDENT_AMBULATORY_CARE_PROVIDER_SITE_OTHER): Payer: Medicare Other | Admitting: Cardiovascular Disease

## 2014-05-14 VITALS — BP 100/60 | HR 66 | Ht 64.0 in | Wt 149.0 lb

## 2014-05-14 DIAGNOSIS — E785 Hyperlipidemia, unspecified: Secondary | ICD-10-CM

## 2014-05-14 DIAGNOSIS — I5022 Chronic systolic (congestive) heart failure: Secondary | ICD-10-CM

## 2014-05-14 DIAGNOSIS — I2510 Atherosclerotic heart disease of native coronary artery without angina pectoris: Secondary | ICD-10-CM

## 2014-05-14 NOTE — Patient Instructions (Addendum)
Your physician wants you to follow-up in: 1 YEAR with Dr Burt Knack.  You will receive a reminder letter in the mail two months in advance. If you don't receive a letter, please call our office to schedule the follow-up appointment.  Your physician recommends that you continue on your current medications as directed. Please refer to the Current Medication list given to you today.  Your physician recommends that you return for a FASTING LIPID, LIVER and BMP in February 2016--nothing to eat or drink after midnight, lab opens at 7:30  AM

## 2014-05-14 NOTE — Progress Notes (Signed)
    HPI:  58 year old woman presenting for followup evaluation. The patient has a severe ischemic cardiomyopathy following an anterior wall myocardial infarction. She was treated with stenting of the LAD at the time of her MI. She underwent ICD implantation. Her most recent echocardiogram from February 2014 showed her ejection fraction has improved to the range of 35-40%.  The patient continues to do well from a symptomatic perspective. He denies chest pain, chest pressure, shortness of breath, edema, or heart palpitations. She walks her 90 pound boxer daily for exercise. She has no exertional symptoms.   Outpatient Encounter Prescriptions as of 05/14/2014  Medication Sig  . acetaminophen (TYLENOL) 325 MG tablet Take 650 mg by mouth every 6 (six) hours as needed.    Marland Kitchen aspirin EC 81 MG EC tablet Take 1 tablet (81 mg total) by mouth daily.  . carvedilol (COREG) 25 MG tablet TAKE 1 TABLET BY MOUTH TWICE DAILY  . CRESTOR 40 MG tablet TAKE 1 TABLET BY MOUTH EVERY EVENING  . DULoxetine (CYMBALTA) 30 MG capsule Take 30 mg by mouth daily.    . furosemide (LASIX) 20 MG tablet TAKE 1 TABLET BY MOUTH EVERY OTHER DAY AS NEEDED FOR SWELLIING  . losartan (COZAAR) 50 MG tablet TAKE 1 TABLET BY MOUTH EVERY DAY  . nitroGLYCERIN (NITROSTAT) 0.4 MG SL tablet Place 0.4 mg under the tongue every 5 (five) minutes as needed. As directed as needed   . pantoprazole (PROTONIX) 40 MG tablet TAKE 1 TABLET BY MOUTH EVERY DAY  . potassium chloride SA (K-DUR,KLOR-CON) 20 MEQ tablet TAKE 2 TABLETS BY MOUTH EVERY DAY    No Known Allergies  Past Medical History  Diagnosis Date  . Ischemic cardiomyopathy     post-MI LVEF less than 30%; acute AMI Rx w BMS>>LAD  . ICD (implantable cardiac defibrillator), single MDT     treated with BMS to LAD  . Dyslipidemia   . GERD (gastroesophageal reflux disease)   . Depression   . Cervical disc disease     ROS: Negative except as per HPI  BP 100/60  Pulse 66  Ht 5\' 4"  (1.626  m)  Wt 149 lb (67.586 kg)  BMI 25.56 kg/m2  PHYSICAL EXAM: Pt is alert and oriented, NAD HEENT: normal Neck: JVP - normal, carotids 2+= without bruits Lungs: CTA bilaterally CV: RRR without murmur or gallop Abd: soft, NT, Positive BS, no hepatomegaly Ext: no C/C/E, distal pulses intact and equal Skin: warm/dry no rash  EKG:  Normal sinus rhythm 66 beats per minute, age-indeterminate septal infarct, nonspecific ST abnormality.  ASSESSMENT AND PLAN: 1. Coronary atherosclerosis, native vessel, without angina. The patient remained stable. She will continue on her current medical program which includes aspirin, carvedilol, and Crestor. Plavix was stopped at the time of last years visit. She's had no recurrent ischemic event since her initial presentation.  2. Severe ischemic cardiomyopathy, New York Heart Association functional class I symptoms. The patient is now status post ICD implantation. She will continue her current medical program. She follows with Dr. Caryl Comes.  3. Hyperlipidemia. Lipids and LFTs were reviewed from February. We'll repeat lab work next year.  Sherren Mocha 05/14/2014 4:38 PM

## 2014-05-21 ENCOUNTER — Telehealth: Payer: Self-pay | Admitting: Internal Medicine

## 2014-05-21 ENCOUNTER — Telehealth: Payer: Self-pay | Admitting: Cardiology

## 2014-05-21 ENCOUNTER — Encounter: Payer: Medicare Other | Admitting: *Deleted

## 2014-05-21 NOTE — Telephone Encounter (Signed)
Spoke with pt and reminded pt of remote transmission that is due today. Pt verbalized understanding.   

## 2014-05-21 NOTE — Telephone Encounter (Signed)
Informed pt that we did not receive transmission and requested that pt call tech support to receive help with trouble shooting monitor.

## 2014-05-21 NOTE — Telephone Encounter (Signed)
NEW PROBLEM:    Pt called to make sure her transmission went through, at the end the machine kept lighting up.  Pt not sure it worked. Please give her a call.

## 2014-05-22 ENCOUNTER — Encounter: Payer: Self-pay | Admitting: Cardiology

## 2014-05-22 ENCOUNTER — Other Ambulatory Visit: Payer: Self-pay | Admitting: Cardiovascular Disease

## 2014-05-29 ENCOUNTER — Ambulatory Visit (INDEPENDENT_AMBULATORY_CARE_PROVIDER_SITE_OTHER): Payer: Medicare Other | Admitting: *Deleted

## 2014-05-29 DIAGNOSIS — I255 Ischemic cardiomyopathy: Secondary | ICD-10-CM

## 2014-05-29 DIAGNOSIS — I5022 Chronic systolic (congestive) heart failure: Secondary | ICD-10-CM

## 2014-05-29 DIAGNOSIS — I2589 Other forms of chronic ischemic heart disease: Secondary | ICD-10-CM

## 2014-05-29 NOTE — Progress Notes (Signed)
Remote ICD transmission.   

## 2014-05-30 LAB — MDC_IDC_ENUM_SESS_TYPE_REMOTE
Brady Statistic RV Percent Paced: 0 %
Date Time Interrogation Session: 20150910184411
HIGH POWER IMPEDANCE MEASURED VALUE: 71 Ohm
Lead Channel Sensing Intrinsic Amplitude: 19.5 mV
Lead Channel Sensing Intrinsic Amplitude: 19.5 mV
Lead Channel Setting Pacing Amplitude: 2.5 V
Lead Channel Setting Pacing Pulse Width: 0.4 ms
Lead Channel Setting Sensing Sensitivity: 0.3 mV
MDC IDC MSMT BATTERY VOLTAGE: 3 V
MDC IDC MSMT LEADCHNL RV IMPEDANCE VALUE: 741 Ohm
MDC IDC SET ZONE DETECTION INTERVAL: 450 ms
Zone Setting Detection Interval: 280 ms
Zone Setting Detection Interval: 340 ms

## 2014-06-23 ENCOUNTER — Encounter: Payer: Self-pay | Admitting: Cardiology

## 2014-07-01 ENCOUNTER — Encounter: Payer: Self-pay | Admitting: Internal Medicine

## 2014-09-09 ENCOUNTER — Encounter: Payer: Medicare Other | Admitting: Internal Medicine

## 2014-09-27 ENCOUNTER — Other Ambulatory Visit: Payer: Self-pay | Admitting: Internal Medicine

## 2014-10-09 ENCOUNTER — Encounter: Payer: Self-pay | Admitting: Internal Medicine

## 2014-10-09 ENCOUNTER — Ambulatory Visit (INDEPENDENT_AMBULATORY_CARE_PROVIDER_SITE_OTHER): Payer: Medicare Other | Admitting: Internal Medicine

## 2014-10-09 VITALS — BP 96/60 | HR 62 | Ht 64.0 in | Wt 152.6 lb

## 2014-10-09 DIAGNOSIS — I5022 Chronic systolic (congestive) heart failure: Secondary | ICD-10-CM

## 2014-10-09 DIAGNOSIS — I255 Ischemic cardiomyopathy: Secondary | ICD-10-CM

## 2014-10-09 DIAGNOSIS — Z4502 Encounter for adjustment and management of automatic implantable cardiac defibrillator: Secondary | ICD-10-CM

## 2014-10-09 LAB — MDC_IDC_ENUM_SESS_TYPE_INCLINIC
Battery Voltage: 2.99 V
Brady Statistic RV Percent Paced: 0.01 %
HIGH POWER IMPEDANCE MEASURED VALUE: 74 Ohm
Lead Channel Impedance Value: 741 Ohm
Lead Channel Pacing Threshold Amplitude: 1.25 V
Lead Channel Sensing Intrinsic Amplitude: 16.5 mV
Lead Channel Sensing Intrinsic Amplitude: 19.5 mV
Lead Channel Setting Pacing Amplitude: 2.5 V
Lead Channel Setting Pacing Pulse Width: 0.4 ms
Lead Channel Setting Sensing Sensitivity: 0.3 mV
MDC IDC MSMT LEADCHNL RV PACING THRESHOLD PULSEWIDTH: 0.4 ms
MDC IDC SESS DTM: 20160121145324
MDC IDC SET ZONE DETECTION INTERVAL: 280 ms
MDC IDC SET ZONE DETECTION INTERVAL: 340 ms
MDC IDC SET ZONE DETECTION INTERVAL: 450 ms

## 2014-10-09 NOTE — Patient Instructions (Addendum)
Your physician recommends that you continue on your current medications as directed. Please refer to the Current Medication list given to you today.  Your physician recommends that you schedule a follow-up appointment in: 3  Months with Dr. Burt Knack with lab work at visit.  Remote monitoring is used to monitor your Pacemaker of ICD from home. This monitoring reduces the number of office visits required to check your device to one time per year. It allows [place] to keep an eye on the functioning of your device to ensure it is working properly. You are scheduled for a device check from home on 01/08/15. You may send your transmission at any time that day. If you have a wireless device, the transmission will be sent automatically. After your physician reviews your transmission, you will receive a postcard with your next transmission date.  Your physician wants you to follow-up in: 1 year with Dr. Caryl Comes.  You will receive a reminder letter in the mail two months in advance. If you don't receive a letter, please call our office to schedule the follow-up appointment.

## 2014-10-09 NOTE — Progress Notes (Signed)
Patient Care Team: Redmond School, MD as PCP - General (Internal Medicine)   HPI  [name] [name] is a 59 y.o. female  seen in followup for an ICD implanted for primary prevention OCT 2011 . She is a 59 year-old woman s/p anterior MI 2010, who underwent primary PCI at that time. She had persisent severe LV dysfunction   Most recent echo 2/14 demonstrated EF 35-40%    Inappropriate shocks for SVT occurred on Christmas Eve.  The patient denies chest pain, shortness of breath, nocturnal dyspnea, orthopnea or peripheral edema. None further since diuretic There have been no palpitations, lightheadedness or syncope.   Her voice sounds much better   Past Medical History  Diagnosis Date  . Ischemic cardiomyopathy     post-MI LVEF less than 30%; acute AMI Rx w BMS>>LAD  . ICD (implantable cardiac defibrillator), single MDT     treated with BMS to LAD  . Dyslipidemia   . GERD (gastroesophageal reflux disease)   . Depression   . Cervical disc disease     Past Surgical History  Procedure Laterality Date  . Cervical disc surgery    . Partial right masectomy    . Coronary angioplasty with stent placement    . Icd implanted      Current Outpatient Prescriptions  Medication Sig Dispense Refill  . acetaminophen (TYLENOL) 325 MG tablet Take 650 mg by mouth every 6 (six) hours as needed.      Marland Kitchen aspirin EC 81 MG EC tablet Take 1 tablet (81 mg total) by mouth daily. 30 tablet 6  . carvedilol (COREG) 25 MG tablet TAKE 1 TABLET BY MOUTH TWICE DAILY 180 tablet 0  . CRESTOR 40 MG tablet TAKE 1 TABLET BY MOUTH EVERY EVENING 30 tablet 11  . DULoxetine (CYMBALTA) 30 MG capsule Take 30 mg by mouth daily.      . furosemide (LASIX) 20 MG tablet TAKE 1 TABLET BY MOUTH EVERY OTHER DAY AS NEEDED FOR SWEELING 30 tablet 10  . losartan (COZAAR) 50 MG tablet TAKE 1 TABLET BY MOUTH EVERY DAY 90 tablet 3  . nitroGLYCERIN (NITROSTAT) 0.4 MG SL tablet Place 0.4 mg under the tongue every 5 (five) minutes  as needed. As directed as needed     . pantoprazole (PROTONIX) 40 MG tablet TAKE 1 TABLET BY MOUTH EVERY DAY 30 tablet 11  . potassium chloride SA (K-DUR,KLOR-CON) 20 MEQ tablet TAKE 2 TABLETS BY MOUTH EVERY DAY 60 tablet 2   No current facility-administered medications for this visit.    No Known Allergies  Review of Systems negative except from HPI and PMH  Physical Exam BP 96/60 mmHg  Pulse 62  Ht 5\' 4"  (1.626 m)  Wt 152 lb 9.6 oz (69.219 kg)  BMI 26.18 kg/m2 Well developed and well nourished in no acute distress HENT normal E scleral and icterus clear Neck Supple JVP flat; carotids brisk and full Clear to ausculation Device pocket well healed; without hematoma or erythema.  There is no tethering  Regular rate and rhythm, no murmurs gallops or rub Soft with active bowel sounds No clubbing cyanosis none Edema Alert and oriented, grossly normal motor and sensory function Skin Warm and Dry    Assessment and  Plan  Ischemic Cardiomyopathy  Stable on current meds  CHF systolic chronic  euvolemic  Blood work drawn today, she takes lots of K  VT No intercurrent Ventricular tachycardia  ICD Medtronic  The patient's device was interrogated.  The information was reviewed. No changes were made in the programming   Doing weell will see in one yaer

## 2014-10-20 ENCOUNTER — Other Ambulatory Visit: Payer: Medicare Other

## 2014-11-10 ENCOUNTER — Encounter: Payer: Self-pay | Admitting: Internal Medicine

## 2014-12-23 ENCOUNTER — Other Ambulatory Visit: Payer: Self-pay | Admitting: Internal Medicine

## 2014-12-26 ENCOUNTER — Ambulatory Visit (INDEPENDENT_AMBULATORY_CARE_PROVIDER_SITE_OTHER): Payer: Medicare Other | Admitting: Cardiovascular Disease

## 2014-12-26 ENCOUNTER — Other Ambulatory Visit: Payer: Self-pay

## 2014-12-26 ENCOUNTER — Encounter: Payer: Self-pay | Admitting: Cardiovascular Disease

## 2014-12-26 ENCOUNTER — Other Ambulatory Visit (INDEPENDENT_AMBULATORY_CARE_PROVIDER_SITE_OTHER): Payer: Medicare Other | Admitting: *Deleted

## 2014-12-26 VITALS — BP 102/50 | HR 63 | Ht 64.0 in | Wt 145.8 lb

## 2014-12-26 DIAGNOSIS — I2510 Atherosclerotic heart disease of native coronary artery without angina pectoris: Secondary | ICD-10-CM

## 2014-12-26 DIAGNOSIS — I5022 Chronic systolic (congestive) heart failure: Secondary | ICD-10-CM | POA: Diagnosis not present

## 2014-12-26 DIAGNOSIS — E785 Hyperlipidemia, unspecified: Secondary | ICD-10-CM

## 2014-12-26 DIAGNOSIS — E876 Hypokalemia: Secondary | ICD-10-CM

## 2014-12-26 LAB — BASIC METABOLIC PANEL
BUN: 12 mg/dL (ref 6–23)
CO2: 26 mEq/L (ref 19–32)
Calcium: 9.6 mg/dL (ref 8.4–10.5)
Chloride: 102 mEq/L (ref 96–112)
Creatinine, Ser: 1.4 mg/dL — ABNORMAL HIGH (ref 0.40–1.20)
GFR: 41.19 mL/min — ABNORMAL LOW (ref >60.00)
Glucose, Bld: 97 mg/dL (ref 70–99)
Potassium: 2.9 mEq/L — ABNORMAL LOW (ref 3.5–5.1)
SODIUM: 135 meq/L (ref 135–145)

## 2014-12-26 LAB — LIPID PANEL
CHOL/HDL RATIO: 3
Cholesterol: 100 mg/dL (ref 0–200)
HDL: 30.8 mg/dL — ABNORMAL LOW (ref >39.00)
LDL Cholesterol: 35 mg/dL (ref 0–99)
NonHDL: 69.2
Triglycerides: 170 mg/dL — ABNORMAL HIGH (ref 0.0–149.0)
VLDL: 34 mg/dL (ref 0.0–40.0)

## 2014-12-26 LAB — HEPATIC FUNCTION PANEL
ALT: 12 U/L (ref 0–35)
AST: 15 U/L (ref 0–37)
Albumin: 4.1 g/dL (ref 3.5–5.2)
Alkaline Phosphatase: 99 U/L (ref 39–117)
Bilirubin, Direct: 0.1 mg/dL (ref 0.0–0.3)
Total Bilirubin: 0.4 mg/dL (ref 0.2–1.2)
Total Protein: 7 g/dL (ref 6.0–8.3)

## 2014-12-26 MED ORDER — POTASSIUM CHLORIDE CRYS ER 20 MEQ PO TBCR: 40.0000 meq | EXTENDED_RELEASE_TABLET | Freq: Every day | ORAL | Status: DC

## 2014-12-26 NOTE — Patient Instructions (Signed)
Your physician wants you to follow-up in: 1 YEAR with Dr Cooper.  You will receive a reminder letter in the mail two months in advance. If you don't receive a letter, please call our office to schedule the follow-up appointment.  Your physician recommends that you continue on your current medications as directed. Please refer to the Current Medication list given to you today.  

## 2014-12-26 NOTE — Progress Notes (Signed)
Cardiology Office Note   Date:  12/26/2014   ID:  [name] [name], DOB 1956-03-31, MRN [id number]  PCP:  Glo Herring., MD  Cardiologist:  Sherren Mocha, MD    Chief Complaint  Patient presents with  . Cardiomyopathy    History of Present Illness: [name] [name] is a 59 y.o. female who presents for follow-up of ischemic cardiomyopathy. The patient initially presented with an anterior wall MI in 2010 and was treated with stenting of her LAD. She had residual severe LV dysfunction despite optimal medical therapy. She underwent ICD implantation. She has been remarkably stable over the years without symptoms of angina or congestive heart failure.  The patient continues to do well. She has no chest pain, chest pressure, shortness of breath, edema, lightheadedness, or heart palpitations. She had worsening of her depression in January. She was evaluated by her primary physician and her Cymbalta dose was increased. This has worked very well for her with marked improvement in her mood.  Past Medical History  Diagnosis Date  . Ischemic cardiomyopathy     post-MI LVEF less than 30%; acute AMI Rx w BMS>>LAD  . ICD (implantable cardiac defibrillator), single MDT     treated with BMS to LAD  . Dyslipidemia   . GERD (gastroesophageal reflux disease)   . Depression   . Cervical disc disease     Past Surgical History  Procedure Laterality Date  . Cervical disc surgery    . Partial right masectomy    . Coronary angioplasty with stent placement    . Icd implanted      Current Outpatient Prescriptions  Medication Sig Dispense Refill  . acetaminophen (TYLENOL) 325 MG tablet Take 650 mg by mouth every 6 (six) hours as needed.      Marland Kitchen aspirin EC 81 MG EC tablet Take 1 tablet (81 mg total) by mouth daily. 30 tablet 6  . carvedilol (COREG) 25 MG tablet TAKE 1 TABLET BY MOUTH TWICE DAILY 180 tablet 0  . CRESTOR 40 MG tablet TAKE 1 TABLET BY MOUTH EVERY EVENING 30 tablet 11  . DULoxetine (CYMBALTA)  60 MG capsule Take 60 mg by mouth daily.   3  . furosemide (LASIX) 20 MG tablet TAKE 1 TABLET BY MOUTH EVERY OTHER DAY AS NEEDED FOR SWEELING 30 tablet 10  . losartan (COZAAR) 50 MG tablet TAKE 1 TABLET BY MOUTH EVERY DAY 90 tablet 3  . nitroGLYCERIN (NITROSTAT) 0.4 MG SL tablet Place 0.4 mg under the tongue every 5 (five) minutes as needed. As directed as needed     . pantoprazole (PROTONIX) 40 MG tablet TAKE 1 TABLET BY MOUTH EVERY DAY 30 tablet 11  . potassium chloride SA (K-DUR,KLOR-CON) 20 MEQ tablet Take 2 tablets (40 mEq total) by mouth daily. 180 tablet 3   No current facility-administered medications for this visit.    Allergies:   Review of patient's allergies indicates no known allergies.   Social History:  The patient  reports that she has quit smoking. She does not have any smokeless tobacco history on file. She reports that she does not drink alcohol or use illicit drugs.   Family History:  The patient's  family history includes Heart attack in her mother.    ROS:  Please see the history of present illness.  Otherwise, review of systems is positive for depression and anxiety.  All other systems are reviewed and negative.    PHYSICAL EXAM: VS:  BP 102/50 mmHg  Pulse 63  Ht 5\' 4"  (1.626 m)  Wt 145 lb 12.8 oz (66.134 kg)  BMI 25.01 kg/m2  SpO2 98% , BMI Body mass index is 25.01 kg/(m^2). GEN: Well nourished, well developed, in no acute distress HEENT: normal Neck: no JVD, no masses. No carotid bruits Cardiac: RRR without murmur or gallop                Respiratory:  clear to auscultation bilaterally, normal work of breathing GI: soft, nontender, nondistended, + BS MS: no deformity or atrophy Ext: no pretibial edema, pedal pulses 2+= bilaterally Skin: warm and dry, no rash Neuro:  Strength and sensation are intact Psych: euthymic mood, full affect  EKG:  EKG is ordered today. The ekg ordered today shows sinus bradycardia 55 bpm, age-indeterminate septal infarct, ST  and T-wave abnormality consider anterolateral ischemia.  Recent Labs: No results found for requested labs within last 365 days.   Lipid Panel     Component Value Date/Time   CHOL 131 11/13/2013 1000   TRIG 186.0* 11/13/2013 1000   HDL 36.00* 11/13/2013 1000   CHOLHDL 4 11/13/2013 1000   VLDL 37.2 11/13/2013 1000   LDLCALC 58 11/13/2013 1000   LDLDIRECT 70.8 10/27/2010 1020      Wt Readings from Last 3 Encounters:  12/26/14 145 lb 12.8 oz (66.134 kg)  10/09/14 152 lb 9.6 oz (69.219 kg)  05/14/14 149 lb (67.586 kg)     Cardiac Studies Reviewed: 2-D echocardiogram 10/29/2012: Study Conclusions  - Left ventricle: The cavity size was mildly dilated. Wall thickness was normal. Systolic function was moderately reduced. The estimated ejection fraction was in the range of 35% to 40%. There is akinesis of the mid-distalanteroseptal myocardium. There is akinesis and scarring of the entireinferior and inferoseptal myocardium. Features are consistent with a pseudonormal left ventricular filling pattern, with concomitant abnormal relaxation and increased filling pressure (grade 2 diastolic dysfunction). - Pulmonary arteries: PA peak pressure: 26mm Hg (S).  ASSESSMENT AND PLAN: 1.  CAD, native vessel: The patient has no symptoms of angina. She will continue on aspirin for antiplatelet therapy, beta blocker, and a statin drug.  2. Ischemic cardiomyopathy, patient with New York Heart Association functional class I symptoms. She is on a good medical program with carvedilol and losartan. Last echo reviewed as above. I will see her back in one year.  3. Status post ICD: Followed by Dr. Caryl Comes. No ICD discharges.  4. Hyperlipidemia: The patient is treated with high-dose Crestor. Lipids and LFTs were drawn today.   Current medicines are reviewed with the patient today.  The patient does not have concerns regarding medicines.  The following changes have been made:  no  change  Labs/ tests ordered today include:   Orders Placed This Encounter  Procedures  . EKG 12-Lead   Disposition:   FU one year  Signed, Sherren Mocha, MD  12/26/2014 8:34 AM    [facility] [address], [place], [place]  [address] Phone: [phone]; Fax: [phone]

## 2014-12-26 NOTE — Addendum Note (Signed)
Addended by: Eulis Foster on: 12/26/2014 07:35 AM   Modules accepted: Orders

## 2014-12-31 ENCOUNTER — Other Ambulatory Visit: Payer: Self-pay | Admitting: Internal Medicine

## 2015-01-02 ENCOUNTER — Other Ambulatory Visit (INDEPENDENT_AMBULATORY_CARE_PROVIDER_SITE_OTHER): Payer: Medicare Other | Admitting: *Deleted

## 2015-01-02 DIAGNOSIS — E876 Hypokalemia: Secondary | ICD-10-CM | POA: Diagnosis not present

## 2015-01-02 LAB — BASIC METABOLIC PANEL
BUN: 10 mg/dL (ref 6–23)
CHLORIDE: 104 meq/L (ref 96–112)
CO2: 24 meq/L (ref 19–32)
Calcium: 9.4 mg/dL (ref 8.4–10.5)
Creatinine, Ser: 1.19 mg/dL (ref 0.40–1.20)
GFR: 49.69 mL/min — ABNORMAL LOW (ref >60.00)
GLUCOSE: 93 mg/dL (ref 70–99)
Potassium: 3.5 mEq/L (ref 3.5–5.1)
Sodium: 133 mEq/L — ABNORMAL LOW (ref 135–145)

## 2015-01-05 ENCOUNTER — Telehealth: Payer: Self-pay | Admitting: Cardiovascular Disease

## 2015-01-05 NOTE — Telephone Encounter (Signed)
Called patient, informed her that lab note has not been resulted. Patient verbalized understanding.

## 2015-01-05 NOTE — Telephone Encounter (Signed)
New message ° ° °Pt calling for lab results °

## 2015-01-08 ENCOUNTER — Ambulatory Visit (INDEPENDENT_AMBULATORY_CARE_PROVIDER_SITE_OTHER): Payer: Medicare Other | Admitting: *Deleted

## 2015-01-08 DIAGNOSIS — I5022 Chronic systolic (congestive) heart failure: Secondary | ICD-10-CM

## 2015-01-08 DIAGNOSIS — I255 Ischemic cardiomyopathy: Secondary | ICD-10-CM

## 2015-01-08 NOTE — Progress Notes (Signed)
Remote ICD transmission.   

## 2015-01-11 LAB — MDC_IDC_ENUM_SESS_TYPE_REMOTE
Date Time Interrogation Session: 20160421143259
HighPow Impedance: 75 Ohm
Lead Channel Impedance Value: 703 Ohm
Lead Channel Sensing Intrinsic Amplitude: 14.75 mV
Lead Channel Setting Pacing Amplitude: 2.5 V
Lead Channel Setting Pacing Pulse Width: 0.4 ms
Lead Channel Setting Sensing Sensitivity: 0.3 mV
MDC IDC MSMT BATTERY VOLTAGE: 2.98 V
MDC IDC MSMT LEADCHNL RV SENSING INTR AMPL: 14.75 mV
MDC IDC SET ZONE DETECTION INTERVAL: 340 ms
MDC IDC SET ZONE DETECTION INTERVAL: 450 ms
MDC IDC STAT BRADY RV PERCENT PACED: 0.01 %
Zone Setting Detection Interval: 280 ms

## 2015-02-06 ENCOUNTER — Encounter: Payer: Self-pay | Admitting: Cardiology

## 2015-02-10 ENCOUNTER — Encounter: Payer: Self-pay | Admitting: Internal Medicine

## 2015-04-09 ENCOUNTER — Ambulatory Visit (INDEPENDENT_AMBULATORY_CARE_PROVIDER_SITE_OTHER): Payer: Medicare Other | Admitting: *Deleted

## 2015-04-09 DIAGNOSIS — I255 Ischemic cardiomyopathy: Secondary | ICD-10-CM

## 2015-04-09 DIAGNOSIS — I5022 Chronic systolic (congestive) heart failure: Secondary | ICD-10-CM | POA: Diagnosis not present

## 2015-04-09 NOTE — Progress Notes (Signed)
Remote ICD transmission.   

## 2015-04-25 ENCOUNTER — Other Ambulatory Visit: Payer: Self-pay | Admitting: Cardiovascular Disease

## 2015-04-25 LAB — CUP PACEART REMOTE DEVICE CHECK
Battery Voltage: 2.94 V
Brady Statistic RV Percent Paced: 0 %
Date Time Interrogation Session: 20160721114231
HighPow Impedance: 76 Ohm
Lead Channel Impedance Value: 741 Ohm
Lead Channel Sensing Intrinsic Amplitude: 14.625 mV
Lead Channel Setting Sensing Sensitivity: 0.3 mV
MDC IDC MSMT LEADCHNL RV SENSING INTR AMPL: 14.625 mV
MDC IDC SET LEADCHNL RV PACING AMPLITUDE: 2.5 V
MDC IDC SET LEADCHNL RV PACING PULSEWIDTH: 0.4 ms
MDC IDC SET ZONE DETECTION INTERVAL: 280 ms
Zone Setting Detection Interval: 340 ms
Zone Setting Detection Interval: 450 ms

## 2015-05-06 ENCOUNTER — Encounter: Payer: Self-pay | Admitting: Cardiology

## 2015-05-08 ENCOUNTER — Encounter: Payer: Self-pay | Admitting: Internal Medicine

## 2015-05-28 ENCOUNTER — Other Ambulatory Visit: Payer: Self-pay | Admitting: Cardiovascular Disease

## 2015-07-07 ENCOUNTER — Other Ambulatory Visit (INDEPENDENT_AMBULATORY_CARE_PROVIDER_SITE_OTHER): Payer: Medicare Other

## 2015-07-07 DIAGNOSIS — I471 Supraventricular tachycardia: Secondary | ICD-10-CM

## 2015-07-07 LAB — BASIC METABOLIC PANEL
BUN: 18 mg/dL (ref 7–25)
CALCIUM: 9.5 mg/dL (ref 8.6–10.4)
CHLORIDE: 108 mmol/L (ref 98–110)
CO2: 21 mmol/L (ref 20–31)
CREATININE: 1.24 mg/dL — AB (ref 0.50–1.05)
GLUCOSE: 97 mg/dL (ref 65–99)
Potassium: 4.8 mmol/L (ref 3.5–5.3)
Sodium: 137 mmol/L (ref 135–146)

## 2015-07-07 NOTE — Addendum Note (Signed)
Addended by: Velna Ochs on: 07/07/2015 08:49 AM   Modules accepted: Orders

## 2015-07-13 ENCOUNTER — Ambulatory Visit (INDEPENDENT_AMBULATORY_CARE_PROVIDER_SITE_OTHER): Payer: Medicare Other | Admitting: *Deleted

## 2015-07-13 DIAGNOSIS — I5022 Chronic systolic (congestive) heart failure: Secondary | ICD-10-CM | POA: Diagnosis not present

## 2015-07-13 DIAGNOSIS — I255 Ischemic cardiomyopathy: Secondary | ICD-10-CM | POA: Diagnosis not present

## 2015-07-14 NOTE — Progress Notes (Signed)
LOOP RECORDER  

## 2015-07-15 NOTE — Progress Notes (Signed)
Error below --- Remote ICD transmission.   

## 2015-07-17 ENCOUNTER — Encounter: Payer: Self-pay | Admitting: Cardiology

## 2015-07-17 LAB — CUP PACEART REMOTE DEVICE CHECK
Date Time Interrogation Session: 20161024112209
HIGH POWER IMPEDANCE MEASURED VALUE: 69 Ohm
Implantable Lead Implant Date: 20110110
Implantable Lead Location: 753860
Implantable Lead Model: 6935
Lead Channel Setting Pacing Pulse Width: 0.4 ms
Lead Channel Setting Sensing Sensitivity: 0.3 mV
MDC IDC MSMT BATTERY VOLTAGE: 2.86 V
MDC IDC MSMT LEADCHNL RV IMPEDANCE VALUE: 684 Ohm
MDC IDC MSMT LEADCHNL RV SENSING INTR AMPL: 15.25 mV
MDC IDC MSMT LEADCHNL RV SENSING INTR AMPL: 15.25 mV
MDC IDC SET LEADCHNL RV PACING AMPLITUDE: 2.5 V
MDC IDC STAT BRADY RV PERCENT PACED: 0 %

## 2015-10-01 ENCOUNTER — Telehealth: Payer: Self-pay | Admitting: Internal Medicine

## 2015-10-01 NOTE — Telephone Encounter (Signed)
Reviewed episode with Dr. Caryl Comes- wishes to reprogram device. Patient agreeable to come 10/05/15 at 1015. [name] [name] to schedule.

## 2015-10-01 NOTE — Telephone Encounter (Signed)
New message       1. Has your device fired?  Yes---it fired at 10am  2. Is you device beeping? no  3. Are you experiencing draining or swelling at device site? no 4. Are you calling to see if we received your device transmission? no  5. Have you passed out? No Pt sent in a transmission for [place] to look at

## 2015-10-01 NOTE — Telephone Encounter (Signed)
Returning patient's call. Transmission reviewed- shock x 1 this morning. Patient says she was brushing her teeth at the time of the episode and says she felt slightly dizzy before the shock. She reports that her BP afterwards was 102/63 and her HR was 82bpm. She denies any symptoms at this time and confirms that she is taking Coreg 25mg  BID as directed. Will review episodes with Dr. Caryl Comes this afternoon and call her back with any recommendations. Pt agreeable.

## 2015-10-02 NOTE — Progress Notes (Signed)
Patient Care Team: Redmond School, MD as PCP - General (Internal Medicine)   HPI  [name] [name] is a 60 y.o. female  seen in followup for an ICD implanted for primary prevention OCT 2011 . She is a 60 year-old woman s/p anterior MI 2010, who underwent primary PCI at that time. She had persisent severe LV dysfunction   Most recent echo 2/14 demonstrated EF 35-40%   She is seen today because of an ICD discharge last week that occurred while she was brushing her teeth. Wavelet failed to withhold therapy.  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   Appropriate Therapy no Inappropriate Therapy yes   The patient denies chest pain, shortness of breath, nocturnal dyspnea, orthopnea or peripheral edema. None further since diuretic There have been no palpitations, lightheadedness or syncope.   Her voice sounds much better but she has resumed smoking   Past Medical History  Diagnosis Date  . Ischemic cardiomyopathy     post-MI LVEF less than 30%; acute AMI Rx w BMS>>LAD  . ICD (implantable cardiac defibrillator), single MDT     treated with BMS to LAD  . Dyslipidemia   . GERD (gastroesophageal reflux disease)   . Depression   . Cervical disc disease     Past Surgical History  Procedure Laterality Date  . Cervical disc surgery    . Partial right masectomy    . Coronary angioplasty with stent placement    . Icd implanted      Current Outpatient Prescriptions  Medication Sig Dispense Refill  . acetaminophen (TYLENOL) 325 MG tablet Take 650 mg by mouth every 6 (six) hours as needed.      Marland Kitchen aspirin EC 81 MG EC tablet Take 1 tablet (81 mg total) by mouth daily. 30 tablet 6  . carvedilol (COREG) 25 MG tablet TAKE 1 TABLET BY MOUTH TWICE DAILY 180 tablet 3  . CRESTOR 40 MG tablet TAKE 1 TABLET BY MOUTH EVERY EVENING 30 tablet 11  . DULoxetine (CYMBALTA) 60 MG capsule Take 60 mg by mouth daily.   3    . furosemide (LASIX) 20 MG tablet TAKE 1 TABLET BY MOUTH EVERY OTHER DAY AS NEEDED FOR SWELLING 15 tablet 5  . losartan (COZAAR) 50 MG tablet TAKE 1 TABLET BY MOUTH EVERY DAY 90 tablet 3  . nitroGLYCERIN (NITROSTAT) 0.4 MG SL tablet Place 0.4 mg under the tongue every 5 (five) minutes as needed for chest pain. (up to 3 doses)    . pantoprazole (PROTONIX) 40 MG tablet TAKE 1 TABLET BY MOUTH EVERY DAY 30 tablet 11  . potassium chloride SA (K-DUR,KLOR-CON) 20 MEQ tablet Take 2 tablets (40 mEq total) by mouth daily. 180 tablet 3   No current facility-administered medications for this visit.    No Known Allergies  Review of Systems negative except from HPI and PMH  Physical Exam BP 86/58 mmHg  Pulse 58  Ht 5\' 4"  (1.626 m)  Wt 154 lb 9.6 oz (70.126 kg)  BMI 26.52 kg/m2 Well developed and well nourished in no acute distress HENT normal E scleral and icterus clear Neck Supple JVP flat; carotids brisk and full Clear to ausculation Device pocket well healed; without hematoma or erythema.  There is no tethering  Regular rate and rhythm, no murmurs gallops or rub Soft with active bowel sounds No clubbing cyanosis no Edema Alert and oriented, grossly normal motor and sensory function Skin  Warm and Dry   ECG demonstrates sinus rhythm at 58 Intervals 18/08/39   Assessment and  Plan  Ischemic Cardiomyopathy  Stable on current meds  CHF systolic chronic  euvolemic  Blood work drawn today, she takes lots of K  VT No intercurrent Ventricular tachycardia  SVT  Hypotension  ICD Medtronic  The patient's device was interrogated.  The information wasreviewed. No changes were made in the programming  Her blood pressure is low. Albeit without significant symptoms, we will decrease her carvedilol from 25--12.5  Device interrogation suggested that the tachycardia for which she received ATP and ICD shock was in fact SVT. This being the case, we have reprogrammed the device to an activated  shock therapy below VF 214 have made her  ATP more aggressive.  Encourage to stop smoking

## 2015-10-05 ENCOUNTER — Encounter: Payer: Self-pay | Admitting: Internal Medicine

## 2015-10-05 ENCOUNTER — Ambulatory Visit (INDEPENDENT_AMBULATORY_CARE_PROVIDER_SITE_OTHER): Payer: Medicare Other | Admitting: Internal Medicine

## 2015-10-05 VITALS — BP 86/58 | HR 58 | Ht 64.0 in | Wt 154.6 lb

## 2015-10-05 DIAGNOSIS — I5022 Chronic systolic (congestive) heart failure: Secondary | ICD-10-CM | POA: Diagnosis not present

## 2015-10-05 DIAGNOSIS — I255 Ischemic cardiomyopathy: Secondary | ICD-10-CM | POA: Diagnosis not present

## 2015-10-05 DIAGNOSIS — Z4502 Encounter for adjustment and management of automatic implantable cardiac defibrillator: Secondary | ICD-10-CM | POA: Diagnosis not present

## 2015-10-05 DIAGNOSIS — I4729 Other ventricular tachycardia: Secondary | ICD-10-CM

## 2015-10-05 DIAGNOSIS — I472 Ventricular tachycardia: Secondary | ICD-10-CM

## 2015-10-05 LAB — CUP PACEART INCLINIC DEVICE CHECK
Battery Voltage: 2.84 V
Brady Statistic RV Percent Paced: 0 %
Date Time Interrogation Session: 20170116170426
HIGH POWER IMPEDANCE MEASURED VALUE: 66 Ohm
Implantable Lead Implant Date: 20110110
Implantable Lead Location: 753860
Lead Channel Pacing Threshold Amplitude: 1 V
Lead Channel Setting Pacing Amplitude: 2.5 V
Lead Channel Setting Pacing Pulse Width: 0.4 ms
Lead Channel Setting Sensing Sensitivity: 0.3 mV
MDC IDC LEAD MODEL: 6935
MDC IDC MSMT LEADCHNL RV IMPEDANCE VALUE: 703 Ohm
MDC IDC MSMT LEADCHNL RV PACING THRESHOLD PULSEWIDTH: 0.4 ms
MDC IDC MSMT LEADCHNL RV SENSING INTR AMPL: 16.125 mV

## 2015-10-05 NOTE — Patient Instructions (Signed)
Medication Instructions: 1) Decrease Coreg (carvedilol) to 12.5 mg by mouth twice daily  Labwork: - none  Procedures/Testing: - none  Follow-Up: - Remote monitoring is used to monitor your Pacemaker of ICD from home. This monitoring reduces the number of office visits required to check your device to one time per year. It allows [place] to keep an eye on the functioning of your device to ensure it is working properly. You are scheduled for a device check from home on 01/04/16. You may send your transmission at any time that day. If you have a wireless device, the transmission will be sent automatically. After your physician reviews your transmission, you will receive a postcard with your next transmission date.  - Your physician wants you to follow-up in: 6 months with Chanetta Marshall, NP for Dr. Caryl Comes. You will receive a reminder letter in the mail two months in advance. If you don't receive a letter, please call our office to schedule the follow-up appointment.  Any Additional Special Instructions Will Be Listed Below (If Applicable).

## 2015-10-07 ENCOUNTER — Encounter: Payer: Self-pay | Admitting: Internal Medicine

## 2015-10-15 ENCOUNTER — Encounter: Payer: Medicare Other | Admitting: Internal Medicine

## 2015-11-26 ENCOUNTER — Other Ambulatory Visit: Payer: Self-pay | Admitting: Cardiovascular Disease

## 2015-12-24 ENCOUNTER — Other Ambulatory Visit: Payer: Self-pay | Admitting: Cardiovascular Disease

## 2015-12-28 ENCOUNTER — Other Ambulatory Visit: Payer: Self-pay

## 2015-12-28 MED ORDER — PANTOPRAZOLE SODIUM 40 MG PO TBEC: 40.0000 mg | DELAYED_RELEASE_TABLET | Freq: Every day | ORAL | Status: DC

## 2016-01-04 ENCOUNTER — Ambulatory Visit (INDEPENDENT_AMBULATORY_CARE_PROVIDER_SITE_OTHER): Payer: Medicare Other | Admitting: *Deleted

## 2016-01-04 DIAGNOSIS — I255 Ischemic cardiomyopathy: Secondary | ICD-10-CM | POA: Diagnosis not present

## 2016-01-04 NOTE — Progress Notes (Signed)
Remote ICD transmission.   

## 2016-01-08 ENCOUNTER — Other Ambulatory Visit: Payer: Self-pay

## 2016-01-08 MED ORDER — ROSUVASTATIN CALCIUM 40 MG PO TABS: 40.0000 mg | ORAL_TABLET | Freq: Every evening | ORAL | Status: DC

## 2016-02-11 ENCOUNTER — Encounter: Payer: Self-pay | Admitting: Cardiology

## 2016-02-11 LAB — CUP PACEART REMOTE DEVICE CHECK
Brady Statistic RV Percent Paced: 0 %
HighPow Impedance: 74 Ohm
Implantable Lead Location: 753860
Implantable Lead Model: 6935
Lead Channel Impedance Value: 703 Ohm
Lead Channel Sensing Intrinsic Amplitude: 14.125 mV
MDC IDC LEAD IMPLANT DT: 20110110
MDC IDC MSMT BATTERY VOLTAGE: 2.8 V
MDC IDC MSMT LEADCHNL RV SENSING INTR AMPL: 14.125 mV
MDC IDC SESS DTM: 20170417092611
MDC IDC SET LEADCHNL RV PACING AMPLITUDE: 2.5 V
MDC IDC SET LEADCHNL RV PACING PULSEWIDTH: 0.4 ms
MDC IDC SET LEADCHNL RV SENSING SENSITIVITY: 0.3 mV

## 2016-02-23 ENCOUNTER — Other Ambulatory Visit: Payer: Self-pay | Admitting: Cardiovascular Disease

## 2016-02-23 ENCOUNTER — Telehealth: Payer: Self-pay | Admitting: Cardiovascular Disease

## 2016-02-23 DIAGNOSIS — I255 Ischemic cardiomyopathy: Secondary | ICD-10-CM

## 2016-02-23 DIAGNOSIS — E785 Hyperlipidemia, unspecified: Secondary | ICD-10-CM

## 2016-02-23 NOTE — Telephone Encounter (Signed)
Orders placed for fasting lab work and lab appointment scheduled. I left the pt a voicemail in regards to lab appointment.

## 2016-02-23 NOTE — Telephone Encounter (Signed)
NewMessage  Pt stated she normally has lab work before visits w/ Dr Burt Knack- pt has OV sched for 6/9- wanted to come in tomorrow 6/7 for labs. No order in syst. Please call back and discuss.

## 2016-02-24 ENCOUNTER — Other Ambulatory Visit (INDEPENDENT_AMBULATORY_CARE_PROVIDER_SITE_OTHER): Payer: Medicare Other | Admitting: *Deleted

## 2016-02-24 DIAGNOSIS — I255 Ischemic cardiomyopathy: Secondary | ICD-10-CM

## 2016-02-24 DIAGNOSIS — E785 Hyperlipidemia, unspecified: Secondary | ICD-10-CM

## 2016-02-24 LAB — COMPREHENSIVE METABOLIC PANEL
ALBUMIN: 4.2 g/dL (ref 3.6–5.1)
ALT: 11 U/L (ref 6–29)
AST: 16 U/L (ref 10–35)
Alkaline Phosphatase: 115 U/L (ref 33–130)
BILIRUBIN TOTAL: 0.4 mg/dL (ref 0.2–1.2)
BUN: 10 mg/dL (ref 7–25)
CALCIUM: 9 mg/dL (ref 8.6–10.4)
CHLORIDE: 104 mmol/L (ref 98–110)
CO2: 19 mmol/L — AB (ref 20–31)
Creat: 1.15 mg/dL — ABNORMAL HIGH (ref 0.50–1.05)
GLUCOSE: 85 mg/dL (ref 65–99)
Potassium: 3.7 mmol/L (ref 3.5–5.3)
SODIUM: 136 mmol/L (ref 135–146)
Total Protein: 6.6 g/dL (ref 6.1–8.1)

## 2016-02-24 LAB — LIPID PANEL
CHOL/HDL RATIO: 3.3 ratio (ref <=5.0)
CHOLESTEROL: 114 mg/dL — AB (ref 125–200)
HDL: 35 mg/dL — AB (ref >=46)
LDL Cholesterol: 50 mg/dL (ref <130)
Triglycerides: 146 mg/dL (ref <150)
VLDL: 29 mg/dL (ref <30)

## 2016-02-24 NOTE — Addendum Note (Signed)
Addended by: Eulis Foster on: 02/24/2016 10:41 AM   Modules accepted: Orders

## 2016-02-26 ENCOUNTER — Encounter: Payer: Self-pay | Admitting: Cardiovascular Disease

## 2016-02-26 ENCOUNTER — Ambulatory Visit (INDEPENDENT_AMBULATORY_CARE_PROVIDER_SITE_OTHER): Payer: Medicare Other | Admitting: Cardiovascular Disease

## 2016-02-26 VITALS — BP 102/60 | HR 62 | Ht 64.0 in | Wt 146.0 lb

## 2016-02-26 DIAGNOSIS — I255 Ischemic cardiomyopathy: Secondary | ICD-10-CM | POA: Diagnosis not present

## 2016-02-26 DIAGNOSIS — I2510 Atherosclerotic heart disease of native coronary artery without angina pectoris: Secondary | ICD-10-CM

## 2016-02-26 NOTE — Progress Notes (Signed)
Cardiology Office Note Date:  02/26/2016   ID:  [name] [name], DOB 1955-11-25, MRN [id number]  PCP:  Glo Herring., MD  Cardiologist:  Sherren Mocha, MD    Chief Complaint  Patient presents with  . Follow-up    CHF   History of Present Illness: [name] [name] is a 60 y.o. female who presents for follow-up of ischemic cardiomyopathy. The patient initially presented with an anterior wall MI in 2010 and was treated with stenting of her LAD. She had residual severe LV dysfunction despite optimal medical therapy. She underwent ICD implantation. She has been remarkably stable over the years without symptoms of angina or congestive heart failure.  She returns today for annual follow-up. She continues to feel well and has no symptoms of chest pain, shortness of breath, leg swelling, heart palpitations, lightheadedness, or weakness. She is concerned because she has been diagnosed with chronic kidney disease and she was referred to nephrology.  Past Medical History  Diagnosis Date  . Ischemic cardiomyopathy     post-MI LVEF less than 30%; acute AMI Rx w BMS>>LAD  . ICD (implantable cardiac defibrillator), single MDT     treated with BMS to LAD  . Dyslipidemia   . GERD (gastroesophageal reflux disease)   . Depression   . Cervical disc disease     Past Surgical History  Procedure Laterality Date  . Cervical disc surgery    . Partial right masectomy    . Coronary angioplasty with stent placement    . Icd implanted      Current Outpatient Prescriptions  Medication Sig Dispense Refill  . acetaminophen (TYLENOL) 325 MG tablet Take 650 mg by mouth every 6 (six) hours as needed.      Marland Kitchen aspirin EC 81 MG EC tablet Take 1 tablet (81 mg total) by mouth daily. 30 tablet 6  . carvedilol (COREG) 25 MG tablet Take 1/2 tablet (12.5 mg) by mouth twice daily    . DULoxetine (CYMBALTA) 60 MG capsule Take 60 mg by mouth daily.   3  . furosemide (LASIX) 20 MG tablet TAKE 1 TABLET BY MOUTH EVERY OTHER  DAY AS NEEDED FOR SWELLING 15 tablet 0  . losartan (COZAAR) 50 MG tablet TAKE 1 TABLET BY MOUTH EVERY DAY 90 tablet 3  . nitroGLYCERIN (NITROSTAT) 0.4 MG SL tablet Place 0.4 mg under the tongue every 5 (five) minutes as needed for chest pain. (up to 3 doses)    . pantoprazole (PROTONIX) 40 MG tablet Take 1 tablet (40 mg total) by mouth daily. 30 tablet 11  . potassium chloride SA (K-DUR,KLOR-CON) 20 MEQ tablet TAKE 2 TABLETS(40 MEQ) BY MOUTH DAILY 180 tablet 0  . rosuvastatin (CRESTOR) 40 MG tablet Take 1 tablet (40 mg total) by mouth every evening. 30 tablet 3   No current facility-administered medications for this visit.    Allergies:   Review of patient's allergies indicates no known allergies.   Social History:  The patient  reports that she has quit smoking. She does not have any smokeless tobacco history on file. She reports that she does not drink alcohol or use illicit drugs.   Family History:  The patient's family history includes Heart attack in her mother.    ROS:  Please see the history of present illness.    All other systems are reviewed and negative.   PHYSICAL EXAM: VS:  BP 102/60 mmHg  Pulse 62  Ht 5\' 4"  (1.626 m)  Wt 146 lb (66.225 kg)  BMI 25.05 kg/m2 , BMI Body mass index is 25.05 kg/(m^2). GEN: Well nourished, well developed, in no acute distress HEENT: normal Neck: no JVD, no masses. No carotid bruits Cardiac: RRR without murmur or gallop                Respiratory:  clear to auscultation bilaterally, normal work of breathing GI: soft, nontender, nondistended, + BS MS: no deformity or atrophy Ext: no pretibial edema, pedal pulses 2+= bilaterally Skin: warm and dry, no rash Neuro:  Strength and sensation are intact Psych: euthymic mood, full affect  EKG:  EKG is ordered today. The ekg ordered today shows NSR 65 bpm, age-indeterminate septal infarct. No change from previous tracing.  Recent Labs: 02/24/2016: ALT 11; BUN 10; Creat 1.15*; Potassium 3.7; Sodium  136   Lipid Panel     Component Value Date/Time   CHOL 114* 02/24/2016 1041   TRIG 146 02/24/2016 1041   HDL 35* 02/24/2016 1041   CHOLHDL 3.3 02/24/2016 1041   VLDL 29 02/24/2016 1041   LDLCALC 50 02/24/2016 1041   LDLDIRECT 70.8 10/27/2010 1020      Wt Readings from Last 3 Encounters:  02/26/16 146 lb (66.225 kg)  10/05/15 154 lb 9.6 oz (70.126 kg)  12/26/14 145 lb 12.8 oz (66.134 kg)    ASSESSMENT AND PLAN: 1.  CAD, native vessel: No angina. The patient is not limited by her cardiac disease. Her medications are reviewed and will be continued without changes.  2. Chronic systolic heart failure, NYHA functional class I: She is treated with carvedilol and losartan. Remains remarkably stable.  3. Status post ICD: Followed by Dr. Caryl Comes. No ICD discharges.  4. Hyperlipidemia: The patient continues with Crestor 40 mg daily. Her recent lipids are reviewed with an LDL of 50 mg/dL.  5. Chronic kidney disease stage III:  Her creatinine clearance is 56 mL/min. She is appropriately treated with losartan. She drinks plenty of fluids and now understands to avoid nephrotoxins. She does not take nonsteroidal anti-inflammatories and only uses Tylenol as needed. She has been referred to nephrology.  Current medicines are reviewed with the patient today.  The patient does not have concerns regarding medicines.  Labs/ tests ordered today include:   Orders Placed This Encounter  Procedures  . EKG 12-Lead    Disposition:   FU one year  Signed, Sherren Mocha, MD  02/26/2016 6:03 PM    [facility] [address], [place], [place]  [address] Phone: [phone]; Fax: [phone]

## 2016-02-26 NOTE — Patient Instructions (Signed)

## 2016-03-25 ENCOUNTER — Other Ambulatory Visit: Payer: Self-pay

## 2016-03-25 MED ORDER — CARVEDILOL 12.5 MG PO TABS: ORAL_TABLET | ORAL | Status: DC

## 2016-03-25 NOTE — Telephone Encounter (Signed)
AVS Reports     Date/Time Report Action User    10/05/2015 11:31 AM After Visit Summary Printed Emily Filbert, RN      Patient Instructions     Medication Instructions: 1) Decrease Coreg (carvedilol) to 12.5 mg by mouth twice daily   Dr. Caryl Comes decreased dosage. Current therapy for patient is 12.5 mg PO BID.

## 2016-03-29 ENCOUNTER — Other Ambulatory Visit: Payer: Self-pay | Admitting: Cardiovascular Disease

## 2016-05-01 ENCOUNTER — Other Ambulatory Visit: Payer: Self-pay | Admitting: Cardiovascular Disease

## 2016-05-11 ENCOUNTER — Other Ambulatory Visit: Payer: Self-pay | Admitting: Internal Medicine

## 2016-06-01 ENCOUNTER — Other Ambulatory Visit: Payer: Self-pay | Admitting: Cardiovascular Disease

## 2016-09-24 ENCOUNTER — Other Ambulatory Visit: Payer: Self-pay | Admitting: Cardiovascular Disease

## 2016-11-14 ENCOUNTER — Encounter: Payer: Self-pay | Admitting: Internal Medicine

## 2016-11-14 ENCOUNTER — Encounter (INDEPENDENT_AMBULATORY_CARE_PROVIDER_SITE_OTHER): Payer: Self-pay

## 2016-11-14 ENCOUNTER — Ambulatory Visit (INDEPENDENT_AMBULATORY_CARE_PROVIDER_SITE_OTHER): Payer: Medicare Other | Admitting: Internal Medicine

## 2016-11-14 VITALS — BP 110/60 | HR 69 | Ht 64.0 in | Wt 155.2 lb

## 2016-11-14 DIAGNOSIS — I472 Ventricular tachycardia, unspecified: Secondary | ICD-10-CM

## 2016-11-14 DIAGNOSIS — I255 Ischemic cardiomyopathy: Secondary | ICD-10-CM | POA: Diagnosis not present

## 2016-11-14 DIAGNOSIS — I5022 Chronic systolic (congestive) heart failure: Secondary | ICD-10-CM | POA: Diagnosis not present

## 2016-11-14 DIAGNOSIS — Z95810 Presence of automatic (implantable) cardiac defibrillator: Secondary | ICD-10-CM

## 2016-11-14 DIAGNOSIS — I471 Supraventricular tachycardia: Secondary | ICD-10-CM

## 2016-11-14 LAB — CUP PACEART INCLINIC DEVICE CHECK
HIGH POWER IMPEDANCE MEASURED VALUE: 74 Ohm
HighPow Impedance: 74 Ohm
Implantable Lead Implant Date: 20110110
Implantable Lead Location: 753860
Implantable Pulse Generator Implant Date: 20110110
Lead Channel Sensing Intrinsic Amplitude: 16.625 mV
Lead Channel Sensing Intrinsic Amplitude: 16.625 mV
Lead Channel Sensing Intrinsic Amplitude: 22.25 mV
Lead Channel Setting Pacing Amplitude: 2.5 V
Lead Channel Setting Pacing Pulse Width: 0.4 ms
MDC IDC MSMT BATTERY VOLTAGE: 2.65 V
MDC IDC MSMT LEADCHNL RV IMPEDANCE VALUE: 646 Ohm
MDC IDC MSMT LEADCHNL RV IMPEDANCE VALUE: 646 Ohm
MDC IDC MSMT LEADCHNL RV SENSING INTR AMPL: 22.25 mV
MDC IDC SESS DTM: 20180226144216
MDC IDC SET LEADCHNL RV SENSING SENSITIVITY: 0.3 mV
MDC IDC STAT BRADY RV PERCENT PACED: 0 %

## 2016-11-14 LAB — BASIC METABOLIC PANEL
BUN/Creatinine Ratio: 12 (ref 9–23)
BUN: 15 mg/dL (ref 6–24)
CO2: 20 mmol/L (ref 18–29)
Calcium: 9.2 mg/dL (ref 8.7–10.2)
Chloride: 100 mmol/L (ref 96–106)
Creatinine, Ser: 1.27 mg/dL — ABNORMAL HIGH (ref 0.57–1.00)
GFR calc Af Amer: 54 mL/min/{1.73_m2} — ABNORMAL LOW (ref >59)
GFR calc non Af Amer: 47 mL/min/{1.73_m2} — ABNORMAL LOW (ref >59)
GLUCOSE: 92 mg/dL (ref 65–99)
POTASSIUM: 4.3 mmol/L (ref 3.5–5.2)
SODIUM: 139 mmol/L (ref 134–144)

## 2016-11-14 NOTE — Progress Notes (Signed)
Patient Care Team: Redmond School, MD as PCP - General (Internal Medicine)   HPI  [name] [name] is a 61 y.o. female  seen in followup for an ICD implanted for primary prevention OCT 2011 . She is a 61 year-old woman s/p anterior MI 2010, who underwent primary PCI at that time. She had persisent severe LV dysfunction   Most recent echo 2/14 demonstrated EF 35-40%   She is seen today because of an ICD discharge last week that occurred while she was brushing her teeth. Wavelet failed to withhold therapy.  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   Appropriate Therapy no Inappropriate Therapy yes   The patient denies chest pain, shortness of breath, nocturnal dyspnea, orthopnea or peripheral edema. None further since diuretic There have been no palpitations, lightheadedness or syncope.   Her voice sounds much better but she has resumed smoking   Past Medical History:  Diagnosis Date  . Cervical disc disease   . Depression   . Dyslipidemia   . GERD (gastroesophageal reflux disease)   . ICD (implantable cardiac defibrillator), single MDT    treated with BMS to LAD  . Ischemic cardiomyopathy    post-MI LVEF less than 30%; acute AMI Rx w BMS>>LAD    Past Surgical History:  Procedure Laterality Date  . CERVICAL DISC SURGERY    . CORONARY ANGIOPLASTY WITH STENT PLACEMENT    . icd implanted    . partial right masectomy      Current Outpatient Prescriptions  Medication Sig Dispense Refill  . acetaminophen (TYLENOL) 325 MG tablet Take 650 mg by mouth every 6 (six) hours as needed.      Marland Kitchen aspirin EC 81 MG EC tablet Take 1 tablet (81 mg total) by mouth daily. 30 tablet 6  . carvedilol (COREG) 12.5 MG tablet Take 1 tablet (12.5 mg) by mouth twice daily 180 tablet 3  . DULoxetine (CYMBALTA) 60 MG capsule Take 60 mg by mouth daily.   3  . furosemide (LASIX) 20 MG tablet TAKE 1 TABLET BY MOUTH EVERY  OTHER DAY AS NEEDED FOR SWELLING 15 tablet 8  . losartan (COZAAR) 50 MG tablet TAKE 1 TABLET BY MOUTH EVERY DAY 90 tablet 2  . nitroGLYCERIN (NITROSTAT) 0.4 MG SL tablet Place 0.4 mg under the tongue every 5 (five) minutes as needed for chest pain. (up to 3 doses)    . pantoprazole (PROTONIX) 40 MG tablet Take 1 tablet (40 mg total) by mouth daily. 30 tablet 11  . potassium chloride SA (K-DUR,KLOR-CON) 20 MEQ tablet TAKE 2 TABLETS BY MOUTH DAILY 180 tablet 1  . rosuvastatin (CRESTOR) 40 MG tablet TAKE 1 TABLET(40 MG) BY MOUTH EVERY EVENING 30 tablet 11   No current facility-administered medications for this visit.     No Known Allergies  Review of Systems negative except from HPI and PMH  Physical Exam There were no vitals taken for this visit. Well developed and well nourished in no acute distress HENT normal E scleral and icterus clear Neck Supple JVP flat; carotids brisk and full Clear to ausculation Device pocket well healed; without hematoma or erythema.  There is no tethering  Regular rate and rhythm, no murmurs gallops or rub Soft with active bowel sounds No clubbing cyanosis no Edema Alert and oriented, grossly normal motor and sensory function Skin Warm and Dry   ECG demonstrates sinus rhythm at 66 Intervals 16/09/39  ASMI  Assessment and  Plan  Ischemic Cardiomyopathy  Stable on current meds  CHF systolic chronic  euvolemic  Blood work drawn today, she takes lots of K  VT  SVT  Hypotension  ICD Medtronic  The patient's device was interrogated.  The information was reviewed. No changes were made in the programming   No intercurrent  Ventricular tachycardia  She continues to have episodes of tachycardia. By far field imaging a look to be VT. They're variably defined by way flat. By  nearfield imaging a look like they are SVT. With her antecedent history of frog positive long-standing abrupt onset offset tachypalpitations I suspect that they are in fact SVT.  Given the evidence of proarrhythmia noted with polymorphic ventricular tachycardia and the fact that she has had what we thought was inappropriate shocks, I would favor EP testing and possible ablation of the substrate.   she will think about it and and we will call her in a few weeks.  Her blood pressure is  low  Without symptoms  Without symptoms of ischemia   no longer smoking    More than 50% of 45 min was spent in counseling related to the above

## 2016-11-14 NOTE — Patient Instructions (Addendum)
Medication Instructions: - Your physician recommends that you continue on your current medications as directed. Please refer to the Current Medication list given to you today.  Labwork: - Your physician recommends that you have lab work today: BMP  Procedures/Testing: Nira Conn, Dr. Olin Pia nurse will call you in about 2-3 weeks to follow up regarding having an EP study.  Follow-Up: - Remote monitoring is used to monitor your Pacemaker of ICD from home. This monitoring reduces the number of office visits required to check your device to one time per year. It allows [place] to keep an eye on the functioning of your device to ensure it is working properly. You are scheduled for a device check from home on 02/14/17. You may send your transmission at any time that day. If you have a wireless device, the transmission will be sent automatically. After your physician reviews your transmission, you will receive a postcard with your next transmission date.  Your physician wants you to follow-up in: 1 year with Dr. Caryl Comes (if no EP study done). You will receive a reminder letter in the mail two months in advance. If you don't receive a letter, please call our office to schedule the follow-up appointment.  Any Additional Special Instructions Will Be Listed Below (If Applicable).     If you need a refill on your cardiac medications before your next appointment, please call your pharmacy.

## 2016-12-24 ENCOUNTER — Other Ambulatory Visit: Payer: Self-pay | Admitting: Internal Medicine

## 2017-01-24 ENCOUNTER — Other Ambulatory Visit: Payer: Self-pay | Admitting: Internal Medicine

## 2017-02-02 ENCOUNTER — Other Ambulatory Visit: Payer: Self-pay | Admitting: Cardiovascular Disease

## 2017-02-14 ENCOUNTER — Ambulatory Visit (INDEPENDENT_AMBULATORY_CARE_PROVIDER_SITE_OTHER): Payer: Medicare Other | Admitting: *Deleted

## 2017-02-14 DIAGNOSIS — I255 Ischemic cardiomyopathy: Secondary | ICD-10-CM

## 2017-02-14 NOTE — Progress Notes (Signed)
Remote ICD transmission.   

## 2017-02-15 LAB — CUP PACEART REMOTE DEVICE CHECK
Brady Statistic RV Percent Paced: 0 %
Date Time Interrogation Session: 20180529052209
HighPow Impedance: 82 Ohm
Implantable Lead Implant Date: 20110110
Implantable Lead Location: 753860
Implantable Lead Model: 6935
Lead Channel Impedance Value: 627 Ohm
Lead Channel Setting Pacing Amplitude: 2.5 V
Lead Channel Setting Sensing Sensitivity: 0.3 mV
MDC IDC MSMT BATTERY VOLTAGE: 2.62 V
MDC IDC MSMT LEADCHNL RV SENSING INTR AMPL: 16.75 mV
MDC IDC MSMT LEADCHNL RV SENSING INTR AMPL: 16.75 mV
MDC IDC PG IMPLANT DT: 20110110
MDC IDC SET LEADCHNL RV PACING PULSEWIDTH: 0.4 ms

## 2017-02-17 ENCOUNTER — Encounter: Payer: Self-pay | Admitting: Cardiology

## 2017-02-23 ENCOUNTER — Other Ambulatory Visit: Payer: Self-pay | Admitting: Cardiovascular Disease

## 2017-03-12 ENCOUNTER — Other Ambulatory Visit: Payer: Self-pay | Admitting: Internal Medicine

## 2017-03-13 ENCOUNTER — Ambulatory Visit (INDEPENDENT_AMBULATORY_CARE_PROVIDER_SITE_OTHER): Payer: Self-pay | Admitting: *Deleted

## 2017-03-13 DIAGNOSIS — Z95810 Presence of automatic (implantable) cardiac defibrillator: Secondary | ICD-10-CM

## 2017-03-13 NOTE — Progress Notes (Signed)
Remote ICD transmission.   

## 2017-03-13 NOTE — Addendum Note (Signed)
Addended by: Tiajuana Amass on: 03/13/2017 12:21 PM   Modules accepted: Level of Service

## 2017-03-15 ENCOUNTER — Encounter: Payer: Self-pay | Admitting: Cardiology

## 2017-03-15 LAB — CUP PACEART REMOTE DEVICE CHECK
Brady Statistic RV Percent Paced: 0 %
HighPow Impedance: 72 Ohm
Implantable Lead Implant Date: 20110110
Implantable Lead Location: 753860
Implantable Lead Model: 6935
Implantable Pulse Generator Implant Date: 20110110
Lead Channel Sensing Intrinsic Amplitude: 16.375 mV
Lead Channel Setting Sensing Sensitivity: 0.3 mV
MDC IDC MSMT BATTERY VOLTAGE: 2.62 V
MDC IDC MSMT LEADCHNL RV IMPEDANCE VALUE: 551 Ohm
MDC IDC MSMT LEADCHNL RV SENSING INTR AMPL: 16.375 mV
MDC IDC SESS DTM: 20180625084226
MDC IDC SET LEADCHNL RV PACING AMPLITUDE: 2.5 V
MDC IDC SET LEADCHNL RV PACING PULSEWIDTH: 0.4 ms

## 2017-03-27 ENCOUNTER — Other Ambulatory Visit: Payer: Self-pay | Admitting: Cardiovascular Disease

## 2017-03-29 ENCOUNTER — Encounter: Payer: Self-pay | Admitting: Cardiovascular Disease

## 2017-03-29 ENCOUNTER — Ambulatory Visit (INDEPENDENT_AMBULATORY_CARE_PROVIDER_SITE_OTHER): Payer: Medicare Other | Admitting: Cardiovascular Disease

## 2017-03-29 ENCOUNTER — Encounter (INDEPENDENT_AMBULATORY_CARE_PROVIDER_SITE_OTHER): Payer: Self-pay

## 2017-03-29 VITALS — BP 102/62 | HR 72 | Ht 64.0 in | Wt 156.0 lb

## 2017-03-29 DIAGNOSIS — I255 Ischemic cardiomyopathy: Secondary | ICD-10-CM

## 2017-03-29 DIAGNOSIS — I5022 Chronic systolic (congestive) heart failure: Secondary | ICD-10-CM | POA: Diagnosis not present

## 2017-03-29 NOTE — Patient Instructions (Signed)
Medication Instructions:   Your physician recommends that you continue on your current medications as directed. Please refer to the Current Medication list given to you today.   If you need a refill on your cardiac medications before your next appointment, please call your pharmacy.  Labwork: NONE ORDERED  TODAY    Testing/Procedures:NONE ORDERED  TODAY    Follow-Up: Your physician wants you to follow-up in: [place]  DR. Emelda Fear will receive a reminder letter in the mail two months in advance. If you don't receive a letter, please call our office to schedule the follow-up appointment.     Any Other Special Instructions Will Be Listed Below (If Applicable).

## 2017-03-29 NOTE — Progress Notes (Signed)
Cardiology Office Note Date:  03/29/2017   ID:  EVOLETH NORDMEYER, DOB 11/22/1955, MRN [id number]  PCP:  Redmond School, MD  Cardiologist:  Sherren Mocha, MD    Chief Complaint  Patient presents with  . Follow-up    Cardiomyopathy   History of Present Illness: [name] [name] is a 61 y.o. female who presents for  follow-up of ischemic cardiomyopathy. The patient initially presented with an anterior wall MI in 2010 and was treated with stenting of her LAD. She had residual severe LV dysfunction despite optimal medical therapy. She underwent ICD implantation. She has been remarkably stable over the years without symptoms of angina or congestive heart failure.  The patient is here alone today. Since she was seen last year she experienced 3 ICD discharges. She was evaluated by Dr. Caryl Comes and some adjustments were made in her device. She's had no further problems over the last several months. Today, she denies symptoms of palpitations, chest pain, shortness of breath, orthopnea, PND, lower extremity edema, dizziness, or syncope.   Past Medical History:  Diagnosis Date  . Cervical disc disease   . Depression   . Dyslipidemia   . GERD (gastroesophageal reflux disease)   . ICD (implantable cardiac defibrillator), single MDT    treated with BMS to LAD  . Ischemic cardiomyopathy    post-MI LVEF less than 30%; acute AMI Rx w BMS>>LAD    Past Surgical History:  Procedure Laterality Date  . CERVICAL DISC SURGERY    . CORONARY ANGIOPLASTY WITH STENT PLACEMENT    . icd implanted    . partial right masectomy      Current Outpatient Prescriptions  Medication Sig Dispense Refill  . acetaminophen (TYLENOL) 325 MG tablet Take 650 mg by mouth every 6 (six) hours as needed.      Marland Kitchen aspirin EC 81 MG EC tablet Take 1 tablet (81 mg total) by mouth daily. 30 tablet 6  . carvedilol (COREG) 12.5 MG tablet TAKE 1 TABLET(12.5 MG) BY MOUTH TWICE DAILY 180 tablet 2  . DULoxetine (CYMBALTA) 60 MG capsule Take  60 mg by mouth daily.   3  . furosemide (LASIX) 20 MG tablet TAKE 1 TABLET BY MOUTH EVERY OTHER DAY AS NEEDED FOR SWELLING 45 tablet 0  . losartan (COZAAR) 50 MG tablet TAKE 1 TABLET BY MOUTH EVERY DAY 90 tablet 0  . nitroGLYCERIN (NITROSTAT) 0.4 MG SL tablet Place 0.4 mg under the tongue every 5 (five) minutes as needed for chest pain. (up to 3 doses)    . pantoprazole (PROTONIX) 40 MG tablet Take 1 tablet (40 mg total) by mouth daily. 90 tablet 3  . potassium chloride SA (K-DUR,KLOR-CON) 20 MEQ tablet TAKE 2 TABLETS BY MOUTH DAILY 180 tablet 0  . rosuvastatin (CRESTOR) 40 MG tablet TAKE 1 TABLET(40 MG) BY MOUTH EVERY EVENING 30 tablet 11   No current facility-administered medications for this visit.     Allergies:   Patient has no known allergies.   Social History:  The patient  reports that she has quit smoking. She has a 5.00 pack-year smoking history. She has never used smokeless tobacco. She reports that she does not drink alcohol or use drugs.   Family History:  The patient's family history includes Heart attack in her mother.    ROS:  Please see the history of present illness.  Otherwise, review of systems is positive for anxiety, depression.  All other systems are reviewed and negative.    PHYSICAL EXAM: VS:  BP 102/62   Pulse 72   Ht 5\' 4"  (1.626 m)   Wt 156 lb (70.8 kg)   BMI 26.78 kg/m  , BMI Body mass index is 26.78 kg/m. GEN: Well nourished, well developed, in no acute distress  HEENT: normal  Neck: no JVD, no masses. No carotid bruits Cardiac: RRR without murmur or gallop                Respiratory:  clear to auscultation bilaterally, normal work of breathing GI: soft, nontender, nondistended, + BS MS: no deformity or atrophy  Ext: no pretibial edema, pedal pulses 2+= bilaterally Skin: warm and dry, no rash, tan skin Neuro:  Strength and sensation are intact Psych: euthymic mood, full affect  EKG:  EKG is ordered today. The ekg ordered today shows normal sinus  rhythm with age-indeterminate septal infarct  Recent Labs: 11/14/2016: BUN 15; Creatinine, Ser 1.27; Potassium 4.3; Sodium 139   Lipid Panel     Component Value Date/Time   CHOL 114 (L) 02/24/2016 1041   TRIG 146 02/24/2016 1041   HDL 35 (L) 02/24/2016 1041   CHOLHDL 3.3 02/24/2016 1041   VLDL 29 02/24/2016 1041   LDLCALC 50 02/24/2016 1041   LDLDIRECT 70.8 10/27/2010 1020      Wt Readings from Last 3 Encounters:  03/29/17 156 lb (70.8 kg)  11/14/16 155 lb 3.2 oz (70.4 kg)  02/26/16 146 lb (66.2 kg)    ASSESSMENT AND PLAN: 1.  Coronary artery disease, native vessel, without angina: The patient continues to do very well without symptoms. Her medications will be continued without change.  2. Chronic systolic heart failure with New York Heart Association functional class I symptoms. She will continue on carvedilol and losartan.  3. Ischemic cardiomyopathy status post ICD: Followed by Dr. Caryl Comes. Interval history of ICD discharges.  4. Hyperlipidemia: The patient is treated with Crestor 40 mg daily. Most recent lipids and LFTs reviewed.  5. Chronic kidney disease stage III: Most recent creatinines 1.27 mg/dL which is stable. She continues on losartan.  Current medicines are reviewed with the patient today.  The patient does not have concerns regarding medicines.  Labs/ tests ordered today include:   Orders Placed This Encounter  Procedures  . EKG 12-Lead    Disposition:   FU one year  Signed, Sherren Mocha, MD  03/29/2017 6:03 PM    [facility] [address], [place], [place]  [address] Phone: [phone]; Fax: [phone]

## 2017-04-13 ENCOUNTER — Ambulatory Visit (INDEPENDENT_AMBULATORY_CARE_PROVIDER_SITE_OTHER): Payer: Self-pay | Admitting: *Deleted

## 2017-04-13 DIAGNOSIS — Z95810 Presence of automatic (implantable) cardiac defibrillator: Secondary | ICD-10-CM

## 2017-04-13 NOTE — Progress Notes (Signed)
Remote ICD transmission.   

## 2017-04-14 ENCOUNTER — Encounter: Payer: Self-pay | Admitting: Cardiology

## 2017-05-01 ENCOUNTER — Other Ambulatory Visit: Payer: Self-pay | Admitting: Cardiovascular Disease

## 2017-05-03 ENCOUNTER — Other Ambulatory Visit: Payer: Self-pay | Admitting: Cardiovascular Disease

## 2017-05-10 LAB — CUP PACEART REMOTE DEVICE CHECK
Battery Voltage: 2.62 V
Brady Statistic RV Percent Paced: 0 %
HIGH POWER IMPEDANCE MEASURED VALUE: 80 Ohm
Implantable Lead Location: 753860
Lead Channel Impedance Value: 627 Ohm
Lead Channel Setting Pacing Amplitude: 2.5 V
Lead Channel Setting Pacing Pulse Width: 0.4 ms
Lead Channel Setting Sensing Sensitivity: 0.3 mV
MDC IDC LEAD IMPLANT DT: 20110110
MDC IDC MSMT LEADCHNL RV SENSING INTR AMPL: 15.875 mV
MDC IDC MSMT LEADCHNL RV SENSING INTR AMPL: 15.875 mV
MDC IDC PG IMPLANT DT: 20110110
MDC IDC SESS DTM: 20180726071608

## 2017-05-16 ENCOUNTER — Ambulatory Visit (INDEPENDENT_AMBULATORY_CARE_PROVIDER_SITE_OTHER): Payer: Medicare Other | Admitting: *Deleted

## 2017-05-16 DIAGNOSIS — I255 Ischemic cardiomyopathy: Secondary | ICD-10-CM

## 2017-05-16 NOTE — Progress Notes (Signed)
Remote ICD transmission.   

## 2017-05-17 LAB — CUP PACEART REMOTE DEVICE CHECK
HighPow Impedance: 81 Ohm
Implantable Lead Implant Date: 20110110
Implantable Pulse Generator Implant Date: 20110110
Lead Channel Impedance Value: 627 Ohm
Lead Channel Sensing Intrinsic Amplitude: 18.375 mV
Lead Channel Sensing Intrinsic Amplitude: 18.375 mV
Lead Channel Setting Pacing Pulse Width: 0.4 ms
MDC IDC LEAD LOCATION: 753860
MDC IDC MSMT BATTERY VOLTAGE: 2.62 V
MDC IDC SESS DTM: 20180828052208
MDC IDC SET LEADCHNL RV PACING AMPLITUDE: 2.5 V
MDC IDC SET LEADCHNL RV SENSING SENSITIVITY: 0.3 mV
MDC IDC STAT BRADY RV PERCENT PACED: 0 %

## 2017-05-26 ENCOUNTER — Encounter: Payer: Self-pay | Admitting: Cardiology

## 2017-05-27 ENCOUNTER — Other Ambulatory Visit: Payer: Self-pay | Admitting: Cardiovascular Disease

## 2017-06-23 ENCOUNTER — Other Ambulatory Visit: Payer: Self-pay | Admitting: Cardiovascular Disease

## 2017-08-15 ENCOUNTER — Ambulatory Visit (INDEPENDENT_AMBULATORY_CARE_PROVIDER_SITE_OTHER): Payer: Medicare Other | Admitting: *Deleted

## 2017-08-15 DIAGNOSIS — I255 Ischemic cardiomyopathy: Secondary | ICD-10-CM

## 2017-08-15 NOTE — Progress Notes (Signed)
Remote ICD transmission.   

## 2017-08-17 LAB — CUP PACEART REMOTE DEVICE CHECK
Date Time Interrogation Session: 20181127094227
HIGH POWER IMPEDANCE MEASURED VALUE: 77 Ohm
Lead Channel Impedance Value: 589 Ohm
Lead Channel Sensing Intrinsic Amplitude: 18.125 mV
Lead Channel Setting Pacing Pulse Width: 0.4 ms
Lead Channel Setting Sensing Sensitivity: 0.3 mV
MDC IDC LEAD IMPLANT DT: 20110110
MDC IDC LEAD LOCATION: 753860
MDC IDC MSMT BATTERY VOLTAGE: 2.62 V
MDC IDC MSMT LEADCHNL RV SENSING INTR AMPL: 18.125 mV
MDC IDC PG IMPLANT DT: 20110110
MDC IDC SET LEADCHNL RV PACING AMPLITUDE: 2.5 V
MDC IDC STAT BRADY RV PERCENT PACED: 0 %

## 2017-08-18 ENCOUNTER — Encounter: Payer: Self-pay | Admitting: Cardiology

## 2017-11-14 ENCOUNTER — Ambulatory Visit (INDEPENDENT_AMBULATORY_CARE_PROVIDER_SITE_OTHER): Payer: Medicare Other | Admitting: *Deleted

## 2017-11-14 ENCOUNTER — Telehealth: Payer: Self-pay | Admitting: *Deleted

## 2017-11-14 DIAGNOSIS — I255 Ischemic cardiomyopathy: Secondary | ICD-10-CM | POA: Diagnosis not present

## 2017-11-14 DIAGNOSIS — Z01812 Encounter for preprocedural laboratory examination: Secondary | ICD-10-CM

## 2017-11-14 NOTE — Addendum Note (Signed)
Addended by: [name] on: 11/14/2017 04:23 PM   Modules accepted: Orders

## 2017-11-14 NOTE — Telephone Encounter (Signed)
[name] made aware that generator replacement needed soon- ERI 08/25/18. She reports hearing the alert tone recently but has a lot of family stuff going on and hasn't had time to call the office. She seems agreeable to generator replacement on Friday 11/17/17 with Dr. Caryl Comes- I will route the call the [name], RN for further advisement. [name] is appreciative.

## 2017-11-14 NOTE — Telephone Encounter (Signed)
Pt agreed to have labs drawn 2/27 when she picks up her soap and procedure instructions.

## 2017-11-14 NOTE — Telephone Encounter (Signed)
ICD scheduled transmission received- ICD ERI as of 08/25/17. No alert transmission received. LMOM to return call to [facility] at earliest convenience.

## 2017-11-14 NOTE — Progress Notes (Signed)
Remote ICD transmission.   

## 2017-11-14 NOTE — Telephone Encounter (Signed)
Pt agreed to have change out done on March 1. Soap and letter will be placed up front. Pt educated on procedure time, medications to hold, and surgical soap for the day of procedure. She stated she understood and had no additional questions.

## 2017-11-15 ENCOUNTER — Other Ambulatory Visit: Payer: Self-pay | Admitting: *Deleted

## 2017-11-15 ENCOUNTER — Other Ambulatory Visit: Payer: Medicare Other | Admitting: *Deleted

## 2017-11-15 ENCOUNTER — Encounter: Payer: Medicare Other | Admitting: Internal Medicine

## 2017-11-15 DIAGNOSIS — Z01812 Encounter for preprocedural laboratory examination: Secondary | ICD-10-CM

## 2017-11-15 LAB — BASIC METABOLIC PANEL
BUN / CREAT RATIO: 14 (ref 9–23)
BUN: 18 mg/dL (ref 6–24)
CHLORIDE: 104 mmol/L (ref 96–106)
CO2: 19 mmol/L — ABNORMAL LOW (ref 20–29)
Calcium: 9 mg/dL (ref 8.7–10.2)
Creatinine, Ser: 1.26 mg/dL — ABNORMAL HIGH (ref 0.57–1.00)
GFR calc non Af Amer: 47 mL/min/{1.73_m2} — ABNORMAL LOW (ref >59)
GFR, EST AFRICAN AMERICAN: 54 mL/min/{1.73_m2} — AB (ref >59)
Glucose: 90 mg/dL (ref 65–99)
POTASSIUM: 4.1 mmol/L (ref 3.5–5.2)
SODIUM: 136 mmol/L (ref 134–144)

## 2017-11-15 LAB — CBC WITH DIFFERENTIAL/PLATELET
BASOS ABS: 0 10*3/uL (ref 0.0–0.2)
BASOS: 0 %
EOS (ABSOLUTE): 0.3 10*3/uL (ref 0.0–0.4)
Eos: 3 %
HEMOGLOBIN: 11.6 g/dL (ref 11.1–15.9)
Hematocrit: 35.8 % (ref 34.0–46.6)
IMMATURE GRANS (ABS): 0 10*3/uL (ref 0.0–0.1)
Immature Granulocytes: 0 %
LYMPHS: 32 %
Lymphocytes Absolute: 3.2 10*3/uL — ABNORMAL HIGH (ref 0.7–3.1)
MCH: 27.7 pg (ref 26.6–33.0)
MCHC: 32.4 g/dL (ref 31.5–35.7)
MCV: 85 fL (ref 79–97)
MONOCYTES: 8 %
Monocytes Absolute: 0.8 10*3/uL (ref 0.1–0.9)
NEUTROS ABS: 5.5 10*3/uL (ref 1.4–7.0)
NEUTROS PCT: 57 %
PLATELETS: 245 10*3/uL (ref 150–379)
RBC: 4.19 x10E6/uL (ref 3.77–5.28)
RDW: 14.2 % (ref 12.3–15.4)
WBC: 9.8 10*3/uL (ref 3.4–10.8)

## 2017-11-15 NOTE — Addendum Note (Signed)
Addended by: [name] on: 11/15/2017 11:15 AM   Modules accepted: Orders

## 2017-11-16 ENCOUNTER — Encounter: Payer: Self-pay | Admitting: Cardiology

## 2017-11-17 ENCOUNTER — Ambulatory Visit (HOSPITAL_COMMUNITY)
Admission: RE | Admit: 2017-11-17 | Discharge: 2017-11-17 | Disposition: A | Discharge status: Home / Self Care | Payer: Medicare Other | Source: Ambulatory Visit | Attending: Internal Medicine | Admitting: Internal Medicine

## 2017-11-17 ENCOUNTER — Encounter (HOSPITAL_COMMUNITY)
Admission: RE | Disposition: A | Discharge status: Home / Self Care | Payer: Self-pay | Source: Ambulatory Visit | Attending: Internal Medicine

## 2017-11-17 ENCOUNTER — Encounter (HOSPITAL_COMMUNITY): Payer: Self-pay | Admitting: Internal Medicine

## 2017-11-17 DIAGNOSIS — Z87891 Personal history of nicotine dependence: Secondary | ICD-10-CM | POA: Diagnosis not present

## 2017-11-17 DIAGNOSIS — I252 Old myocardial infarction: Secondary | ICD-10-CM | POA: Diagnosis not present

## 2017-11-17 DIAGNOSIS — I255 Ischemic cardiomyopathy: Secondary | ICD-10-CM | POA: Diagnosis not present

## 2017-11-17 DIAGNOSIS — E785 Hyperlipidemia, unspecified: Secondary | ICD-10-CM | POA: Diagnosis not present

## 2017-11-17 DIAGNOSIS — Z7982 Long term (current) use of aspirin: Secondary | ICD-10-CM | POA: Insufficient documentation

## 2017-11-17 DIAGNOSIS — Z955 Presence of coronary angioplasty implant and graft: Secondary | ICD-10-CM | POA: Insufficient documentation

## 2017-11-17 DIAGNOSIS — Z79899 Other long term (current) drug therapy: Secondary | ICD-10-CM | POA: Insufficient documentation

## 2017-11-17 DIAGNOSIS — I471 Supraventricular tachycardia: Secondary | ICD-10-CM | POA: Insufficient documentation

## 2017-11-17 DIAGNOSIS — Z4502 Encounter for adjustment and management of automatic implantable cardiac defibrillator: Secondary | ICD-10-CM | POA: Insufficient documentation

## 2017-11-17 DIAGNOSIS — I5022 Chronic systolic (congestive) heart failure: Secondary | ICD-10-CM | POA: Insufficient documentation

## 2017-11-17 HISTORY — PX: ICD GENERATOR CHANGEOUT: EP1231

## 2017-11-17 LAB — SURGICAL PCR SCREEN
MRSA, PCR: NEGATIVE
STAPHYLOCOCCUS AUREUS: NEGATIVE

## 2017-11-17 SURGERY — ICD GENERATOR CHANGEOUT

## 2017-11-17 MED ORDER — CHLORHEXIDINE GLUCONATE 4 % EX LIQD: 60.0000 mL | Freq: Once | CUTANEOUS | Status: DC

## 2017-11-17 MED ORDER — SODIUM CHLORIDE 0.9 % IR SOLN
80.0000 mg | Status: AC
  Administered 2017-11-17: 80 mg

## 2017-11-17 MED ORDER — CEFAZOLIN SODIUM-DEXTROSE 2-4 GM/100ML-% IV SOLN
2.0000 g | INTRAVENOUS | Status: AC
  Administered 2017-11-17: 2 g via INTRAVENOUS

## 2017-11-17 MED ORDER — LIDOCAINE HCL (PF) 1 % IJ SOLN
INTRAMUSCULAR | Status: DC | PRN
  Administered 2017-11-17: 50 mL

## 2017-11-17 MED ORDER — SODIUM CHLORIDE 0.9 % IV SOLN: INTRAVENOUS | Status: DC

## 2017-11-17 MED ORDER — LIDOCAINE HCL 1 % IJ SOLN
INTRAMUSCULAR | Status: AC
  Filled 2017-11-17: qty 20

## 2017-11-17 MED ORDER — CEFAZOLIN SODIUM-DEXTROSE 2-4 GM/100ML-% IV SOLN
INTRAVENOUS | Status: AC
  Filled 2017-11-17: qty 100

## 2017-11-17 MED ORDER — FENTANYL CITRATE (PF) 100 MCG/2ML IJ SOLN
INTRAMUSCULAR | Status: DC | PRN
  Administered 2017-11-17: 50 ug via INTRAVENOUS

## 2017-11-17 MED ORDER — FENTANYL CITRATE (PF) 100 MCG/2ML IJ SOLN
INTRAMUSCULAR | Status: AC
  Filled 2017-11-17: qty 2

## 2017-11-17 MED ORDER — ONDANSETRON HCL 4 MG/2ML IJ SOLN: 4.0000 mg | Freq: Four times a day (QID) | INTRAMUSCULAR | Status: DC | PRN

## 2017-11-17 MED ORDER — SODIUM CHLORIDE 0.9 % IR SOLN
Status: AC
  Filled 2017-11-17: qty 2

## 2017-11-17 MED ORDER — MIDAZOLAM HCL 5 MG/5ML IJ SOLN
INTRAMUSCULAR | Status: DC | PRN
  Administered 2017-11-17: 1 mg via INTRAVENOUS
  Administered 2017-11-17: 2 mg via INTRAVENOUS

## 2017-11-17 MED ORDER — MIDAZOLAM HCL 5 MG/5ML IJ SOLN
INTRAMUSCULAR | Status: AC
  Filled 2017-11-17: qty 5

## 2017-11-17 MED ORDER — SODIUM CHLORIDE 0.9 % IV SOLN: INTRAVENOUS | Status: AC

## 2017-11-17 MED ORDER — MUPIROCIN 2 % EX OINT
TOPICAL_OINTMENT | CUTANEOUS | Status: AC
  Filled 2017-11-17: qty 22

## 2017-11-17 MED ORDER — MUPIROCIN 2 % EX OINT: 1.0000 "application " | TOPICAL_OINTMENT | Freq: Once | CUTANEOUS | Status: DC

## 2017-11-17 MED ORDER — ACETAMINOPHEN 325 MG PO TABS: 325.0000 mg | ORAL_TABLET | ORAL | Status: DC | PRN

## 2017-11-17 SURGICAL SUPPLY — 4 items
CABLE SURGICAL S-101-97-12 (CABLE) ×3 IMPLANT
ICD VISIA MRI DVFB1D1 (ICD Generator) ×3 IMPLANT
PAD DEFIB LIFELINK (PAD) ×3 IMPLANT
TRAY PACEMAKER INSERTION (PACKS) ×3 IMPLANT

## 2017-11-17 NOTE — H&P (Signed)
Patient Care Team: Redmond School, MD as PCP - General (Internal Medicine)   HPI  [name] [name] is a 62 y.o. female Admitted for ICD generator replacement  She has hx of  ICD implanted for primary prevention OCT 2011 . s/p anterior MI 2010, who underwent primary PCI at that time. She had persisent severe LV dysfunction   Most recent echo 2/14 demonstrated EF 35-40%   The patient denies chest pain, shortness of breath, nocturnal dyspnea, orthopnea or peripheral edema.  There have been no palpitations, lightheadedness or syncope.    No smoking   Appropriate Therapy no Inappropriate Therapy yes          Past Medical History:  Diagnosis Date  . Cervical disc disease   . Depression   . Dyslipidemia   . GERD (gastroesophageal reflux disease)   . ICD (implantable cardiac defibrillator), single MDT    treated with BMS to LAD  . Ischemic cardiomyopathy    post-MI LVEF less than 30%; acute AMI Rx w BMS>>LAD         Past Surgical History:  Procedure Laterality Date  . CERVICAL DISC SURGERY    . CORONARY ANGIOPLASTY WITH STENT PLACEMENT    . icd implanted    . partial right masectomy               Current Facility-Administered Medications  Medication Dose Route Frequency Provider Last Rate Last Dose  . 0.9 %  sodium chloride infusion   Intravenous Continuous Deboraha Sprang, MD      . ceFAZolin (ANCEF) IVPB 2g/100 mL premix  2 g Intravenous On Call Deboraha Sprang, MD      . chlorhexidine (HIBICLENS) 4 % liquid 4 application  60 mL Topical Once Deboraha Sprang, MD      . gentamicin (GARAMYCIN) 80 mg in sodium chloride irrigation 0.9 % 500 mL irrigation  80 mg Irrigation On Call Deboraha Sprang, MD      . mupirocin ointment (BACTROBAN) 2 % 1 application  1 application Topical Once Deboraha Sprang, MD      . mupirocin ointment (BACTROBAN) 2 %             No Known Allergies    Social History        Tobacco Use  . Smoking  status: Former Smoker    Packs/day: 0.50    Years: 10.00    Pack years: 5.00  . Smokeless tobacco: Never Used  . Tobacco comment: smoked 1/2 ppd for at least 10 years-quit smoking at the time of her MI  Substance Use Topics  . Alcohol use: No  . Drug use: No          Family History  Problem Relation Age of Onset  . Heart attack Mother      ActiveMedications      Current Meds  Medication Sig  . acetaminophen (TYLENOL) 325 MG tablet Take 650 mg by mouth every 6 (six) hours as needed (FOR PAIN.).   Marland Kitchen aspirin EC 81 MG EC tablet Take 1 tablet (81 mg total) by mouth daily.  . carvedilol (COREG) 12.5 MG tablet TAKE 1 TABLET(12.5 MG) BY MOUTH TWICE DAILY  . DULoxetine (CYMBALTA) 60 MG capsule Take 60 mg by mouth daily.   . furosemide (LASIX) 20 MG tablet Take 1 tablet (20 mg total) by mouth every other day as needed (swelling). (Patient taking differently: Take 20 mg by mouth every other day.  IN THE MORNING.)  . losartan (COZAAR) 50 MG tablet TAKE 1 TABLET BY MOUTH EVERY DAY (Patient taking differently: TAKE 1 TABLET BY MOUTH EVERY DAY IN THE EVENING)  . pantoprazole (PROTONIX) 40 MG tablet Take 1 tablet (40 mg total) by mouth daily.  . potassium chloride SA (K-DUR,KLOR-CON) 20 MEQ tablet TAKE 2 TABLETS BY MOUTH EVERY DAY  . rosuvastatin (CRESTOR) 40 MG tablet TAKE 1 TABLET(40 MG) BY MOUTH EVERY EVENING       Review of Systems negative except from HPI and PMH  Physical Exam BP 123/76   Pulse 67   Temp 98.4 F (36.9 C) (Oral)   Resp 18   Ht 5\' 4"  (1.626 m)   Wt 155 lb (70.3 kg)   SpO2 100%   BMI 26.61 kg/m  Well developed and well nourished in no acute distress HENT normal E scleral and icterus clear Neck Supple JVP flat; carotids brisk and full Clear to ausculation Device pocket well healed; without hematoma or erythema.  There is no tethering  Regular rate and rhythm, no murmurs gallops or rub Soft with active bowel sounds No clubbing cyanosis   Edema Alert and oriented, grossly normal motor and sensory function Skin Warm and Dry    Assessment and  Plan\ Ischemic CardiomyopathyStable on current meds  CHF systolic chroniceuvolemic Blood work drawn today, she takes lots of K  VT  SVT    Discussion re ICD generator change  We have reviewed the benefits and risks of generator replacement.  These include but are not limited to lead fracture and infection.  The patient understands, agrees and is willing to proceed.

## 2017-11-17 NOTE — Discharge Instructions (Signed)
**Note Jerolene Kupfer-Identified via Obfuscation** Pacemaker Battery Change, Care After This sheet gives you information about how to care for yourself after your procedure. Your health care provider may also give you more specific instructions. If you have problems or questions, contact your health care provider. What can I expect after the procedure? After your procedure, it is common to have:  Pain or soreness at the site where the pacemaker was inserted.  Swelling at the site where the pacemaker was inserted.  Follow these instructions at home: Incision care  Keep the incision clean and dry. ? Do not take baths, swim, or use a hot tub until your health care provider approves. ? You may shower the day after your procedure, or as directed by your health care provider. ? Pat the area dry with a clean towel. Do not rub the area. This may cause bleeding.  Follow instructions from your health care provider about how to take care of your incision. Make sure you: ? Wash your hands with soap and water before you change your bandage (dressing). If soap and water are not available, use hand sanitizer. ? Change your dressing as told by your health care provider. ? Leave stitches (sutures), skin glue, or adhesive strips in place. These skin closures may need to stay in place for 2 weeks or longer. If adhesive strip edges start to loosen and curl up, you may trim the loose edges. Do not remove adhesive strips completely unless your health care provider tells you to do that.  Dermabond will come off in next 10-14 days; if not will remove at wound check  Keep wound dry until tomorrow evening No Driving x 4 days Wound check in office as scheduled    Check your incision area every day for signs of infection. Check for: ? More redness, swelling, or pain. ? More fluid or blood. ? Warmth. ? Pus or a bad smell. Activity  Do not lift anything that is heavier than 10 lb (4.5 kg) until your health care provider says it is okay to do so.  For the  first 2 weeks, or as long as told by your health care provider: ? Avoid lifting your left arm higher than your shoulder. ? Be gentle when you move your arms over your head. It is okay to raise your arm to comb your hair. ? Avoid strenuous exercise.  Ask your health care provider when it is okay to: ? Resume your normal activities. ? Return to work or school. ? Resume sexual activity. Eating and drinking  Eat a heart-healthy diet. This should include plenty of fresh fruits and vegetables, whole grains, low-fat dairy products, and lean protein like chicken and fish.  Limit alcohol intake to no more than 1 drink a day for non-pregnant women and 2 drinks a day for men. One drink equals 12 oz of beer, 5 oz of wine, or 1 oz of hard liquor.  Check ingredients and nutrition facts on packaged foods and beverages. Avoid the following types of food: ? Food that is high in salt (sodium). ? Food that is high in saturated fat, like full-fat dairy or red meat. ? Food that is high in trans fat, like fried food. ? Food and drinks that are high in sugar. Lifestyle  Do not use any products that contain nicotine or tobacco, such as cigarettes and e-cigarettes. If you need help quitting, ask your health care provider.  Take steps to manage and control your weight.  Get regular exercise. Aim for 150 minutes **Note [name] [name]-Identified via Obfuscation** of moderate-intensity exercise (such as walking or yoga) or 75 minutes of vigorous exercise (such as running or swimming) each week.  Manage other health problems, such as diabetes or high blood pressure. Ask your health care provider how you can manage these conditions. General instructions  Do not drive for 24 hours after your procedure if you were given a medicine to help you relax (sedative).  Take over-the-counter and prescription medicines only as told by your health care provider.  Avoid putting pressure on the area where the pacemaker was placed.  If you need an MRI after your pacemaker  has been placed, be sure to tell the health care provider who orders the MRI that you have a pacemaker.  Avoid close and prolonged exposure to electrical devices that have strong magnetic fields. These include: ? Cell phones. Avoid keeping them in a pocket near the pacemaker, and try using the ear opposite the pacemaker. ? MP3 players. ? Household appliances, like microwaves. ? Metal detectors. ? Electric generators. ? High-tension wires.  Keep all follow-up visits as directed by your health care provider. This is important. Contact a health care provider if:  You have pain at the incision site that is not relieved by over-the-counter or prescription medicines.  You have any of these around your incision site or coming from it: ? More redness, swelling, or pain. ? Fluid or blood. ? Warmth to the touch. ? Pus or a bad smell.  You have a fever.  You feel brief, occasional palpitations, light-headedness, or any symptoms that you think might be related to your heart. Get help right away if:  You experience chest pain that is different from the pain at the pacemaker site.  You develop a red streak that extends above or below the incision site.  You experience shortness of breath.  You have palpitations or an irregular heartbeat.  You have light-headedness that does not go away quickly.  You faint or have dizzy spells.  Your pulse suddenly drops or increases rapidly and does not return to normal.  You begin to gain weight and your legs and ankles swell. Summary  After your procedure, it is common to have pain, soreness, and some swelling where the pacemaker was inserted.  Make sure to keep your incision clean and dry. Follow instructions from your health care provider about how to take care of your incision.  Check your incision every day for signs of infection, such as more pain or swelling, pus or a bad smell, warmth, or leaking fluid and blood.  Avoid strenuous exercise  and lifting your left arm higher than your shoulder for 2 weeks, or as long as told by your health care provider. This information is not intended to replace advice given to you by your health care provider. Make sure you discuss any questions you have with your health care provider. Document Released: 06/26/2013 Document Revised: 07/28/2016 Document Reviewed: 07/28/2016 Elsevier Interactive Patient Education  2017 [employer].

## 2017-11-17 NOTE — Interval H&P Note (Signed)
ICD Criteria  Current LVEF:35%. Within 12 months prior to implant: No   Heart failure history: Yes, Class II  Cardiomyopathy history: Yes, Ischemic Cardiomyopathy - Prior MI.  Atrial Fibrillation/Atrial Flutter: No.  Ventricular tachycardia history: No.  Cardiac arrest history: No.  History of syndromes with risk of sudden death: No.  Previous ICD: Yes, Reason for ICD:  Primary prevention.  Current ICD indication: Primary  PPM indication: No.   Class I or II Bradycardia indication present: No  Beta Blocker therapy for 3 or more months: Yes, prescribed.   Ace Inhibitor/ARB therapy for 3 or more months: Yes, prescribed.   History and Physical Interval Note:  11/17/2017 10:14 AM  [name] [name]  has presented today for surgery, with the diagnosis of eri  The various methods of treatment have been discussed with the patient and family. After consideration of risks, benefits and other options for treatment, the patient has consented to  Procedure(s): PPM GENERATOR CHANGEOUT (N/A) as a surgical intervention .  The patient's history has been reviewed, patient examined, no change in status, stable for surgery.  I have reviewed the patient's chart and labs.  Questions were answered to the patient's satisfaction.     Virl Axe

## 2017-11-20 ENCOUNTER — Telehealth: Payer: Self-pay | Admitting: Internal Medicine

## 2017-11-20 NOTE — Telephone Encounter (Signed)
New message    Patient has questions about surgery that was done on last Friday 3/1.

## 2017-11-21 NOTE — Telephone Encounter (Signed)
LM for pt to call back.

## 2017-11-21 NOTE — Telephone Encounter (Signed)
Lorenda Hatchet (scheduler) to contact patient to arrange wound check and 91d follow up with Dr. Caryl Comes.

## 2017-11-21 NOTE — Telephone Encounter (Signed)
Pt had questions about her follow up appointments. I noticed she was not scheduled for a wound check. Will forward to device clinic triage to have this set up as all their availability was full.

## 2017-11-21 NOTE — Telephone Encounter (Signed)
Mrs. [name] [name] is returning your call. Thanks

## 2017-11-28 ENCOUNTER — Ambulatory Visit (INDEPENDENT_AMBULATORY_CARE_PROVIDER_SITE_OTHER): Payer: Medicare Other | Admitting: *Deleted

## 2017-11-28 DIAGNOSIS — I255 Ischemic cardiomyopathy: Secondary | ICD-10-CM | POA: Diagnosis not present

## 2017-11-28 DIAGNOSIS — I472 Ventricular tachycardia, unspecified: Secondary | ICD-10-CM

## 2017-11-28 DIAGNOSIS — Z95810 Presence of automatic (implantable) cardiac defibrillator: Secondary | ICD-10-CM

## 2017-11-28 LAB — CUP PACEART INCLINIC DEVICE CHECK
Battery Remaining Longevity: 132 mo
Battery Voltage: 3.04 V
Brady Statistic RV Percent Paced: 0.01 %
Date Time Interrogation Session: 20190312121353
HighPow Impedance: 67 Ohm
Implantable Lead Implant Date: 20110110
Implantable Pulse Generator Implant Date: 20190301
Lead Channel Impedance Value: 494 Ohm
Lead Channel Pacing Threshold Pulse Width: 0.4 ms
Lead Channel Sensing Intrinsic Amplitude: 19.625 mV
Lead Channel Setting Pacing Pulse Width: 0.4 ms
MDC IDC LEAD LOCATION: 753860
MDC IDC MSMT LEADCHNL RV IMPEDANCE VALUE: 551 Ohm
MDC IDC MSMT LEADCHNL RV PACING THRESHOLD AMPLITUDE: 0.75 V
MDC IDC SET LEADCHNL RV PACING AMPLITUDE: 2.5 V
MDC IDC SET LEADCHNL RV SENSING SENSITIVITY: 0.3 mV

## 2017-11-28 NOTE — Progress Notes (Signed)
Wound check appointment. Tegaderm/gauze dressing and steri-strips removed. Wound without redness or edema. Incision edges approximated, wound healing well. Normal device function. Thresholds, sensing, and impedances consistent with implant measurements. Device programmed at chronic output. Max RV pacing impedance increased to 2000ohms per clinic protocol. Histogram distribution appropriate for patient and level of activity. No AF or ventricular arrhythmias noted. Patient educated about wound care, arm mobility, lifting restrictions, shock plan, and Carelink monitor. ROV with SK on 02/20/18.

## 2017-11-29 LAB — CUP PACEART REMOTE DEVICE CHECK
Date Time Interrogation Session: 20190226093828
HighPow Impedance: 74 Ohm
Implantable Pulse Generator Implant Date: 20110110
Lead Channel Impedance Value: 589 Ohm
Lead Channel Sensing Intrinsic Amplitude: 17.25 mV
Lead Channel Setting Pacing Amplitude: 2.5 V
Lead Channel Setting Sensing Sensitivity: 0.3 mV
MDC IDC MSMT BATTERY VOLTAGE: 2.61 V
MDC IDC MSMT LEADCHNL RV SENSING INTR AMPL: 17.25 mV
MDC IDC SET LEADCHNL RV PACING PULSEWIDTH: 0.4 ms
MDC IDC STAT BRADY RV PERCENT PACED: 0 %

## 2017-12-18 ENCOUNTER — Other Ambulatory Visit: Payer: Self-pay | Admitting: Internal Medicine

## 2018-01-09 ENCOUNTER — Encounter: Payer: Medicare Other | Admitting: Internal Medicine

## 2018-01-27 ENCOUNTER — Other Ambulatory Visit: Payer: Self-pay | Admitting: Internal Medicine

## 2018-01-30 ENCOUNTER — Encounter: Payer: Self-pay | Admitting: Internal Medicine

## 2018-01-30 NOTE — Telephone Encounter (Signed)
Please have pt follow up with PCP for further refills.

## 2018-01-30 NOTE — Telephone Encounter (Signed)
Okay to continue to refill? Please advise. Thanks, MI 

## 2018-01-31 ENCOUNTER — Other Ambulatory Visit: Payer: Self-pay | Admitting: Cardiovascular Disease

## 2018-01-31 ENCOUNTER — Other Ambulatory Visit: Payer: Self-pay

## 2018-01-31 MED ORDER — PANTOPRAZOLE SODIUM 40 MG PO TBEC: 40.0000 mg | DELAYED_RELEASE_TABLET | Freq: Every day | ORAL | 2 refills | Status: DC

## 2018-02-20 ENCOUNTER — Encounter: Payer: Medicare Other | Admitting: Internal Medicine

## 2018-02-28 ENCOUNTER — Encounter: Payer: Self-pay | Admitting: Internal Medicine

## 2018-02-28 ENCOUNTER — Ambulatory Visit: Payer: Medicare Other | Admitting: Internal Medicine

## 2018-02-28 VITALS — BP 92/70 | HR 62 | Ht 64.0 in | Wt 155.0 lb

## 2018-02-28 DIAGNOSIS — Z95810 Presence of automatic (implantable) cardiac defibrillator: Secondary | ICD-10-CM | POA: Diagnosis not present

## 2018-02-28 DIAGNOSIS — I472 Ventricular tachycardia, unspecified: Secondary | ICD-10-CM

## 2018-02-28 DIAGNOSIS — I255 Ischemic cardiomyopathy: Secondary | ICD-10-CM | POA: Diagnosis not present

## 2018-02-28 LAB — CUP PACEART INCLINIC DEVICE CHECK
Battery Remaining Longevity: 130 mo
HighPow Impedance: 68 Ohm
Implantable Lead Implant Date: 20110110
Implantable Lead Location: 753860
Implantable Pulse Generator Implant Date: 20190301
Lead Channel Pacing Threshold Amplitude: 0.75 V
Lead Channel Pacing Threshold Pulse Width: 0.4 ms
Lead Channel Sensing Intrinsic Amplitude: 16.375 mV
Lead Channel Sensing Intrinsic Amplitude: 17.625 mV
Lead Channel Setting Pacing Pulse Width: 0.4 ms
MDC IDC MSMT BATTERY VOLTAGE: 3.04 V
MDC IDC MSMT LEADCHNL RV IMPEDANCE VALUE: 437 Ohm
MDC IDC MSMT LEADCHNL RV IMPEDANCE VALUE: 513 Ohm
MDC IDC SESS DTM: 20190612103535
MDC IDC SET LEADCHNL RV PACING AMPLITUDE: 2.5 V
MDC IDC SET LEADCHNL RV SENSING SENSITIVITY: 0.3 mV
MDC IDC STAT BRADY RV PERCENT PACED: 0.01 %

## 2018-02-28 NOTE — Patient Instructions (Signed)
Medication Instructions:  Your physician recommends that you continue on your current medications as directed. Please refer to the Current Medication list given to you today.  Labwork: None ordered.  Testing/Procedures: None ordered.  Follow-Up: Your physician wants you to follow-up in: 9 months with Tommye Standard. You will receive a reminder letter in the mail two months in advance. If you don't receive a letter, please call our office to schedule the follow-up appointment.  Remote monitoring is used to monitor your Pacemaker of ICD from home. This monitoring reduces the number of office visits required to check your device to one time per year. It allows [place] to keep an eye on the functioning of your device to ensure it is working properly. You are scheduled for a device check from home on 05/30/2018. You may send your transmission at any time that day. If you have a wireless device, the transmission will be sent automatically. After your physician reviews your transmission, you will receive a postcard with your next transmission date.    Any Other Special Instructions Will Be Listed Below (If Applicable).     If you need a refill on your cardiac medications before your next appointment, please call your pharmacy.

## 2018-02-28 NOTE — Progress Notes (Signed)
Patient Care Team: Redmond School, MD as PCP - General (Internal Medicine)   HPI  [name] [name] is a 62 y.o. female  seen in followup for an ICD implanted for primary prevention OCT 2011 . She is a 62 year-old woman s/p anterior MI 2010, who underwent primary PCI at that time. She had persisent severe LV dysfunction she underwent device generator replacement 3/19  Most recent echo 2/14 demonstrated EF 35-40%    Appropriate Therapy no Inappropriate Therapy yes   Date Cr K  2/19 1.26 4.1        The patient denies chest pain, shortness of breath, nocturnal dyspnea, orthopnea or peripheral edema.  There have been no palpitations, lightheadedness or syncope.  Device well healed she was  Past Medical History:  Diagnosis Date  . Cervical disc disease   . Depression   . Dyslipidemia   . GERD (gastroesophageal reflux disease)   . ICD (implantable cardiac defibrillator), single MDT    treated with BMS to LAD  . Ischemic cardiomyopathy    post-MI LVEF less than 30%; acute AMI Rx w BMS>>LAD    Past Surgical History:  Procedure Laterality Date  . CERVICAL DISC SURGERY    . CORONARY ANGIOPLASTY WITH STENT PLACEMENT    . ICD GENERATOR CHANGEOUT N/A 11/17/2017   Procedure: ICD GENERATOR CHANGEOUT;  Surgeon: Deboraha Sprang, MD;  Location: [facility];  Service: Cardiovascular;  Laterality: N/A;  . icd implanted    . partial right masectomy      Current Outpatient Medications  Medication Sig Dispense Refill  . acetaminophen (TYLENOL) 325 MG tablet Take 650 mg by mouth every 6 (six) hours as needed (FOR PAIN.).     Marland Kitchen aspirin EC 81 MG EC tablet Take 1 tablet (81 mg total) by mouth daily. 30 tablet 6  . carvedilol (COREG) 12.5 MG tablet TAKE 1 TABLET(12.5 MG) BY MOUTH TWICE DAILY 180 tablet 0  . DULoxetine (CYMBALTA) 60 MG capsule Take 60 mg by mouth daily.   3  . furosemide (LASIX) 20 MG tablet TAKE 1 TABLET(20 MG) BY MOUTH EVERY OTHER DAY AS NEEDED FOR SWELLING 45 tablet  0  . losartan (COZAAR) 50 MG tablet TAKE 1 TABLET BY MOUTH EVERY DAY (Patient taking differently: TAKE 1 TABLET BY MOUTH EVERY DAY IN THE EVENING) 90 tablet 3  . nitroGLYCERIN (NITROSTAT) 0.4 MG SL tablet Place 0.4 mg under the tongue every 5 (five) minutes x 3 doses as needed for chest pain.     . pantoprazole (PROTONIX) 40 MG tablet Take 1 tablet (40 mg total) by mouth daily. 30 tablet 2  . potassium chloride SA (K-DUR,KLOR-CON) 20 MEQ tablet TAKE 2 TABLETS BY MOUTH EVERY DAY 180 tablet 2  . rosuvastatin (CRESTOR) 40 MG tablet TAKE 1 TABLET(40 MG) BY MOUTH EVERY EVENING 30 tablet 11   No current facility-administered medications for this visit.     No Known Allergies  Review of Systems negative except from HPI and PMH  Physical Exam BP 92/70   Pulse 62   Ht 5\' 4"  (1.626 m)   Wt 155 lb (70.3 kg)   SpO2 96%   BMI 26.61 kg/m  Well developed and nourished in no acute distress HENT normal Neck supple with JVP-flat Clear Device pocket well healed; without hematoma or erythema.  There is no tethering Regular rate and rhythm, no murmurs or gallops Abd-soft with active BS No Clubbing cyanosis edema Skin-warm and dry A & Oriented  Grossly normal sensory and motor function  ECG demonstrates sinus at 62  interval 16/09/39  Assessment and  Plan  Ischemic Cardiomyopathy     CHF systolic chronic    VT  SVT  Hypotension  ICD Medtronic  The patient's device was interrogated.  The information was reviewed. No changes were made in the programming.      No intercurrent Ventricular tachycardia or supra Ventricular tachycardia  Associate with without symptoms of ischemia  Euvolemic continue current meds  No longer smoking

## 2018-03-14 ENCOUNTER — Other Ambulatory Visit: Payer: Self-pay | Admitting: Internal Medicine

## 2018-03-23 ENCOUNTER — Other Ambulatory Visit: Payer: Self-pay | Admitting: Cardiovascular Disease

## 2018-04-18 ENCOUNTER — Encounter

## 2018-04-25 ENCOUNTER — Other Ambulatory Visit: Payer: Self-pay | Admitting: Cardiovascular Disease

## 2018-05-15 ENCOUNTER — Other Ambulatory Visit: Payer: Self-pay | Admitting: Cardiovascular Disease

## 2018-05-22 ENCOUNTER — Encounter

## 2018-05-30 ENCOUNTER — Ambulatory Visit (INDEPENDENT_AMBULATORY_CARE_PROVIDER_SITE_OTHER): Payer: Medicare Other | Admitting: *Deleted

## 2018-05-30 DIAGNOSIS — I255 Ischemic cardiomyopathy: Secondary | ICD-10-CM | POA: Diagnosis not present

## 2018-05-30 NOTE — Progress Notes (Signed)
Remote ICD transmission.   

## 2018-06-01 ENCOUNTER — Other Ambulatory Visit: Payer: Self-pay | Admitting: Cardiovascular Disease

## 2018-06-04 ENCOUNTER — Telehealth: Payer: Self-pay | Admitting: Cardiovascular Disease

## 2018-06-04 MED ORDER — ROSUVASTATIN CALCIUM 40 MG PO TABS: 40.0000 mg | ORAL_TABLET | Freq: Every day | ORAL | 1 refills | Status: DC

## 2018-06-04 MED ORDER — LOSARTAN POTASSIUM 50 MG PO TABS: 50.0000 mg | ORAL_TABLET | Freq: Every day | ORAL | 1 refills | Status: DC

## 2018-06-04 NOTE — Telephone Encounter (Signed)
New Message:      *STAT* If patient is at the pharmacy, call can be transferred to refill team  1. Which medications need to be refilled? (please list name of each medication and dose if known)       carvedilol (COREG) 12.5 MG tablet TAKE 1 TABLET(12.5 MG) BY MOUTH TWICE DAILY   furosemide (LASIX) 20 MG tablet TAKE 1 TABLET BY MOUTH EVERY OTHER DAY   losartan (COZAAR) 50 MG tablet Take 1 tablet (50 mg total) by mouth daily. Patient overdue for an appointment and needs to call and schedule for further refills2nd attempt               potassium chloride SA (K-DUR,KLOR-CON) 20 MEQ tablet TAKE 2 TABLETS BY MOUTH EVERY DAY   rosuvastatin (CRESTOR) 40 MG tablet Take 1 tablet (40 mg total) by mouth daily. Patient overdue for an appointment and needs to call and schedule for further refills2nd attempt    2. Which pharmacy/location (including street and city if local pharmacy) is medication to be sent to? [id number] - [place], [place]  3. Do they need a 30 day or 90 day supply? 90 Days

## 2018-06-04 NOTE — Telephone Encounter (Signed)
Pt's medication was sent to pt's pharmacy as requested. Confirmation received.  °

## 2018-06-13 ENCOUNTER — Other Ambulatory Visit: Payer: Self-pay | Admitting: Cardiovascular Disease

## 2018-06-14 ENCOUNTER — Other Ambulatory Visit: Payer: Self-pay

## 2018-06-14 MED ORDER — LOSARTAN POTASSIUM 50 MG PO TABS: 50.0000 mg | ORAL_TABLET | Freq: Every day | ORAL | 1 refills | Status: DC

## 2018-06-14 MED ORDER — ROSUVASTATIN CALCIUM 40 MG PO TABS: 40.0000 mg | ORAL_TABLET | Freq: Every day | ORAL | 1 refills | Status: DC

## 2018-06-20 LAB — CUP PACEART REMOTE DEVICE CHECK
Battery Remaining Longevity: 128 mo
Battery Voltage: 3.03 V
HIGH POWER IMPEDANCE MEASURED VALUE: 79 Ohm
Implantable Lead Implant Date: 20110110
Implantable Pulse Generator Implant Date: 20190301
Lead Channel Impedance Value: 494 Ohm
Lead Channel Pacing Threshold Amplitude: 0.75 V
Lead Channel Setting Pacing Amplitude: 2.5 V
Lead Channel Setting Pacing Pulse Width: 0.4 ms
Lead Channel Setting Sensing Sensitivity: 0.3 mV
MDC IDC LEAD LOCATION: 753860
MDC IDC MSMT LEADCHNL RV IMPEDANCE VALUE: 551 Ohm
MDC IDC MSMT LEADCHNL RV PACING THRESHOLD PULSEWIDTH: 0.4 ms
MDC IDC MSMT LEADCHNL RV SENSING INTR AMPL: 16.25 mV
MDC IDC MSMT LEADCHNL RV SENSING INTR AMPL: 16.25 mV
MDC IDC SESS DTM: 20190911133326
MDC IDC STAT BRADY RV PERCENT PACED: 0.01 %

## 2018-06-27 ENCOUNTER — Ambulatory Visit: Payer: Medicare Other | Admitting: Cardiology

## 2018-06-27 ENCOUNTER — Encounter: Payer: Self-pay | Admitting: Cardiology

## 2018-06-27 VITALS — BP 108/52 | HR 73 | Ht 64.0 in | Wt 158.5 lb

## 2018-06-27 DIAGNOSIS — I5022 Chronic systolic (congestive) heart failure: Secondary | ICD-10-CM | POA: Diagnosis not present

## 2018-06-27 DIAGNOSIS — Z95810 Presence of automatic (implantable) cardiac defibrillator: Secondary | ICD-10-CM

## 2018-06-27 DIAGNOSIS — I2510 Atherosclerotic heart disease of native coronary artery without angina pectoris: Secondary | ICD-10-CM | POA: Diagnosis not present

## 2018-06-27 DIAGNOSIS — E785 Hyperlipidemia, unspecified: Secondary | ICD-10-CM | POA: Diagnosis not present

## 2018-06-27 NOTE — Progress Notes (Signed)
Cardiology Office Note:    Date:  06/27/2018   ID:  [name] [name], DOB 1955-12-29, MRN [id number]  PCP:  Redmond School, MD  Cardiologist:  Sherren Mocha, MD  Referring MD: Redmond School, MD   Chief Complaint  Patient presents with  . Follow-up    CAD, ICM    History of Present Illness:    [name] [name] is a 62 y.o. female with a past medical history significant for CAD s/p anterior MI 2010 with PCI to LAD, ICM, S/P ICD last gen change 11/2017. She had several ICD discharges in 09/2015, settings were adjusted with no further discharges.   She is here today for annual follow up. She is feeling very well. No ICD shocks. She is busy with her 7 grandchildren from ages 68 to 7. She walks every day for at least 30 minutes without any exertional symptoms. No chest discomfort, shortness of breath, palpitations, lightheadedness, orthopnea, PND or syncope.   She had labs done at Dr. Nolon Rod office in May in [place] and all reportedly normal.   Past Medical History:  Diagnosis Date  . Cervical disc disease   . Depression   . Dyslipidemia   . GERD (gastroesophageal reflux disease)   . ICD (implantable cardiac defibrillator), single MDT    treated with BMS to LAD  . Ischemic cardiomyopathy    post-MI LVEF less than 30%; acute AMI Rx w BMS>>LAD    Past Surgical History:  Procedure Laterality Date  . CERVICAL DISC SURGERY    . CORONARY ANGIOPLASTY WITH STENT PLACEMENT    . ICD GENERATOR CHANGEOUT N/A 11/17/2017   Procedure: ICD GENERATOR CHANGEOUT;  Surgeon: Deboraha Sprang, MD;  Location: [facility];  Service: Cardiovascular;  Laterality: N/A;  . icd implanted    . partial right masectomy      Current Medications: Current Meds  Medication Sig  . acetaminophen (TYLENOL) 325 MG tablet Take 650 mg by mouth every 6 (six) hours as needed (FOR PAIN.).   Marland Kitchen aspirin EC 81 MG EC tablet Take 1 tablet (81 mg total) by mouth daily.  . carvedilol (COREG) 12.5 MG tablet TAKE 1  TABLET(12.5 MG) BY MOUTH TWICE DAILY  . DULoxetine (CYMBALTA) 60 MG capsule Take 60 mg by mouth daily.   . furosemide (LASIX) 20 MG tablet TAKE 1 TABLET BY MOUTH EVERY OTHER DAY  . losartan (COZAAR) 50 MG tablet Take 1 tablet (50 mg total) by mouth daily. Please keep upcoming appt in October for future refills. Thank you  . nitroGLYCERIN (NITROSTAT) 0.4 MG SL tablet Place 0.4 mg under the tongue every 5 (five) minutes x 3 doses as needed for chest pain.   . pantoprazole (PROTONIX) 40 MG tablet TAKE 1 TABLET BY MOUTH EVERY DAY  . pantoprazole (PROTONIX) 40 MG tablet Take 1 tablet (40 mg total) by mouth daily.  . potassium chloride SA (K-DUR,KLOR-CON) 20 MEQ tablet TAKE 2 TABLETS BY MOUTH EVERY DAY  . rosuvastatin (CRESTOR) 40 MG tablet Take 1 tablet (40 mg total) by mouth daily. Please keep upcoming appt in October for future refills. Thank you     Allergies:   Patient has no known allergies.   Social History   Socioeconomic History  . Marital status: Divorced    Spouse name: Not on file  . Number of children: Not on file  . Years of education: Not on file  . Highest education level: Not on file  Occupational History  . Not on file  Social Needs  . Financial resource strain: Not on file  . Food insecurity:    Worry: Not on file    Inability: Not on file  . Transportation needs:    Medical: Not on file    Non-medical: Not on file  Tobacco Use  . Smoking status: Former Smoker    Packs/day: 0.50    Years: 10.00    Pack years: 5.00  . Smokeless tobacco: Never Used  . Tobacco comment: smoked 1/2 ppd for at least 10 years-quit smoking at the time of her MI  Substance and Sexual Activity  . Alcohol use: No  . Drug use: No  . Sexual activity: Not on file  Lifestyle  . Physical activity:    Days per week: Not on file    Minutes per session: Not on file  . Stress: Not on file  Relationships  . Social connections:    Talks on phone: Not on file    Gets together: Not on file     Attends religious service: Not on file    Active member of club or organization: Not on file    Attends meetings of clubs or organizations: Not on file    Relationship status: Not on file  Other Topics Concern  . Not on file  Social History Narrative  . Not on file     Family History: The patient's family history includes Heart attack in her mother. ROS:   Please see the history of present illness.     All other systems reviewed and are negative.  EKGs/Labs/Other Studies Reviewed:     EKG:  EKG is not ordered today.    Recent Labs: 11/15/2017: BUN 18; Creatinine, Ser 1.26; Hemoglobin 11.6; Platelets 245; Potassium 4.1; Sodium 136   Recent Lipid Panel    Component Value Date/Time   CHOL 114 (L) 02/24/2016 1041   TRIG 146 02/24/2016 1041   HDL 35 (L) 02/24/2016 1041   CHOLHDL 3.3 02/24/2016 1041   VLDL 29 02/24/2016 1041   LDLCALC 50 02/24/2016 1041   LDLDIRECT 70.8 10/27/2010 1020    Physical Exam:    VS:  BP (!) 108/52   Pulse 73   Ht 5\' 4"  (1.626 m)   Wt 158 lb 8 oz (71.9 kg)   SpO2 98%   BMI 27.21 kg/m     Wt Readings from Last 3 Encounters:  06/27/18 158 lb 8 oz (71.9 kg)  02/28/18 155 lb (70.3 kg)  11/17/17 155 lb (70.3 kg)     Physical Exam  Constitutional: She is oriented to person, place, and time. She appears well-developed and well-nourished. No distress.  HENT:  Head: Normocephalic and atraumatic.  Neck: Normal range of motion. Neck supple. No JVD present.  Cardiovascular: Normal rate, regular rhythm, normal heart sounds and intact distal pulses. Exam reveals no gallop and no friction rub.  No murmur heard. Pulmonary/Chest: Effort normal and breath sounds normal. No respiratory distress. She has no wheezes. She has no rales.  Abdominal: Soft. Bowel sounds are normal.  Musculoskeletal: Normal range of motion. She exhibits no edema or deformity.  Neurological: She is alert and oriented to person, place, and time.  Skin: Skin is warm and dry.    Psychiatric: She has a normal mood and affect. Her behavior is normal. Judgment and thought content normal.  Vitals reviewed.    ASSESSMENT:    1. Atherosclerosis of native coronary artery of native heart without angina pectoris   2. SYSTOLIC HEART FAILURE, CHRONIC  3. Dual implantable cardioverter-defibrillator in situ   4. Hyperlipidemia with target low density lipoprotein (LDL) cholesterol less than 70 mg/dL    PLAN:    In order of problems listed above:  1. CAD: pt is active with no anginal symptoms. Continue current medications with aspirin, carvedilol, ARB, statin.   2. Chronic systolic heart failure with New York Heart Association functional class I symptoms. She will continue on carvedilol and losartan  3. ICD Medtronic: followed by Dr. Caryl Comes: no ICD discharges.   4. Hyperlipidemia: On statin. Labs followed by PCP. Goal LDL <70 - reviewed goal with pt so that she can follow. Reviewed heart healthy diet and exercise.    Medication Adjustments/Labs and Tests Ordered: Current medicines are reviewed at length with the patient today.  Concerns regarding medicines are outlined above. Labs and tests ordered and medication changes are outlined in the patient instructions below:  Patient Instructions  Medication Instructions:  Your physician recommends that you continue on your current medications as directed. Please refer to the Current Medication list given to you today.  If you need a refill on your cardiac medications before your next appointment, please call your pharmacy.   Lab work: None Ordered  If you have labs (blood work) drawn today and your tests are completely normal, you will receive your results only by: Marland Kitchen MyChart Message (if you have MyChart) OR . A paper copy in the mail If you have any lab test that is abnormal or we need to change your treatment, we will call you to review the results.  Testing/Procedures: None Ordered  Follow-Up: At [facility],  you and your health needs are our priority.  As part of our continuing mission to provide you with exceptional heart care, we have created designated Provider Care Teams.  These Care Teams include your primary Cardiologist (physician) and Advanced Practice Providers (APPs -  Physician Assistants and Nurse Practitioners) who all work together to provide you with the care you need, when you need it. You will need a follow up appointment in:  12 months.  Please call our office 2 months in advance to schedule this appointment.  You may see Sherren Mocha, MD or one of the following Advanced Practice Providers on your designated Care Team: Richardson Dopp, PA-C [place], [place] . Daune Perch, NP  Any Other Special Instructions Will Be Listed Below (If Applicable).   DASH Eating Plan DASH stands for "Dietary Approaches to Stop Hypertension." The DASH eating plan is a healthy eating plan that has been shown to reduce high blood pressure (hypertension). It may also reduce your risk for type 2 diabetes, heart disease, and stroke. The DASH eating plan may also help with weight loss. What are tips for following this plan? General guidelines  Avoid eating more than 2,300 mg (milligrams) of salt (sodium) a day. If you have hypertension, you may need to reduce your sodium intake to 1,500 mg a day.  Limit alcohol intake to no more than 1 drink a day for nonpregnant women and 2 drinks a day for men. One drink equals 12 oz of beer, 5 oz of wine, or 1 oz of hard liquor.  Work with your health care provider to maintain a healthy body weight or to lose weight. Ask what an ideal weight is for you.  Get at least 30 minutes of exercise that causes your heart to beat faster (aerobic exercise) most days of the week. Activities may include walking, swimming, or biking.  Work  with your health care provider or diet and nutrition specialist (dietitian) to adjust your eating plan to your individual calorie needs. Reading  food labels  Check food labels for the amount of sodium per serving. Choose foods with less than 5 percent of the Daily Value of sodium. Generally, foods with less than 300 mg of sodium per serving fit into this eating plan.  To find whole grains, look for the word "whole" as the first word in the ingredient list. Shopping  Buy products labeled as "low-sodium" or "no salt added."  Buy fresh foods. Avoid canned foods and premade or frozen meals. Cooking  Avoid adding salt when cooking. Use salt-free seasonings or herbs instead of table salt or sea salt. Check with your health care provider or pharmacist before using salt substitutes.  Do not fry foods. Cook foods using healthy methods such as baking, boiling, grilling, and broiling instead.  Cook with heart-healthy oils, such as olive, canola, soybean, or sunflower oil. Meal planning   Eat a balanced diet that includes: ? 5 or more servings of fruits and vegetables each day. At each meal, try to fill half of your plate with fruits and vegetables. ? Up to 6-8 servings of whole grains each day. ? Less than 6 oz of lean meat, poultry, or fish each day. A 3-oz serving of meat is about the same size as a deck of cards. One egg equals 1 oz. ? 2 servings of low-fat dairy each day. ? A serving of nuts, seeds, or beans 5 times each week. ? Heart-healthy fats. Healthy fats called Omega-3 fatty acids are found in foods such as flaxseeds and coldwater fish, like sardines, salmon, and mackerel.  Limit how much you eat of the following: ? Canned or prepackaged foods. ? Food that is high in trans fat, such as fried foods. ? Food that is high in saturated fat, such as fatty meat. ? Sweets, desserts, sugary drinks, and other foods with added sugar. ? Full-fat dairy products.  Do not salt foods before eating.  Try to eat at least 2 vegetarian meals each week.  Eat more home-cooked food and less restaurant, buffet, and fast food.  When eating at  a restaurant, ask that your food be prepared with less salt or no salt, if possible. What foods are recommended? The items listed may not be a complete list. Talk with your dietitian about what dietary choices are best for you. Grains Whole-grain or whole-wheat bread. Whole-grain or whole-wheat pasta. Brown rice. [place]. Bulgur. Whole-grain and low-sodium cereals. Pita bread. Low-fat, low-sodium crackers. Whole-wheat flour tortillas. Vegetables Fresh or frozen vegetables (raw, steamed, roasted, or grilled). Low-sodium or reduced-sodium tomato and vegetable juice. Low-sodium or reduced-sodium tomato sauce and tomato paste. Low-sodium or reduced-sodium canned vegetables. Fruits All fresh, dried, or frozen fruit. Canned fruit in natural juice (without added sugar). Meat and other protein foods Skinless chicken or [place]. Ground chicken or [place]. Pork with fat trimmed off. Fish and seafood. Egg whites. Dried beans, peas, or lentils. Unsalted nuts, nut butters, and seeds. Unsalted canned beans. Lean cuts of beef with fat trimmed off. Low-sodium, lean deli meat. Dairy Low-fat (1%) or fat-free (skim) milk. Fat-free, low-fat, or reduced-fat cheeses. Nonfat, low-sodium ricotta or cottage cheese. Low-fat or nonfat yogurt. Low-fat, low-sodium cheese. Fats and oils Soft margarine without trans fats. Vegetable oil. Low-fat, reduced-fat, or light mayonnaise and salad dressings (reduced-sodium). Canola, safflower, olive, soybean, and sunflower oils. Avocado. Seasoning and other foods Herbs. Spices. Seasoning mixes without  salt. Unsalted popcorn and pretzels. Fat-free sweets. What foods are not recommended? The items listed may not be a complete list. Talk with your dietitian about what dietary choices are best for you. Grains Baked goods made with fat, such as croissants, muffins, or some breads. Dry pasta or rice meal packs. Vegetables Creamed or fried vegetables. Vegetables in a cheese sauce.  Regular canned vegetables (not low-sodium or reduced-sodium). Regular canned tomato sauce and paste (not low-sodium or reduced-sodium). Regular tomato and vegetable juice (not low-sodium or reduced-sodium). Angie Fava. Olives. Fruits Canned fruit in a light or heavy syrup. Fried fruit. Fruit in cream or butter sauce. Meat and other protein foods Fatty cuts of meat. Ribs. Fried meat. Berniece Salines. Sausage. Bologna and other processed lunch meats. Salami. Fatback. Hotdogs. Bratwurst. Salted nuts and seeds. Canned beans with added salt. Canned or smoked fish. Whole eggs or egg yolks. Chicken or [place] with skin. Dairy Whole or 2% milk, cream, and half-and-half. Whole or full-fat cream cheese. Whole-fat or sweetened yogurt. Full-fat cheese. Nondairy creamers. Whipped toppings. Processed cheese and cheese spreads. Fats and oils Butter. Stick margarine. Lard. Shortening. Ghee. Bacon fat. Tropical oils, such as coconut, palm kernel, or palm oil. Seasoning and other foods Salted popcorn and pretzels. Onion salt, garlic salt, seasoned salt, table salt, and sea salt. Worcestershire sauce. Tartar sauce. Barbecue sauce. Teriyaki sauce. Soy sauce, including reduced-sodium. Steak sauce. Canned and packaged gravies. Fish sauce. Oyster sauce. Cocktail sauce. Horseradish that you find on the shelf. Ketchup. Mustard. Meat flavorings and tenderizers. Bouillon cubes. Hot sauce and Tabasco sauce. Premade or packaged marinades. Premade or packaged taco seasonings. Relishes. Regular salad dressings. Where to find more information:  National Heart, Lung, and [place]: https://wilson-eaton.com/  American Heart Association: www.heart.org Summary  The DASH eating plan is a healthy eating plan that has been shown to reduce high blood pressure (hypertension). It may also reduce your risk for type 2 diabetes, heart disease, and stroke.  With the DASH eating plan, you should limit salt (sodium) intake to 2,300 mg a day. If you have  hypertension, you may need to reduce your sodium intake to 1,500 mg a day.  When on the DASH eating plan, aim to eat more fresh fruits and vegetables, whole grains, lean proteins, low-fat dairy, and heart-healthy fats.  Work with your health care provider or diet and nutrition specialist (dietitian) to adjust your eating plan to your individual calorie needs. This information is not intended to replace advice given to you by your health care provider. Make sure you discuss any questions you have with your health care provider. Document Released: 08/25/2011 Document Revised: 08/29/2016 Document Reviewed: 08/29/2016 Elsevier Interactive Patient Education  2018 [place], Daune Perch, NP  06/27/2018 6:05 PM    [facility]

## 2018-06-27 NOTE — Patient Instructions (Signed)
Medication Instructions:  Your physician recommends that you continue on your current medications as directed. Please refer to the Current Medication list given to you today.  If you need a refill on your cardiac medications before your next appointment, please call your pharmacy.   Lab work: None Ordered  If you have labs (blood work) drawn today and your tests are completely normal, you will receive your results only by: Marland Kitchen MyChart Message (if you have MyChart) OR . A paper copy in the mail If you have any lab test that is abnormal or we need to change your treatment, we will call you to review the results.  Testing/Procedures: None Ordered  Follow-Up: At [facility], you and your health needs are our priority.  As part of our continuing mission to provide you with exceptional heart care, we have created designated Provider Care Teams.  These Care Teams include your primary Cardiologist (physician) and Advanced Practice Providers (APPs -  Physician Assistants and Nurse Practitioners) who all work together to provide you with the care you need, when you need it. You will need a follow up appointment in:  12 months.  Please call our office 2 months in advance to schedule this appointment.  You may see Sherren Mocha, MD or one of the following Advanced Practice Providers on your designated Care Team: Richardson Dopp, PA-C [place], [place] . Daune Perch, NP  Any Other Special Instructions Will Be Listed Below (If Applicable).   DASH Eating Plan DASH stands for "Dietary Approaches to Stop Hypertension." The DASH eating plan is a healthy eating plan that has been shown to reduce high blood pressure (hypertension). It may also reduce your risk for type 2 diabetes, heart disease, and stroke. The DASH eating plan may also help with weight loss. What are tips for following this plan? General guidelines  Avoid eating more than 2,300 mg (milligrams) of salt (sodium) a day. If you have  hypertension, you may need to reduce your sodium intake to 1,500 mg a day.  Limit alcohol intake to no more than 1 drink a day for nonpregnant women and 2 drinks a day for men. One drink equals 12 oz of beer, 5 oz of wine, or 1 oz of hard liquor.  Work with your health care provider to maintain a healthy body weight or to lose weight. Ask what an ideal weight is for you.  Get at least 30 minutes of exercise that causes your heart to beat faster (aerobic exercise) most days of the week. Activities may include walking, swimming, or biking.  Work with your health care provider or diet and nutrition specialist (dietitian) to adjust your eating plan to your individual calorie needs. Reading food labels  Check food labels for the amount of sodium per serving. Choose foods with less than 5 percent of the Daily Value of sodium. Generally, foods with less than 300 mg of sodium per serving fit into this eating plan.  To find whole grains, look for the word "whole" as the first word in the ingredient list. Shopping  Buy products labeled as "low-sodium" or "no salt added."  Buy fresh foods. Avoid canned foods and premade or frozen meals. Cooking  Avoid adding salt when cooking. Use salt-free seasonings or herbs instead of table salt or sea salt. Check with your health care provider or pharmacist before using salt substitutes.  Do not fry foods. Cook foods using healthy methods such as baking, boiling, grilling, and broiling instead.  Cook with heart-healthy  oils, such as olive, canola, soybean, or sunflower oil. Meal planning   Eat a balanced diet that includes: ? 5 or more servings of fruits and vegetables each day. At each meal, try to fill half of your plate with fruits and vegetables. ? Up to 6-8 servings of whole grains each day. ? Less than 6 oz of lean meat, poultry, or fish each day. A 3-oz serving of meat is about the same size as a deck of cards. One egg equals 1 oz. ? 2 servings of  low-fat dairy each day. ? A serving of nuts, seeds, or beans 5 times each week. ? Heart-healthy fats. Healthy fats called Omega-3 fatty acids are found in foods such as flaxseeds and coldwater fish, like sardines, salmon, and mackerel.  Limit how much you eat of the following: ? Canned or prepackaged foods. ? Food that is high in trans fat, such as fried foods. ? Food that is high in saturated fat, such as fatty meat. ? Sweets, desserts, sugary drinks, and other foods with added sugar. ? Full-fat dairy products.  Do not salt foods before eating.  Try to eat at least 2 vegetarian meals each week.  Eat more home-cooked food and less restaurant, buffet, and fast food.  When eating at a restaurant, ask that your food be prepared with less salt or no salt, if possible. What foods are recommended? The items listed may not be a complete list. Talk with your dietitian about what dietary choices are best for you. Grains Whole-grain or whole-wheat bread. Whole-grain or whole-wheat pasta. Brown rice. [place] Morrow. Bulgur. Whole-grain and low-sodium cereals. Pita bread. Low-fat, low-sodium crackers. Whole-wheat flour tortillas. Vegetables Fresh or frozen vegetables (raw, steamed, roasted, or grilled). Low-sodium or reduced-sodium tomato and vegetable juice. Low-sodium or reduced-sodium tomato sauce and tomato paste. Low-sodium or reduced-sodium canned vegetables. Fruits All fresh, dried, or frozen fruit. Canned fruit in natural juice (without added sugar). Meat and other protein foods Skinless chicken or [place]. Ground chicken or [place]. Pork with fat trimmed off. Fish and seafood. Egg whites. Dried beans, peas, or lentils. Unsalted nuts, nut butters, and seeds. Unsalted canned beans. Lean cuts of beef with fat trimmed off. Low-sodium, lean deli meat. Dairy Low-fat (1%) or fat-free (skim) milk. Fat-free, low-fat, or reduced-fat cheeses. Nonfat, low-sodium ricotta or cottage cheese. Low-fat or  nonfat yogurt. Low-fat, low-sodium cheese. Fats and oils Soft margarine without trans fats. Vegetable oil. Low-fat, reduced-fat, or light mayonnaise and salad dressings (reduced-sodium). Canola, safflower, olive, soybean, and sunflower oils. Avocado. Seasoning and other foods Herbs. Spices. Seasoning mixes without salt. Unsalted popcorn and pretzels. Fat-free sweets. What foods are not recommended? The items listed may not be a complete list. Talk with your dietitian about what dietary choices are best for you. Grains Baked goods made with fat, such as croissants, muffins, or some breads. Dry pasta or rice meal packs. Vegetables Creamed or fried vegetables. Vegetables in a cheese sauce. Regular canned vegetables (not low-sodium or reduced-sodium). Regular canned tomato sauce and paste (not low-sodium or reduced-sodium). Regular tomato and vegetable juice (not low-sodium or reduced-sodium). Angie Fava. Olives. Fruits Canned fruit in a light or heavy syrup. Fried fruit. Fruit in cream or butter sauce. Meat and other protein foods Fatty cuts of meat. Ribs. Fried meat. Berniece Salines. Sausage. Bologna and other processed lunch meats. Salami. Fatback. Hotdogs. Bratwurst. Salted nuts and seeds. Canned beans with added salt. Canned or smoked fish. Whole eggs or egg yolks. Chicken or [place] with skin. Dairy Whole or 2%  milk, cream, and half-and-half. Whole or full-fat cream cheese. Whole-fat or sweetened yogurt. Full-fat cheese. Nondairy creamers. Whipped toppings. Processed cheese and cheese spreads. Fats and oils Butter. Stick margarine. Lard. Shortening. Ghee. Bacon fat. Tropical oils, such as coconut, palm kernel, or palm oil. Seasoning and other foods Salted popcorn and pretzels. Onion salt, garlic salt, seasoned salt, table salt, and sea salt. Worcestershire sauce. Tartar sauce. Barbecue sauce. Teriyaki sauce. Soy sauce, including reduced-sodium. Steak sauce. Canned and packaged gravies. Fish sauce. Oyster  sauce. Cocktail sauce. Horseradish that you find on the shelf. Ketchup. Mustard. Meat flavorings and tenderizers. Bouillon cubes. Hot sauce and Tabasco sauce. Premade or packaged marinades. Premade or packaged taco seasonings. Relishes. Regular salad dressings. Where to find more information:  National Heart, Lung, and [place]: https://wilson-eaton.com/  American Heart Association: www.heart.org Summary  The DASH eating plan is a healthy eating plan that has been shown to reduce high blood pressure (hypertension). It may also reduce your risk for type 2 diabetes, heart disease, and stroke.  With the DASH eating plan, you should limit salt (sodium) intake to 2,300 mg a day. If you have hypertension, you may need to reduce your sodium intake to 1,500 mg a day.  When on the DASH eating plan, aim to eat more fresh fruits and vegetables, whole grains, lean proteins, low-fat dairy, and heart-healthy fats.  Work with your health care provider or diet and nutrition specialist (dietitian) to adjust your eating plan to your individual calorie needs. This information is not intended to replace advice given to you by your health care provider. Make sure you discuss any questions you have with your health care provider. Document Released: 08/25/2011 Document Revised: 08/29/2016 Document Reviewed: 08/29/2016 Elsevier Interactive Patient Education  [employer].

## 2018-07-18 ENCOUNTER — Other Ambulatory Visit: Payer: Self-pay | Admitting: Internal Medicine

## 2018-08-15 ENCOUNTER — Other Ambulatory Visit: Payer: Self-pay | Admitting: Cardiovascular Disease

## 2018-08-29 ENCOUNTER — Ambulatory Visit (INDEPENDENT_AMBULATORY_CARE_PROVIDER_SITE_OTHER): Payer: Medicare Other

## 2018-08-29 DIAGNOSIS — I255 Ischemic cardiomyopathy: Secondary | ICD-10-CM

## 2018-08-29 DIAGNOSIS — I5022 Chronic systolic (congestive) heart failure: Secondary | ICD-10-CM

## 2018-08-30 NOTE — Progress Notes (Signed)
Remote ICD transmission.   

## 2018-08-31 ENCOUNTER — Encounter: Payer: Self-pay | Admitting: Cardiology

## 2018-10-11 ENCOUNTER — Other Ambulatory Visit: Payer: Self-pay | Admitting: Cardiovascular Disease

## 2018-10-13 LAB — CUP PACEART REMOTE DEVICE CHECK
Date Time Interrogation Session: 20191211113629
HighPow Impedance: 72 Ohm
Implantable Lead Implant Date: 20110110
Implantable Lead Location: 753860
Implantable Lead Model: 6935
Lead Channel Impedance Value: 475 Ohm
Lead Channel Pacing Threshold Amplitude: 0.75 V
Lead Channel Pacing Threshold Pulse Width: 0.4 ms
Lead Channel Sensing Intrinsic Amplitude: 16.875 mV
Lead Channel Setting Pacing Amplitude: 2.5 V
MDC IDC MSMT BATTERY REMAINING LONGEVITY: 126 mo
MDC IDC MSMT BATTERY VOLTAGE: 3.01 V
MDC IDC MSMT LEADCHNL RV IMPEDANCE VALUE: 513 Ohm
MDC IDC MSMT LEADCHNL RV SENSING INTR AMPL: 16.875 mV
MDC IDC PG IMPLANT DT: 20190301
MDC IDC SET LEADCHNL RV PACING PULSEWIDTH: 0.4 ms
MDC IDC SET LEADCHNL RV SENSING SENSITIVITY: 0.3 mV
MDC IDC STAT BRADY RV PERCENT PACED: 0.01 %

## 2018-11-19 ENCOUNTER — Encounter: Payer: Medicare Other | Admitting: Physician Assistant

## 2018-11-19 ENCOUNTER — Ambulatory Visit: Payer: Medicare Other | Admitting: Internal Medicine

## 2018-11-19 ENCOUNTER — Encounter: Payer: Self-pay | Admitting: Internal Medicine

## 2018-11-19 VITALS — BP 110/66 | HR 63 | Ht 64.0 in | Wt 157.2 lb

## 2018-11-19 DIAGNOSIS — I5022 Chronic systolic (congestive) heart failure: Secondary | ICD-10-CM

## 2018-11-19 DIAGNOSIS — I472 Ventricular tachycardia, unspecified: Secondary | ICD-10-CM

## 2018-11-19 DIAGNOSIS — Z95810 Presence of automatic (implantable) cardiac defibrillator: Secondary | ICD-10-CM

## 2018-11-19 DIAGNOSIS — I255 Ischemic cardiomyopathy: Secondary | ICD-10-CM | POA: Diagnosis not present

## 2018-11-19 DIAGNOSIS — I471 Supraventricular tachycardia: Secondary | ICD-10-CM

## 2018-11-19 NOTE — Progress Notes (Signed)
Patient Care Team: Redmond School, MD as PCP - General (Internal Medicine) Sherren Mocha, MD as PCP - Cardiology (Cardiology)   HPI  [name] [name] is a 63 y.o. female  seen in followup for an ICD implanted for primary prevention OCT 2011 . She is a 63 year-old woman s/p anterior MI 2010, who underwent primary PCI at that time. She had persisent severe LV dysfunction she underwent device generator replacement 3/19  Most recent echo 2/14 demonstrated EF 35-40%    Appropriate Therapy no Inappropriate Therapy yes   Date Cr K  2/19 1.26 4.1          Date Cr K Hgb  2/19 1.26 4.1 11.6         The patient denies chest pain, shortness of breath, nocturnal dyspnea, orthopnea or peripheral edema.  There have been no palpitations, lightheadedness or syncope.    Past Medical History:  Diagnosis Date  . Cervical disc disease   . Depression   . Dyslipidemia   . GERD (gastroesophageal reflux disease)   . ICD (implantable cardiac defibrillator), single MDT    treated with BMS to LAD  . Ischemic cardiomyopathy    post-MI LVEF less than 30%; acute AMI Rx w BMS>>LAD    Past Surgical History:  Procedure Laterality Date  . CERVICAL DISC SURGERY    . CORONARY ANGIOPLASTY WITH STENT PLACEMENT    . ICD GENERATOR CHANGEOUT N/A 11/17/2017   Procedure: ICD GENERATOR CHANGEOUT;  Surgeon: Deboraha Sprang, MD;  Location: [facility];  Service: Cardiovascular;  Laterality: N/A;  . icd implanted    . partial right masectomy      Current Outpatient Medications  Medication Sig Dispense Refill  . acetaminophen (TYLENOL) 325 MG tablet Take 650 mg by mouth every 6 (six) hours as needed (FOR PAIN.).     Marland Kitchen aspirin EC 81 MG EC tablet Take 1 tablet (81 mg total) by mouth daily. 30 tablet 6  . carvedilol (COREG) 12.5 MG tablet TAKE 1 TABLET(12.5 MG) BY MOUTH TWICE DAILY 180 tablet 3  . DULoxetine (CYMBALTA) 60 MG capsule Take 60 mg by mouth daily.   3  . furosemide (LASIX) 20 MG tablet  TAKE 1 TABLET BY MOUTH EVERY OTHER DAY 45 tablet 2  . losartan (COZAAR) 50 MG tablet Take 1 tablet (50 mg total) by mouth daily. 30 tablet 11  . nitroGLYCERIN (NITROSTAT) 0.4 MG SL tablet Place 0.4 mg under the tongue every 5 (five) minutes x 3 doses as needed for chest pain.     . pantoprazole (PROTONIX) 40 MG tablet TAKE 1 TABLET BY MOUTH EVERY DAY 90 tablet 3  . potassium chloride SA (K-DUR,KLOR-CON) 20 MEQ tablet TAKE 2 TABLETS BY MOUTH EVERY DAY 180 tablet 3  . rosuvastatin (CRESTOR) 40 MG tablet TAKE 1 TABLET BY MOUTH EVERY DAY 30 tablet 9   No current facility-administered medications for this visit.     No Known Allergies  Review of Systems negative except from HPI and PMH  Physical Exam BP 110/66   Pulse 63   Ht 5\' 4"  (1.626 m)   Wt 157 lb 3.2 oz (71.3 kg)   SpO2 92%   BMI 26.98 kg/m   Well developed and well nourished in no acute distress HENT normal Neck supple with JVP-flat Clear Device pocket well healed; without hematoma or erythema.  There is no tethering  Regular rate and rhythm, no murmur Abd-soft with active BS No Clubbing cyanosis  edema Skin-warm and dry A & Oriented  Grossly normal sensory and motor function   ECG demonstrates sinus at 63 Interval 16/09/39 Septal MI  Assessment and  Plan  Ischemic Cardiomyopathy     CHF systolic chronic    VT  SVT  ICD Medtronic  The patient's device was interrogated.  The information was reviewed. No changes were made in the programming.   .      Without symptoms of ischemia  No intercurrent Ventricular tachycardia

## 2018-11-19 NOTE — Patient Instructions (Signed)

## 2018-11-28 ENCOUNTER — Ambulatory Visit (INDEPENDENT_AMBULATORY_CARE_PROVIDER_SITE_OTHER): Payer: Medicare Other | Admitting: *Deleted

## 2018-11-28 ENCOUNTER — Other Ambulatory Visit: Payer: Self-pay

## 2018-11-28 DIAGNOSIS — I255 Ischemic cardiomyopathy: Secondary | ICD-10-CM

## 2018-11-28 DIAGNOSIS — I5022 Chronic systolic (congestive) heart failure: Secondary | ICD-10-CM

## 2018-11-28 LAB — CUP PACEART INCLINIC DEVICE CHECK
Battery Voltage: 3.01 V
Brady Statistic RV Percent Paced: 0.01 %
HighPow Impedance: 84 Ohm
Implantable Lead Implant Date: 20110110
Implantable Lead Location: 753860
Implantable Lead Model: 6935
Lead Channel Impedance Value: 475 Ohm
Lead Channel Impedance Value: 551 Ohm
Lead Channel Pacing Threshold Pulse Width: 0.4 ms
Lead Channel Sensing Intrinsic Amplitude: 20.375 mV
Lead Channel Setting Pacing Amplitude: 2.5 V
Lead Channel Setting Pacing Pulse Width: 0.4 ms
MDC IDC MSMT BATTERY REMAINING LONGEVITY: 125 mo
MDC IDC MSMT LEADCHNL RV PACING THRESHOLD AMPLITUDE: 0.625 V
MDC IDC PG IMPLANT DT: 20190301
MDC IDC SESS DTM: 20200303032436
MDC IDC SET LEADCHNL RV SENSING SENSITIVITY: 0.3 mV

## 2018-11-29 LAB — CUP PACEART REMOTE DEVICE CHECK
Battery Remaining Longevity: 124 mo
Battery Voltage: 3.01 V
Brady Statistic RV Percent Paced: 0.01 %
HighPow Impedance: 72 Ohm
Implantable Lead Implant Date: 20110110
Implantable Lead Location: 753860
Implantable Lead Model: 6935
Lead Channel Impedance Value: 551 Ohm
Lead Channel Pacing Threshold Pulse Width: 0.4 ms
Lead Channel Sensing Intrinsic Amplitude: 16.875 mV
Lead Channel Setting Pacing Amplitude: 2.5 V
Lead Channel Setting Pacing Pulse Width: 0.4 ms
MDC IDC MSMT LEADCHNL RV IMPEDANCE VALUE: 494 Ohm
MDC IDC MSMT LEADCHNL RV PACING THRESHOLD AMPLITUDE: 0.75 V
MDC IDC MSMT LEADCHNL RV SENSING INTR AMPL: 16.875 mV
MDC IDC PG IMPLANT DT: 20190301
MDC IDC SESS DTM: 20200311062824
MDC IDC SET LEADCHNL RV SENSING SENSITIVITY: 0.3 mV

## 2018-12-05 NOTE — Progress Notes (Signed)
Remote ICD transmission.   

## 2019-01-30 ENCOUNTER — Other Ambulatory Visit: Payer: Self-pay | Admitting: Cardiovascular Disease

## 2019-02-27 ENCOUNTER — Ambulatory Visit (INDEPENDENT_AMBULATORY_CARE_PROVIDER_SITE_OTHER): Payer: Medicare Other | Admitting: *Deleted

## 2019-02-27 DIAGNOSIS — I472 Ventricular tachycardia, unspecified: Secondary | ICD-10-CM

## 2019-02-27 LAB — CUP PACEART REMOTE DEVICE CHECK
Battery Remaining Longevity: 122 mo
Battery Voltage: 3.01 V
Brady Statistic RV Percent Paced: 0.02 %
Date Time Interrogation Session: 20200610102838
HighPow Impedance: 74 Ohm
Implantable Lead Implant Date: 20110110
Implantable Lead Location: 753860
Implantable Lead Model: 6935
Implantable Pulse Generator Implant Date: 20190301
Lead Channel Impedance Value: 475 Ohm
Lead Channel Impedance Value: 551 Ohm
Lead Channel Pacing Threshold Amplitude: 0.75 V
Lead Channel Pacing Threshold Pulse Width: 0.4 ms
Lead Channel Sensing Intrinsic Amplitude: 20.75 mV
Lead Channel Sensing Intrinsic Amplitude: 20.75 mV
Lead Channel Setting Pacing Amplitude: 2.5 V
Lead Channel Setting Pacing Pulse Width: 0.4 ms
Lead Channel Setting Sensing Sensitivity: 0.3 mV

## 2019-03-08 ENCOUNTER — Encounter: Payer: Self-pay | Admitting: Cardiology

## 2019-03-08 NOTE — Progress Notes (Signed)
Remote ICD transmission.   

## 2019-03-09 ENCOUNTER — Other Ambulatory Visit: Payer: Self-pay | Admitting: Internal Medicine

## 2019-03-18 ENCOUNTER — Other Ambulatory Visit: Payer: Self-pay | Admitting: Cardiovascular Disease

## 2019-05-29 ENCOUNTER — Ambulatory Visit (INDEPENDENT_AMBULATORY_CARE_PROVIDER_SITE_OTHER): Payer: Medicare Other | Admitting: *Deleted

## 2019-05-29 DIAGNOSIS — I471 Supraventricular tachycardia: Secondary | ICD-10-CM

## 2019-05-29 DIAGNOSIS — I255 Ischemic cardiomyopathy: Secondary | ICD-10-CM

## 2019-05-29 LAB — CUP PACEART REMOTE DEVICE CHECK
Battery Remaining Longevity: 121 mo
Battery Voltage: 2.99 V
Brady Statistic RV Percent Paced: 0.02 %
Date Time Interrogation Session: 20200909094329
HighPow Impedance: 86 Ohm
Implantable Lead Implant Date: 20110110
Implantable Lead Location: 753860
Implantable Lead Model: 6935
Implantable Pulse Generator Implant Date: 20190301
Lead Channel Impedance Value: 494 Ohm
Lead Channel Impedance Value: 570 Ohm
Lead Channel Pacing Threshold Amplitude: 0.75 V
Lead Channel Pacing Threshold Pulse Width: 0.4 ms
Lead Channel Sensing Intrinsic Amplitude: 16.5 mV
Lead Channel Sensing Intrinsic Amplitude: 16.5 mV
Lead Channel Setting Pacing Amplitude: 2.5 V
Lead Channel Setting Pacing Pulse Width: 0.4 ms
Lead Channel Setting Sensing Sensitivity: 0.3 mV

## 2019-06-12 NOTE — Progress Notes (Signed)
Remote ICD transmission.   

## 2019-06-18 ENCOUNTER — Other Ambulatory Visit: Payer: Self-pay | Admitting: Cardiovascular Disease

## 2019-07-01 ENCOUNTER — Ambulatory Visit: Payer: Medicare Other | Admitting: Physician Assistant

## 2019-07-24 ENCOUNTER — Encounter: Payer: Self-pay | Admitting: Cardiovascular Disease

## 2019-07-24 ENCOUNTER — Other Ambulatory Visit: Payer: Self-pay

## 2019-07-24 ENCOUNTER — Ambulatory Visit: Payer: Medicare Other | Admitting: Cardiovascular Disease

## 2019-07-24 VITALS — BP 110/52 | HR 66 | Ht 64.0 in | Wt 156.8 lb

## 2019-07-24 DIAGNOSIS — I2510 Atherosclerotic heart disease of native coronary artery without angina pectoris: Secondary | ICD-10-CM

## 2019-07-24 DIAGNOSIS — I5022 Chronic systolic (congestive) heart failure: Secondary | ICD-10-CM | POA: Diagnosis not present

## 2019-07-24 MED ORDER — PANTOPRAZOLE SODIUM 40 MG PO TBEC: 40.0000 mg | DELAYED_RELEASE_TABLET | Freq: Every day | ORAL | 3 refills | Status: DC

## 2019-07-24 NOTE — Progress Notes (Signed)
Cardiology Office Note:    Date:  07/24/2019   ID:  [name] [name], DOB 1956/08/15, MRN [id number]  PCP:  Redmond School, MD  Cardiologist:  Sherren Mocha, MD  Electrophysiologist:  None   Referring MD: Redmond School, MD   Chief Complaint  Patient presents with  . Coronary Artery Disease    History of Present Illness:    [name] [name] is a 63 y.o. female with a hx of  CAD s/p anterior MI 2010 with PCI to LAD, ICM, S/P ICD last gen change 11/2017. She had several inappropriate ICD discharges in 09/2015, settings were adjusted with no further discharges.   She's here alone today. She continues to do well. Today, she denies symptoms of palpitations, chest pain, shortness of breath, orthopnea, PND, lower extremity edema, dizziness, or syncope. She walks regularly for exercise and is active with her grandchildren.  Past Medical History:  Diagnosis Date  . Cervical disc disease   . Depression   . Dyslipidemia   . GERD (gastroesophageal reflux disease)   . ICD (implantable cardiac defibrillator), single MDT    treated with BMS to LAD  . Ischemic cardiomyopathy    post-MI LVEF less than 30%; acute AMI Rx w BMS>>LAD    Past Surgical History:  Procedure Laterality Date  . CERVICAL DISC SURGERY    . CORONARY ANGIOPLASTY WITH STENT PLACEMENT    . ICD GENERATOR CHANGEOUT N/A 11/17/2017   Procedure: ICD GENERATOR CHANGEOUT;  Surgeon: Deboraha Sprang, MD;  Location: [facility];  Service: Cardiovascular;  Laterality: N/A;  . icd implanted    . partial right masectomy      Current Medications: Current Meds  Medication Sig  . acetaminophen (TYLENOL) 325 MG tablet Take 650 mg by mouth every 6 (six) hours as needed (FOR PAIN.).   Marland Kitchen aspirin EC 81 MG EC tablet Take 1 tablet (81 mg total) by mouth daily.  . carvedilol (COREG) 12.5 MG tablet TAKE 1 TABLET(12.5 MG) BY MOUTH TWICE DAILY  . DULoxetine (CYMBALTA) 60 MG capsule Take 60 mg by mouth daily.   . furosemide (LASIX) 20 MG tablet  TAKE 1 TABLET BY MOUTH EVERY OTHER DAY  . losartan (COZAAR) 50 MG tablet Take 1 tablet (50 mg total) by mouth daily.  . nitroGLYCERIN (NITROSTAT) 0.4 MG SL tablet Place 0.4 mg under the tongue every 5 (five) minutes x 3 doses as needed for chest pain.   . pantoprazole (PROTONIX) 40 MG tablet Take 1 tablet (40 mg total) by mouth daily.  . potassium chloride SA (K-DUR) 20 MEQ tablet Take 2 tablets (40 mEq total) by mouth daily.  . rosuvastatin (CRESTOR) 40 MG tablet TAKE 1 TABLET BY MOUTH EVERY DAY  . [DISCONTINUED] pantoprazole (PROTONIX) 40 MG tablet TAKE 1 TABLET BY MOUTH EVERY DAY     Allergies:   Patient has no known allergies.   Social History   Socioeconomic History  . Marital status: Divorced    Spouse name: Not on file  . Number of children: Not on file  . Years of education: Not on file  . Highest education level: Not on file  Occupational History  . Not on file  Social Needs  . Financial resource strain: Not on file  . Food insecurity    Worry: Not on file    Inability: Not on file  . Transportation needs    Medical: Not on file    Non-medical: Not on file  Tobacco Use  . Smoking status:  Former Smoker    Packs/day: 0.50    Years: 10.00    Pack years: 5.00  . Smokeless tobacco: Never Used  . Tobacco comment: smoked 1/2 ppd for at least 10 years-quit smoking at the time of her MI  Substance and Sexual Activity  . Alcohol use: No  . Drug use: No  . Sexual activity: Not on file  Lifestyle  . Physical activity    Days per week: Not on file    Minutes per session: Not on file  . Stress: Not on file  Relationships  . Social Herbalist on phone: Not on file    Gets together: Not on file    Attends religious service: Not on file    Active member of club or organization: Not on file    Attends meetings of clubs or organizations: Not on file    Relationship status: Not on file  Other Topics Concern  . Not on file  Social History Narrative  . Not on file      Family History: The patient's family history includes Heart attack in her mother.  ROS:   Please see the history of present illness.    All other systems reviewed and are negative.  EKGs/Labs/Other Studies Reviewed:    EKG:  EKG is not ordered today.    Recent Labs: No results found for requested labs within last 8760 hours.  Recent Lipid Panel    Component Value Date/Time   CHOL 114 (L) 02/24/2016 1041   TRIG 146 02/24/2016 1041   HDL 35 (L) 02/24/2016 1041   CHOLHDL 3.3 02/24/2016 1041   VLDL 29 02/24/2016 1041   LDLCALC 50 02/24/2016 1041   LDLDIRECT 70.8 10/27/2010 1020    Physical Exam:    VS:  BP (!) 110/52   Pulse 66   Ht 5\' 4"  (1.626 m)   Wt 156 lb 12.8 oz (71.1 kg)   SpO2 98%   BMI 26.91 kg/m     Wt Readings from Last 3 Encounters:  07/24/19 156 lb 12.8 oz (71.1 kg)  11/19/18 157 lb 3.2 oz (71.3 kg)  06/27/18 158 lb 8 oz (71.9 kg)     GEN:  Well nourished, well developed in no acute distress HEENT: Normal NECK: No JVD; No carotid bruits LYMPHATICS: No lymphadenopathy CARDIAC: RRR, no murmurs, rubs, gallops RESPIRATORY:  Clear to auscultation without rales, wheezing or rhonchi  ABDOMEN: Soft, non-tender, non-distended MUSCULOSKELETAL:  No edema; No deformity  SKIN: Warm and dry NEUROLOGIC:  Alert and oriented x 3 PSYCHIATRIC:  Normal affect   ASSESSMENT:    1. SYSTOLIC HEART FAILURE, CHRONIC   2. Coronary artery disease involving native coronary artery of native heart without angina pectoris    PLAN:    In order of problems listed above:  1. Continues to have NYHA functional Class 1 symptoms on a stable medical program of losartan and carvedilol. Last echo was in 2014 - will update to evaluate LV function and presence of valvular disease that might be related to her ischemic cardiomyopathy. Do not anticipate any changes in her medical regimen if echo findings stable. 2. No anginal sx's. Continue ASA, beta blocker, statin-drug  (high-intensity).   Medication Adjustments/Labs and Tests Ordered: Current medicines are reviewed at length with the patient today.  Concerns regarding medicines are outlined above.  Orders Placed This Encounter  Procedures  . ECHOCARDIOGRAM COMPLETE   Meds ordered this encounter  Medications  . pantoprazole (PROTONIX) 40 MG tablet  Sig: Take 1 tablet (40 mg total) by mouth daily.    Dispense:  90 tablet    Refill:  3    There are no Patient Instructions on file for this visit.   Signed, Sherren Mocha, MD  07/24/2019 8:50 AM    [place]

## 2019-07-24 NOTE — Patient Instructions (Signed)
Medication Instructions:  Your provider recommends that you continue on your current medications as directed. Please refer to the Current Medication list given to you today.   *If you need a refill on your cardiac medications before your next appointment, please call your pharmacy*  Testing/Procedures: Your provider has requested that you have an echocardiogram. Echocardiography is a painless test that uses sound waves to create images of your heart. It provides your doctor with information about the size and shape of your heart and how well your heart's chambers and valves are working. This procedure takes approximately one hour. There are no restrictions for this procedure.  Follow-Up: At [facility], you and your health needs are our priority.  As part of our continuing mission to provide you with exceptional heart care, we have created designated Provider Care Teams.  These Care Teams include your primary Cardiologist (physician) and Advanced Practice Providers (APPs -  Physician Assistants and Nurse Practitioners) who all work together to provide you with the care you need, when you need it. Your next appointment:   12 month(s) The format for your next appointment:   In Person Provider:   You may see Michael Cooper, MD or one of the following Advanced Practice Providers on your designated Care Team:    Scott Weaver, PA-C  Vin Bhagat, PA-C  Janine Hammond, NP 

## 2019-07-29 ENCOUNTER — Ambulatory Visit: Payer: Medicare Other | Admitting: Physician Assistant

## 2019-07-31 ENCOUNTER — Other Ambulatory Visit: Payer: Self-pay

## 2019-07-31 ENCOUNTER — Ambulatory Visit (HOSPITAL_COMMUNITY): Payer: Medicare Other | Attending: Cardiovascular Disease

## 2019-07-31 DIAGNOSIS — I5022 Chronic systolic (congestive) heart failure: Secondary | ICD-10-CM | POA: Diagnosis present

## 2019-08-10 ENCOUNTER — Other Ambulatory Visit: Payer: Self-pay | Admitting: Cardiovascular Disease

## 2019-08-12 ENCOUNTER — Other Ambulatory Visit: Payer: Self-pay | Admitting: Cardiovascular Disease

## 2019-08-12 MED ORDER — ROSUVASTATIN CALCIUM 40 MG PO TABS: 40.0000 mg | ORAL_TABLET | Freq: Every day | ORAL | 11 refills | Status: DC

## 2019-08-12 MED ORDER — LOSARTAN POTASSIUM 50 MG PO TABS: 50.0000 mg | ORAL_TABLET | Freq: Every day | ORAL | 11 refills | Status: DC

## 2019-08-28 ENCOUNTER — Ambulatory Visit (INDEPENDENT_AMBULATORY_CARE_PROVIDER_SITE_OTHER): Payer: Medicare Other | Admitting: *Deleted

## 2019-08-28 DIAGNOSIS — I5022 Chronic systolic (congestive) heart failure: Secondary | ICD-10-CM

## 2019-08-28 LAB — CUP PACEART REMOTE DEVICE CHECK
Battery Remaining Longevity: 119 mo
Battery Voltage: 3.01 V
Brady Statistic RV Percent Paced: 0.01 %
Date Time Interrogation Session: 20201209012507
HighPow Impedance: 82 Ohm
Implantable Lead Implant Date: 20110110
Implantable Lead Location: 753860
Implantable Lead Model: 6935
Implantable Pulse Generator Implant Date: 20190301
Lead Channel Impedance Value: 494 Ohm
Lead Channel Impedance Value: 570 Ohm
Lead Channel Pacing Threshold Amplitude: 0.625 V
Lead Channel Pacing Threshold Pulse Width: 0.4 ms
Lead Channel Sensing Intrinsic Amplitude: 17.125 mV
Lead Channel Sensing Intrinsic Amplitude: 17.125 mV
Lead Channel Setting Pacing Amplitude: 2.5 V
Lead Channel Setting Pacing Pulse Width: 0.4 ms
Lead Channel Setting Sensing Sensitivity: 0.3 mV

## 2019-09-14 ENCOUNTER — Other Ambulatory Visit: Payer: Self-pay | Admitting: Cardiovascular Disease

## 2019-09-19 ENCOUNTER — Encounter

## 2019-10-05 NOTE — Progress Notes (Signed)
ICD remote 

## 2019-11-27 ENCOUNTER — Ambulatory Visit (INDEPENDENT_AMBULATORY_CARE_PROVIDER_SITE_OTHER): Payer: Medicare PPO | Admitting: *Deleted

## 2019-11-27 DIAGNOSIS — I5022 Chronic systolic (congestive) heart failure: Secondary | ICD-10-CM | POA: Diagnosis not present

## 2019-11-27 LAB — CUP PACEART REMOTE DEVICE CHECK
Battery Remaining Longevity: 116 mo
Battery Voltage: 3.01 V
Brady Statistic RV Percent Paced: 0.02 %
Date Time Interrogation Session: 20210310043725
HighPow Impedance: 72 Ohm
Implantable Lead Implant Date: 20110110
Implantable Lead Location: 753860
Implantable Lead Model: 6935
Implantable Pulse Generator Implant Date: 20190301
Lead Channel Impedance Value: 437 Ohm
Lead Channel Impedance Value: 513 Ohm
Lead Channel Pacing Threshold Amplitude: 0.75 V
Lead Channel Pacing Threshold Pulse Width: 0.4 ms
Lead Channel Sensing Intrinsic Amplitude: 16.875 mV
Lead Channel Sensing Intrinsic Amplitude: 16.875 mV
Lead Channel Setting Pacing Amplitude: 2.5 V
Lead Channel Setting Pacing Pulse Width: 0.4 ms
Lead Channel Setting Sensing Sensitivity: 0.3 mV

## 2019-11-27 NOTE — Progress Notes (Signed)
ICD Remote  

## 2019-12-01 DIAGNOSIS — I4729 Other ventricular tachycardia: Secondary | ICD-10-CM | POA: Insufficient documentation

## 2019-12-01 DIAGNOSIS — I472 Ventricular tachycardia, unspecified: Secondary | ICD-10-CM | POA: Insufficient documentation

## 2019-12-02 ENCOUNTER — Other Ambulatory Visit: Payer: Self-pay

## 2019-12-02 ENCOUNTER — Ambulatory Visit: Payer: Medicare Other | Admitting: Internal Medicine

## 2019-12-02 ENCOUNTER — Encounter: Payer: Self-pay | Admitting: Internal Medicine

## 2019-12-02 VITALS — BP 110/60 | HR 57 | Ht 60.0 in | Wt 157.0 lb

## 2019-12-02 DIAGNOSIS — I255 Ischemic cardiomyopathy: Secondary | ICD-10-CM

## 2019-12-02 DIAGNOSIS — I5022 Chronic systolic (congestive) heart failure: Secondary | ICD-10-CM

## 2019-12-02 DIAGNOSIS — Z95810 Presence of automatic (implantable) cardiac defibrillator: Secondary | ICD-10-CM

## 2019-12-02 DIAGNOSIS — I471 Supraventricular tachycardia: Secondary | ICD-10-CM | POA: Diagnosis not present

## 2019-12-02 DIAGNOSIS — I472 Ventricular tachycardia, unspecified: Secondary | ICD-10-CM

## 2019-12-02 NOTE — Patient Instructions (Addendum)
Medication Instructions:  Your physician recommends that you continue on your current medications as directed. Please refer to the Current Medication list given to you today.  Labwork: You will have labs drawn today:  CBC BMET Direct LDL     Testing/Procedures: None ordered.  Follow-Up: Your physician wants you to follow-up in: 12 months with Dr Gari Crown will receive a reminder letter in the mail two months in advance. If you don't receive a letter, please call our office to schedule the follow-up appointment.  Remote monitoring is used to monitor your Pacemaker of ICD from home. This monitoring reduces the number of office visits required to check your device to one time per year. It allows [place] to keep an eye on the functioning of your device to ensure it is working properly.   Any Other Special Instructions Will Be Listed Below (If Applicable).  If you need a refill on your cardiac medications before your next appointment, please call your pharmacy.

## 2019-12-02 NOTE — Progress Notes (Signed)
Patient Care Team: Redmond School, MD as PCP - General (Internal Medicine) Sherren Mocha, MD as PCP - Cardiology (Cardiology)   HPI  [name] [name] is a 64 y.o. female  seen in followup for an ICD implanted for primary prevention OCT 2011 . She is a 64 year-old woman s/p anterior MI 2010, who underwent primary PCI at that time. She had persisent severe LV dysfunction she underwent device generator replacement 3/19  The patient denies chest pain, shortness of breath, nocturnal dyspnea, orthopnea or peripheral edema.  There have been no palpitations, lightheadedness or syncope.     Appropriate Therapy no Inappropriate Therapy yes   DATE TEST EF   2/14 Echo   35-40 %   11/20 Echo   **60-65** % Images reviewed by [facility]-- prob stable moderate LV dysfn          Date Cr K Hgb  2/19 1.26 4.1 11.6             Past Medical History:  Diagnosis Date  . Cervical disc disease   . Depression   . Dyslipidemia   . GERD (gastroesophageal reflux disease)   . ICD (implantable cardiac defibrillator), single MDT    treated with BMS to LAD  . Ischemic cardiomyopathy    post-MI LVEF less than 30%; acute AMI Rx w BMS>>LAD    Past Surgical History:  Procedure Laterality Date  . CERVICAL DISC SURGERY    . CORONARY ANGIOPLASTY WITH STENT PLACEMENT    . ICD GENERATOR CHANGEOUT N/A 11/17/2017   Procedure: ICD GENERATOR CHANGEOUT;  Surgeon: Deboraha Sprang, MD;  Location: [facility];  Service: Cardiovascular;  Laterality: N/A;  . icd implanted    . partial right masectomy      Current Outpatient Medications  Medication Sig Dispense Refill  . acetaminophen (TYLENOL) 325 MG tablet Take 650 mg by mouth every 6 (six) hours as needed (FOR PAIN.).     Marland Kitchen aspirin EC 81 MG EC tablet Take 1 tablet (81 mg total) by mouth daily. 30 tablet 6  . carvedilol (COREG) 12.5 MG tablet TAKE 1 TABLET(12.5 MG) BY MOUTH TWICE DAILY 180 tablet 2  . DULoxetine (CYMBALTA) 60 MG capsule Take 60 mg by  mouth daily.   3  . furosemide (LASIX) 20 MG tablet TAKE 1 TABLET BY MOUTH EVERY OTHER DAY 90 tablet 1  . losartan (COZAAR) 50 MG tablet Take 1 tablet (50 mg total) by mouth daily. 30 tablet 11  . nitroGLYCERIN (NITROSTAT) 0.4 MG SL tablet Place 0.4 mg under the tongue every 5 (five) minutes x 3 doses as needed for chest pain.     . pantoprazole (PROTONIX) 40 MG tablet Take 1 tablet (40 mg total) by mouth daily. 90 tablet 3  . potassium chloride SA (KLOR-CON) 20 MEQ tablet TAKE 2 TABLETS(40 MEQ) BY MOUTH DAILY 180 tablet 3  . rosuvastatin (CRESTOR) 40 MG tablet Take 1 tablet (40 mg total) by mouth daily. 30 tablet 11   No current facility-administered medications for this visit.    No Known Allergies  Review of Systems negative except from HPI and PMH  Physical Exam BP 110/60   Pulse (!) 57   Ht 5' (1.524 m)   Wt 157 lb (71.2 kg)   HC 64" (162.6 cm)   SpO2 99%   BMI 30.66 kg/m  Well developed and well nourished in no acute distress HENT normal Neck supple with JVP-flat Clear Device pocket well healed; without  hematoma or erythema.  There is no tethering  Regular rate and rhythm, no  gallop No / murmur Abd-soft with active BS No Clubbing cyanosis  edema Skin-warm and dry A & Oriented  Grossly normal sensory and motor function  ECG sinus @ 57 16/09/42  Assessment and  Plan  Ischemic Cardiomyopathy     CHF systolic chronic    VT  SVT  ICD Medtronic The patient's device was interrogated.  The information was reviewed. No changes were made in the programming.      Without symptoms of ischemia  Euvolemic continue current meds  No intercurrent Ventricular tachycardia  Some sinus tach  Need to check lipids and renal function and hgb  Last checked almost 2 yrs ago ( and LDL 4 yr ago)

## 2019-12-03 LAB — BASIC METABOLIC PANEL
BUN/Creatinine Ratio: 7 — ABNORMAL LOW (ref 12–28)
BUN: 10 mg/dL (ref 8–27)
CO2: 19 mmol/L — ABNORMAL LOW (ref 20–29)
Calcium: 9.7 mg/dL (ref 8.7–10.3)
Chloride: 104 mmol/L (ref 96–106)
Creatinine, Ser: 1.5 mg/dL — ABNORMAL HIGH (ref 0.57–1.00)
GFR calc Af Amer: 43 mL/min/{1.73_m2} — ABNORMAL LOW (ref >59)
GFR calc non Af Amer: 37 mL/min/{1.73_m2} — ABNORMAL LOW (ref >59)
Glucose: 93 mg/dL (ref 65–99)
Potassium: 4.2 mmol/L (ref 3.5–5.2)
Sodium: 136 mmol/L (ref 134–144)

## 2019-12-03 LAB — CBC
Hematocrit: 38.7 % (ref 34.0–46.6)
Hemoglobin: 12.4 g/dL (ref 11.1–15.9)
MCH: 27.4 pg (ref 26.6–33.0)
MCHC: 32 g/dL (ref 31.5–35.7)
MCV: 85 fL (ref 79–97)
Platelets: 245 10*3/uL (ref 150–450)
RBC: 4.53 x10E6/uL (ref 3.77–5.28)
RDW: 13.3 % (ref 11.7–15.4)
WBC: 10.1 10*3/uL (ref 3.4–10.8)

## 2019-12-03 LAB — LDL CHOLESTEROL, DIRECT: LDL Direct: 57 mg/dL (ref 0–99)

## 2019-12-05 ENCOUNTER — Other Ambulatory Visit: Payer: Self-pay | Admitting: Internal Medicine

## 2019-12-09 LAB — CUP PACEART INCLINIC DEVICE CHECK
Battery Remaining Longevity: 115 mo
Battery Voltage: 3.01 V
Brady Statistic RV Percent Paced: 0.02 %
Date Time Interrogation Session: 20210315201100
HighPow Impedance: 79 Ohm
Implantable Lead Implant Date: 20110110
Implantable Lead Location: 753860
Implantable Lead Model: 6935
Implantable Pulse Generator Implant Date: 20190301
Lead Channel Impedance Value: 475 Ohm
Lead Channel Impedance Value: 513 Ohm
Lead Channel Pacing Threshold Amplitude: 0.75 V
Lead Channel Pacing Threshold Pulse Width: 0.4 ms
Lead Channel Sensing Intrinsic Amplitude: 20.5 mV
Lead Channel Setting Pacing Amplitude: 2.5 V
Lead Channel Setting Pacing Pulse Width: 0.4 ms
Lead Channel Setting Sensing Sensitivity: 0.3 mV

## 2019-12-11 ENCOUNTER — Other Ambulatory Visit: Payer: Self-pay | Admitting: Cardiovascular Disease

## 2019-12-13 ENCOUNTER — Telehealth: Payer: Self-pay

## 2019-12-13 NOTE — Telephone Encounter (Signed)
-----   Message from Deboraha Sprang, MD sent at 12/12/2019  9:32 PM EDT ----- Please Inform Patient  Labs are normal x mld worsening in kindney function=, changing gradually  will need to keep an eye and she should probably follow up with her PCP   Thanks

## 2019-12-13 NOTE — Telephone Encounter (Signed)
Attempted to call patient. LMTCB 12/13/2019   

## 2019-12-13 NOTE — Telephone Encounter (Signed)
Call to patient to review labs.    Pt verbalized understanding and has no further questions at this time.    Advised pt to call for any further questions or concerns.  No further orders.   

## 2019-12-13 NOTE — Telephone Encounter (Signed)
Patient calling to discuss recent testing results  ° °Please call  ° °

## 2020-01-21 DIAGNOSIS — Z79899 Other long term (current) drug therapy: Secondary | ICD-10-CM | POA: Diagnosis not present

## 2020-01-21 DIAGNOSIS — Z0000 Encounter for general adult medical examination without abnormal findings: Secondary | ICD-10-CM | POA: Diagnosis not present

## 2020-01-21 DIAGNOSIS — Z1389 Encounter for screening for other disorder: Secondary | ICD-10-CM | POA: Diagnosis not present

## 2020-01-21 DIAGNOSIS — E782 Mixed hyperlipidemia: Secondary | ICD-10-CM | POA: Diagnosis not present

## 2020-01-21 DIAGNOSIS — Z6826 Body mass index (BMI) 26.0-26.9, adult: Secondary | ICD-10-CM | POA: Diagnosis not present

## 2020-01-21 DIAGNOSIS — I429 Cardiomyopathy, unspecified: Secondary | ICD-10-CM | POA: Diagnosis not present

## 2020-01-21 DIAGNOSIS — I2510 Atherosclerotic heart disease of native coronary artery without angina pectoris: Secondary | ICD-10-CM | POA: Diagnosis not present

## 2020-01-21 DIAGNOSIS — F341 Dysthymic disorder: Secondary | ICD-10-CM | POA: Diagnosis not present

## 2020-02-26 ENCOUNTER — Ambulatory Visit (INDEPENDENT_AMBULATORY_CARE_PROVIDER_SITE_OTHER): Payer: Medicare PPO | Admitting: *Deleted

## 2020-02-26 DIAGNOSIS — I255 Ischemic cardiomyopathy: Secondary | ICD-10-CM | POA: Diagnosis not present

## 2020-02-26 LAB — CUP PACEART REMOTE DEVICE CHECK
Battery Remaining Longevity: 113 mo
Battery Voltage: 3 V
Brady Statistic RV Percent Paced: 0.01 %
Date Time Interrogation Session: 20210609012206
HighPow Impedance: 80 Ohm
Implantable Lead Implant Date: 20110110
Implantable Lead Location: 753860
Implantable Lead Model: 6935
Implantable Pulse Generator Implant Date: 20190301
Lead Channel Impedance Value: 513 Ohm
Lead Channel Impedance Value: 570 Ohm
Lead Channel Pacing Threshold Amplitude: 0.75 V
Lead Channel Pacing Threshold Pulse Width: 0.4 ms
Lead Channel Sensing Intrinsic Amplitude: 19.125 mV
Lead Channel Sensing Intrinsic Amplitude: 19.125 mV
Lead Channel Setting Pacing Amplitude: 2.5 V
Lead Channel Setting Pacing Pulse Width: 0.4 ms
Lead Channel Setting Sensing Sensitivity: 0.3 mV

## 2020-02-27 NOTE — Progress Notes (Signed)
Remote ICD transmission.   

## 2020-04-06 ENCOUNTER — Telehealth: Payer: Self-pay | Admitting: Cardiovascular Disease

## 2020-04-06 NOTE — Telephone Encounter (Signed)
Ms. Scalia reports her BP for the last few days has read 70s/40s. HR = 80s. She is also experiencing some mild lightheadedness. She reports her BP at baseline is usually 90s/60s.  Instructed the patient to STOP LOSARTAN. She will take VS tonight and hold metoprolol if BP is still very low. She will make sure to stay hydrated. She will keep a record of BP. Will call the patient in a couple days to see if there is improvement. She will call prior to that time if symptoms worsen. ED precautions reviewed.  To [facility] for any further recommendations.

## 2020-04-06 NOTE — Telephone Encounter (Signed)
Agree with recs to hold losartan for a few days and follow up with [place] in a few days with BP readings. Could also consider changing carvedilol to Toprol since Toprol would have less of an effect on her BP but still help with her sinus tachycardia.

## 2020-04-06 NOTE — Telephone Encounter (Signed)
°  Pt c/o medication issue:  1. Name of Medication:   carvedilol (COREG) 12.5 MG tablet losartan (COZAAR) 50 MG tablet  2. How are you currently taking this medication (dosage and times per day)? As directed  3. Are you having a reaction (difficulty breathing--STAT)?  Dizziness, nausea, low BP  4. What is your medication issue? Patient states that she is having dizziness, nausea and her BP is running lower than normal. BP 77/45 this morning. She wonders if it could be coming from her medication. Her low BP has been happening for about a week now.

## 2020-04-09 MED ORDER — METOPROLOL TARTRATE 50 MG PO TABS: 50.0000 mg | ORAL_TABLET | Freq: Two times a day (BID) | ORAL | 3 refills | Status: DC

## 2020-04-09 NOTE — Telephone Encounter (Signed)
Instructed patient to STOP COREG and START METOPROLOL 50 mg BID. She will monitor BP and HR and bring log to visit with Richardson Dopp scheduled 8/3. Since she has improved some since stopping Losartan, she will continue to monitor symptoms and call if they worsen prior to visit. She was grateful for assistance.

## 2020-04-09 NOTE — Telephone Encounter (Signed)
Patient is calling to report she still has the dizziness and headache.

## 2020-04-09 NOTE — Telephone Encounter (Signed)
Ok to change from carvedilol to metoprolol for less effect on BP. Equivalent dose would be metoprolol tartrate 50mg   BID or Toprol 100mg  daily.

## 2020-04-09 NOTE — Telephone Encounter (Signed)
The patient reports she is still experiencing dizziness and lack of appetite.  Her blood pressure has improved since holding losartan: Her last 3 BP readings over the last 2 days are: 94/52 103/53 92/57 Her HR runs between 80-105.   Will send to [facility] for medication advisement.

## 2020-04-14 ENCOUNTER — Telehealth: Payer: Self-pay | Admitting: Cardiovascular Disease

## 2020-04-14 MED ORDER — METOPROLOL TARTRATE 50 MG PO TABS: 25.0000 mg | ORAL_TABLET | Freq: Two times a day (BID) | ORAL | 3 refills | Status: DC

## 2020-04-14 NOTE — Telephone Encounter (Signed)
[name] [name] is calling requesting to speak with a nurse in regards to her previous discussion with [name] on 04/06/20. She states her symptoms are still the same as they previously were. Please advise.

## 2020-04-14 NOTE — Telephone Encounter (Signed)
Pt advised and verbalized understanding of her instructions.

## 2020-04-14 NOTE — Telephone Encounter (Addendum)
Pt called to report that she still has not had much improvement since her 04/06/20 med change: pt had stopped her Coreg and started Metoprolol 50 mg po bid.   Pt says she has not been taking her Lasix for the last 3 doses ( 20 mg QOD). She says she is not having any peripheral edema.   Her BP today was 80/47 and HR 85. She had taken it early this morning.Marland Kitchen at this time 10:48 am pt still has not eaten or drank anything but just opened a bottle of Body Armour to drink. She says that she has not had any appetite for the last 2 weeks so she has not been eating.  I advised her to try and have breakfast/ lunch... to hydrate better with fluids.   She will call her PCP to make an appt to be assessed. I will forward to the MD/RPH to see if she can reduce her meds any further based on the updated information.... She has an appt 04/21/20 with Richardson Dopp PA.

## 2020-04-14 NOTE — Telephone Encounter (Signed)
BP unchanged since holding losartan, Lasix, and changing from carvedilol to metoprolol. Could cut metoprolol dose in half and have pt take 25mg  twice daily. These BP readings do appear close to her baseline. She should bring in home cuff and BP readings to visit with Scott to ensure home cuff is measuring BP correctly.

## 2020-04-21 ENCOUNTER — Other Ambulatory Visit: Payer: Self-pay

## 2020-04-21 ENCOUNTER — Ambulatory Visit: Payer: Medicare PPO | Admitting: Physician Assistant

## 2020-04-21 ENCOUNTER — Encounter: Payer: Self-pay | Admitting: Physician Assistant

## 2020-04-21 VITALS — BP 120/60 | HR 86 | Ht 64.0 in | Wt 147.8 lb

## 2020-04-21 DIAGNOSIS — R42 Dizziness and giddiness: Secondary | ICD-10-CM | POA: Diagnosis not present

## 2020-04-21 DIAGNOSIS — R0602 Shortness of breath: Secondary | ICD-10-CM | POA: Diagnosis not present

## 2020-04-21 DIAGNOSIS — I2510 Atherosclerotic heart disease of native coronary artery without angina pectoris: Secondary | ICD-10-CM

## 2020-04-21 DIAGNOSIS — I5022 Chronic systolic (congestive) heart failure: Secondary | ICD-10-CM | POA: Diagnosis not present

## 2020-04-21 DIAGNOSIS — I255 Ischemic cardiomyopathy: Secondary | ICD-10-CM | POA: Diagnosis not present

## 2020-04-21 DIAGNOSIS — I959 Hypotension, unspecified: Secondary | ICD-10-CM | POA: Diagnosis not present

## 2020-04-21 DIAGNOSIS — R634 Abnormal weight loss: Secondary | ICD-10-CM

## 2020-04-21 NOTE — Progress Notes (Signed)
Cardiology Office Note:    Date:  04/21/2020   ID:  RHEANNON CERNEY, DOB [birth year], MRN [id number]  PCP:  Redmond School, MD  Cardiologist:  Sherren Mocha, MD   Electrophysiologist:  None   Referring MD: Redmond School, MD   Chief Complaint:  Blood pressure issues    Patient Profile:    [name] [name] is a 64 y.o. female with:   Coronary artery disease   S/p anterior MI in 2010 tx with PCI to LAD  Systolic CHF  Ischemic CM; EF 35-40  S/p ICD  Hx of several inappropriate ICD DCs in 09/2015  Hyperlipidemia   Chronic kidney disease   GERD  Prior CV studies: Echocardiogram 07/31/2019 EF 60-65, normal RVSF, trivial TR - *difficult acoustic windows; images reviewed by Dr. Burt Knack; study felt to demonstrate stable moderate LV dysfunction  Echocardiogram 10/29/2012 EF 35-40, anteroseptal akinesis, inferior and inferoseptal akinesis, GR II DD, PASP 31  Cardiac catheterization 07/23/2009 LM patent with mild plaque LAD ostial 100 AV groove circumflex 30-50, distal severe stenosis; L PDA 80 RCA mid 50 EF 35 PCI: BMS to the LAD  History of Present Illness:    [name] [name] was last seen by Dr. Burt Knack in November 2020.  The patient called in recently with symptomatic hypotension.  Her medications have been adjusted.  She is seen today for further evaluation.    She is here with her son.  Over the last 2 weeks, she has noted dizziness.  She describes this as a lightheaded feeling.  She also notes dyspnea with some activities.  This is unusual for her.  The dizziness also seems to occur with activity.  Her blood pressure is usually in the 14H, systolic.  Recently, it has been in the 6s and 80s.  Her losartan was stopped and her carvedilol was switched to metoprolol tartrate.  Her furosemide was also reduced.  She has had some improvement in her dizziness.  She has not had syncope, chest discomfort, orthopnea or leg swelling.  Past Medical History:  Diagnosis Date  . Cervical disc  disease   . Depression   . Dyslipidemia   . GERD (gastroesophageal reflux disease)   . ICD (implantable cardiac defibrillator), single MDT    treated with BMS to LAD  . Ischemic cardiomyopathy    post-MI LVEF less than 30%; acute AMI Rx w BMS>>LAD    Current Medications: Current Meds  Medication Sig  . acetaminophen (TYLENOL) 325 MG tablet Take 650 mg by mouth every 6 (six) hours as needed (FOR PAIN.).   Marland Kitchen aspirin EC 81 MG EC tablet Take 1 tablet (81 mg total) by mouth daily.  . DULoxetine (CYMBALTA) 60 MG capsule Take 60 mg by mouth daily.   . furosemide (LASIX) 20 MG tablet TAKE 1 TABLET BY MOUTH EVERY OTHER DAY  . metoprolol tartrate (LOPRESSOR) 50 MG tablet Take 0.5 tablets (25 mg total) by mouth 2 (two) times daily.  . nitroGLYCERIN (NITROSTAT) 0.4 MG SL tablet Place 0.4 mg under the tongue every 5 (five) minutes x 3 doses as needed for chest pain.   . pantoprazole (PROTONIX) 40 MG tablet Take 1 tablet (40 mg total) by mouth daily.  . potassium chloride SA (KLOR-CON) 20 MEQ tablet TAKE 2 TABLETS(40 MEQ) BY MOUTH DAILY  . rosuvastatin (CRESTOR) 40 MG tablet Take 1 tablet (40 mg total) by mouth daily.     Allergies:   Patient has no known allergies.   Social History   Tobacco Use  .  Smoking status: Former Smoker    Packs/day: 0.50    Years: 10.00    Pack years: 5.00  . Smokeless tobacco: Never Used  . Tobacco comment: smoked 1/2 ppd for at least 10 years-quit smoking at the time of her MI  Vaping Use  . Vaping Use: Never used  Substance Use Topics  . Alcohol use: No  . Drug use: No     Family Hx: The patient's family history includes Heart attack in her mother.  Review of Systems  Constitutional: Positive for decreased appetite and weight loss. Negative for fever.  Respiratory: Negative for cough.   Gastrointestinal: Negative for hematochezia and melena.  Genitourinary: Negative for hematuria.     EKGs/Labs/Other Test Reviewed:    EKG:  EKG is   ordered today.   The ekg ordered today demonstrates normal sinus rhythm, heart rate 86, normal axis, septal Q waves, no ST-T wave changes, QTC 435, prior tracing  Recent Labs: 12/02/2019: BUN 10; Creatinine, Ser 1.50; Hemoglobin 12.4; Platelets 245; Potassium 4.2; Sodium 136   Recent Lipid Panel Lab Results  Component Value Date/Time   CHOL 114 (L) 02/24/2016 10:41 AM   TRIG 146 02/24/2016 10:41 AM   HDL 35 (L) 02/24/2016 10:41 AM   CHOLHDL 3.3 02/24/2016 10:41 AM   LDLCALC 50 02/24/2016 10:41 AM   LDLDIRECT 57 12/02/2019 03:34 PM   LDLDIRECT 70.8 10/27/2010 10:20 AM    Physical Exam:    VS:  BP 120/60   Pulse 86   Ht 5\' 4"  (1.626 m)   Wt 147 lb 12.8 oz (67 kg)   SpO2 97%   BMI 25.37 kg/m     Wt Readings from Last 3 Encounters:  04/21/20 147 lb 12.8 oz (67 kg)  12/02/19 157 lb (71.2 kg)  07/24/19 156 lb 12.8 oz (71.1 kg)     Constitutional:      Appearance: Healthy appearance. Not in distress.  Eyes:     Comments: Palpebral conjunctivae are pale  Neck:     Thyroid: No thyromegaly.     Vascular: JVD normal.  Pulmonary:     Effort: Pulmonary effort is normal.     Breath sounds: No wheezing. No rales.  Cardiovascular:     Normal rate. Regular rhythm. Normal S1. Normal S2.     Murmurs: There is no murmur.  Edema:    Peripheral edema absent.  Abdominal:     Palpations: Abdomen is soft. There is no hepatomegaly.  Skin:    General: Skin is warm and dry.  Neurological:     Mental Status: Alert and oriented to person, place and time.     Cranial Nerves: Cranial nerves are intact.       ASSESSMENT & PLAN:    1. Hypotension, unspecified hypotension type 2. Shortness of breath 3. Dizziness 4. Weight loss Her heart failure/hypertension medications have recently been adjusted due to hypotension.  She has been short of breath with exertion and is also noted a lightheadedness.  She has lost about 10 pounds since March.  She notes a poor appetite recently.  Her conjunctiva appear pale.   I suspect that she is anemic and this is contributing to her symptomatology.  Obtain CMET, CBC, TSH today.  I will also have her send in a remote check for her device to see if she is having any arrhythmias contributing to her symptoms.  Given her description, I doubt that this is the case.  Arrange follow-up in 2 weeks.  5. Coronary  artery disease involving native coronary artery of native heart without angina pectoris History of angina microinfarction 2010 treated with PCI to the LAD.  She has not had an assessment for ischemia since that time.  She does note dyspnea with exertion.  Question of this could be an anginal equivalent.  Her ECG does not demonstrate acute findings.  If her hemoglobin is normal, I will arrange a Lexiscan Myoview.  Continue aspirin, statin, beta-blocker.  6. Chronic systolic CHF (congestive heart failure) ([place]) 7. Ischemic cardiomyopathy EF 35-40.  NYHA II-IIb.  Volume status appears stable.  She is no longer on ARB secondary to hypotension.  Continue beta-blocker.  Hopefully, we can resume her ARB at some point.    Dispo:  Return in about 2 weeks (around 05/05/2020) for Close Follow Up, w/ Dr. Burt Knack, or Richardson Dopp, PA-C, in person.   Medication Adjustments/Labs and Tests Ordered: Current medicines are reviewed at length with the patient today.  Concerns regarding medicines are outlined above.  Tests Ordered: Orders Placed This Encounter  Procedures  . CBC  . Comprehensive metabolic panel  . TSH   Medication Changes: No orders of the defined types were placed in this encounter.   Signed, Richardson Dopp, PA-C  04/21/2020 3:00 PM    [facility] [address], [place], [place]  [address] Phone: [phone]; Fax: [phone]

## 2020-04-21 NOTE — Patient Instructions (Signed)
Medication Instructions:  Your physician recommends that you continue on your current medications as directed. Please refer to the Current Medication list given to you today.  *If you need a refill on your cardiac medications before your next appointment, please call your pharmacy*  Lab Work: You will have labs drawn today: CMET/CBC/TSH  Testing/Procedures: None ordered today  Follow-Up:  On 05/12/20 at 11:15AM with Richardson Dopp, PA-C

## 2020-04-22 ENCOUNTER — Inpatient Hospital Stay (HOSPITAL_COMMUNITY)
Admission: EM | Admit: 2020-04-22 | Discharge: 2020-04-25 | DRG: 378 | Disposition: A | Discharge status: Home / Self Care | Payer: Medicare PPO | Attending: Internal Medicine | Admitting: Internal Medicine

## 2020-04-22 ENCOUNTER — Encounter (HOSPITAL_COMMUNITY): Payer: Self-pay | Admitting: Emergency Medicine

## 2020-04-22 ENCOUNTER — Telehealth: Payer: Self-pay | Admitting: *Deleted

## 2020-04-22 DIAGNOSIS — Z6825 Body mass index (BMI) 25.0-25.9, adult: Secondary | ICD-10-CM

## 2020-04-22 DIAGNOSIS — K449 Diaphragmatic hernia without obstruction or gangrene: Secondary | ICD-10-CM | POA: Diagnosis not present

## 2020-04-22 DIAGNOSIS — Z90711 Acquired absence of uterus with remaining cervical stump: Secondary | ICD-10-CM

## 2020-04-22 DIAGNOSIS — I2510 Atherosclerotic heart disease of native coronary artery without angina pectoris: Secondary | ICD-10-CM | POA: Diagnosis present

## 2020-04-22 DIAGNOSIS — K5730 Diverticulosis of large intestine without perforation or abscess without bleeding: Secondary | ICD-10-CM | POA: Diagnosis not present

## 2020-04-22 DIAGNOSIS — E860 Dehydration: Secondary | ICD-10-CM | POA: Diagnosis present

## 2020-04-22 DIAGNOSIS — K922 Gastrointestinal hemorrhage, unspecified: Principal | ICD-10-CM | POA: Diagnosis present

## 2020-04-22 DIAGNOSIS — Z01818 Encounter for other preprocedural examination: Secondary | ICD-10-CM

## 2020-04-22 DIAGNOSIS — N170 Acute kidney failure with tubular necrosis: Secondary | ICD-10-CM | POA: Diagnosis not present

## 2020-04-22 DIAGNOSIS — R634 Abnormal weight loss: Secondary | ICD-10-CM | POA: Diagnosis present

## 2020-04-22 DIAGNOSIS — D509 Iron deficiency anemia, unspecified: Secondary | ICD-10-CM | POA: Diagnosis not present

## 2020-04-22 DIAGNOSIS — D500 Iron deficiency anemia secondary to blood loss (chronic): Secondary | ICD-10-CM | POA: Diagnosis not present

## 2020-04-22 DIAGNOSIS — K649 Unspecified hemorrhoids: Secondary | ICD-10-CM | POA: Diagnosis not present

## 2020-04-22 DIAGNOSIS — I255 Ischemic cardiomyopathy: Secondary | ICD-10-CM | POA: Diagnosis present

## 2020-04-22 DIAGNOSIS — R0602 Shortness of breath: Secondary | ICD-10-CM | POA: Diagnosis not present

## 2020-04-22 DIAGNOSIS — I130 Hypertensive heart and chronic kidney disease with heart failure and stage 1 through stage 4 chronic kidney disease, or unspecified chronic kidney disease: Secondary | ICD-10-CM | POA: Diagnosis present

## 2020-04-22 DIAGNOSIS — Z7982 Long term (current) use of aspirin: Secondary | ICD-10-CM

## 2020-04-22 DIAGNOSIS — R61 Generalized hyperhidrosis: Secondary | ICD-10-CM | POA: Diagnosis present

## 2020-04-22 DIAGNOSIS — E869 Volume depletion, unspecified: Secondary | ICD-10-CM | POA: Diagnosis present

## 2020-04-22 DIAGNOSIS — K635 Polyp of colon: Secondary | ICD-10-CM | POA: Diagnosis present

## 2020-04-22 DIAGNOSIS — Z95810 Presence of automatic (implantable) cardiac defibrillator: Secondary | ICD-10-CM | POA: Diagnosis present

## 2020-04-22 DIAGNOSIS — Z8542 Personal history of malignant neoplasm of other parts of uterus: Secondary | ICD-10-CM

## 2020-04-22 DIAGNOSIS — Z9010 Acquired absence of unspecified breast and nipple: Secondary | ICD-10-CM

## 2020-04-22 DIAGNOSIS — D649 Anemia, unspecified: Secondary | ICD-10-CM | POA: Diagnosis not present

## 2020-04-22 DIAGNOSIS — Z955 Presence of coronary angioplasty implant and graft: Secondary | ICD-10-CM

## 2020-04-22 DIAGNOSIS — D124 Benign neoplasm of descending colon: Secondary | ICD-10-CM | POA: Diagnosis not present

## 2020-04-22 DIAGNOSIS — D122 Benign neoplasm of ascending colon: Secondary | ICD-10-CM | POA: Diagnosis not present

## 2020-04-22 DIAGNOSIS — E785 Hyperlipidemia, unspecified: Secondary | ICD-10-CM | POA: Diagnosis present

## 2020-04-22 DIAGNOSIS — K219 Gastro-esophageal reflux disease without esophagitis: Secondary | ICD-10-CM | POA: Diagnosis present

## 2020-04-22 DIAGNOSIS — I5022 Chronic systolic (congestive) heart failure: Secondary | ICD-10-CM | POA: Diagnosis present

## 2020-04-22 DIAGNOSIS — N1832 Chronic kidney disease, stage 3b: Secondary | ICD-10-CM | POA: Diagnosis present

## 2020-04-22 DIAGNOSIS — K222 Esophageal obstruction: Secondary | ICD-10-CM | POA: Diagnosis present

## 2020-04-22 DIAGNOSIS — F329 Major depressive disorder, single episode, unspecified: Secondary | ICD-10-CM | POA: Diagnosis present

## 2020-04-22 DIAGNOSIS — I252 Old myocardial infarction: Secondary | ICD-10-CM

## 2020-04-22 DIAGNOSIS — K31811 Angiodysplasia of stomach and duodenum with bleeding: Secondary | ICD-10-CM | POA: Diagnosis not present

## 2020-04-22 DIAGNOSIS — M5090 Cervical disc disorder, unspecified, unspecified cervical region: Secondary | ICD-10-CM | POA: Diagnosis present

## 2020-04-22 DIAGNOSIS — R195 Other fecal abnormalities: Secondary | ICD-10-CM

## 2020-04-22 DIAGNOSIS — Z79899 Other long term (current) drug therapy: Secondary | ICD-10-CM

## 2020-04-22 DIAGNOSIS — Z20822 Contact with and (suspected) exposure to covid-19: Secondary | ICD-10-CM | POA: Diagnosis present

## 2020-04-22 DIAGNOSIS — K31819 Angiodysplasia of stomach and duodenum without bleeding: Secondary | ICD-10-CM | POA: Diagnosis not present

## 2020-04-22 DIAGNOSIS — Z87891 Personal history of nicotine dependence: Secondary | ICD-10-CM

## 2020-04-22 DIAGNOSIS — N179 Acute kidney failure, unspecified: Secondary | ICD-10-CM | POA: Diagnosis present

## 2020-04-22 DIAGNOSIS — Z8249 Family history of ischemic heart disease and other diseases of the circulatory system: Secondary | ICD-10-CM

## 2020-04-22 DIAGNOSIS — K640 First degree hemorrhoids: Secondary | ICD-10-CM | POA: Diagnosis present

## 2020-04-22 DIAGNOSIS — E876 Hypokalemia: Secondary | ICD-10-CM | POA: Diagnosis not present

## 2020-04-22 DIAGNOSIS — I5020 Unspecified systolic (congestive) heart failure: Secondary | ICD-10-CM | POA: Diagnosis present

## 2020-04-22 DIAGNOSIS — D490 Neoplasm of unspecified behavior of digestive system: Secondary | ICD-10-CM | POA: Diagnosis not present

## 2020-04-22 HISTORY — DX: Malignant neoplasm of uterus, part unspecified: C55

## 2020-04-22 LAB — IRON AND TIBC
Iron: 9 ug/dL — ABNORMAL LOW (ref 28–170)
Saturation Ratios: 2 % — ABNORMAL LOW (ref 10.4–31.8)
TIBC: 419 ug/dL (ref 250–450)
UIBC: 410 ug/dL

## 2020-04-22 LAB — CBC
HCT: 14.4 % — ABNORMAL LOW (ref 36.0–46.0)
HCT: 19.8 % — ABNORMAL LOW (ref 36.0–46.0)
Hematocrit: 13.8 % — CL (ref 34.0–46.6)
Hemoglobin: 4 g/dL — CL (ref 11.1–15.9)
Hemoglobin: 4 g/dL — CL (ref 12.0–15.0)
Hemoglobin: 5.7 g/dL — CL (ref 12.0–15.0)
MCH: 26.3 pg — ABNORMAL LOW (ref 26.6–33.0)
MCH: 26.7 pg (ref 26.0–34.0)
MCH: 26.5 pg (ref 26.0–34.0)
MCHC: 29 g/dL — ABNORMAL LOW (ref 31.5–35.7)
MCHC: 27.8 g/dL — ABNORMAL LOW (ref 30.0–36.0)
MCHC: 28.8 g/dL — ABNORMAL LOW (ref 30.0–36.0)
MCV: 91 fL (ref 79–97)
MCV: 96 fL (ref 80.0–100.0)
MCV: 92.1 fL (ref 80.0–100.0)
Platelets: 302 10*3/uL (ref 150–450)
Platelets: 290 10*3/uL (ref 150–400)
Platelets: 245 10*3/uL (ref 150–400)
RBC: 1.52 x10E6/uL — CL (ref 3.77–5.28)
RBC: 1.5 MIL/uL — ABNORMAL LOW (ref 3.87–5.11)
RBC: 2.15 MIL/uL — ABNORMAL LOW (ref 3.87–5.11)
RDW: 14.8 % (ref 11.7–15.4)
RDW: 15.7 % — ABNORMAL HIGH (ref 11.5–15.5)
RDW: 16.2 % — ABNORMAL HIGH (ref 11.5–15.5)
WBC: 7.3 10*3/uL (ref 3.4–10.8)
WBC: 5.1 10*3/uL (ref 4.0–10.5)
WBC: 5.3 10*3/uL (ref 4.0–10.5)
nRBC: 0.4 % — ABNORMAL HIGH (ref 0.0–0.2)
nRBC: 0.6 % — ABNORMAL HIGH (ref 0.0–0.2)

## 2020-04-22 LAB — PREPARE RBC (CROSSMATCH)

## 2020-04-22 LAB — COMPREHENSIVE METABOLIC PANEL
ALT: 36 IU/L — ABNORMAL HIGH (ref 0–32)
ALT: 35 U/L (ref 0–44)
AST: 20 IU/L (ref 0–40)
AST: 26 U/L (ref 15–41)
Albumin/Globulin Ratio: 1.9 (ref 1.2–2.2)
Albumin: 4 g/dL (ref 3.8–4.8)
Albumin: 3.9 g/dL (ref 3.5–5.0)
Alkaline Phosphatase: 124 IU/L — ABNORMAL HIGH (ref 48–121)
Alkaline Phosphatase: 87 U/L (ref 38–126)
Anion gap: 7 (ref 5–15)
BUN/Creatinine Ratio: 8 — ABNORMAL LOW (ref 12–28)
BUN: 15 mg/dL (ref 8–27)
BUN: 16 mg/dL (ref 8–23)
Bilirubin Total: <0.2 mg/dL (ref 0.0–1.2)
CO2: 16 mmol/L — ABNORMAL LOW (ref 20–29)
CO2: 16 mmol/L — ABNORMAL LOW (ref 22–32)
Calcium: 9.1 mg/dL (ref 8.7–10.3)
Calcium: 8.9 mg/dL (ref 8.9–10.3)
Chloride: 105 mmol/L (ref 96–106)
Chloride: 112 mmol/L — ABNORMAL HIGH (ref 98–111)
Creatinine, Ser: 1.95 mg/dL — ABNORMAL HIGH (ref 0.57–1.00)
Creatinine, Ser: 2.28 mg/dL — ABNORMAL HIGH (ref 0.44–1.00)
GFR calc Af Amer: 31 mL/min/{1.73_m2} — ABNORMAL LOW (ref >59)
GFR calc Af Amer: 26 mL/min — ABNORMAL LOW (ref >60)
GFR calc non Af Amer: 27 mL/min/{1.73_m2} — ABNORMAL LOW (ref >59)
GFR calc non Af Amer: 22 mL/min — ABNORMAL LOW (ref >60)
Globulin, Total: 2.1 g/dL (ref 1.5–4.5)
Glucose, Bld: 115 mg/dL — ABNORMAL HIGH (ref 70–99)
Glucose: 105 mg/dL — ABNORMAL HIGH (ref 65–99)
Potassium: 5.3 mmol/L — ABNORMAL HIGH (ref 3.5–5.2)
Potassium: 3.6 mmol/L (ref 3.5–5.1)
Sodium: 135 mmol/L (ref 134–144)
Sodium: 135 mmol/L (ref 135–145)
Total Bilirubin: 0.6 mg/dL (ref 0.3–1.2)
Total Protein: 6.1 g/dL (ref 6.0–8.5)
Total Protein: 7 g/dL (ref 6.5–8.1)

## 2020-04-22 LAB — VITAMIN B12: Vitamin B-12: 261 pg/mL (ref 180–914)

## 2020-04-22 LAB — TSH: TSH: 4.08 u[IU]/mL (ref 0.450–4.500)

## 2020-04-22 LAB — RETICULOCYTES
Immature Retic Fract: 30 % — ABNORMAL HIGH (ref 2.3–15.9)
RBC.: 1.38 MIL/uL — ABNORMAL LOW (ref 3.87–5.11)
Retic Count, Absolute: 66.9 10*3/uL (ref 19.0–186.0)
Retic Ct Pct: 4.9 % — ABNORMAL HIGH (ref 0.4–3.1)

## 2020-04-22 LAB — ABO/RH: ABO/RH(D): O POS

## 2020-04-22 LAB — FERRITIN: Ferritin: 29 ng/mL (ref 11–307)

## 2020-04-22 LAB — POC OCCULT BLOOD, ED: Fecal Occult Bld: POSITIVE — AB

## 2020-04-22 LAB — SARS CORONAVIRUS 2 BY RT PCR (HOSPITAL ORDER, PERFORMED IN ~~LOC~~ HOSPITAL LAB): SARS Coronavirus 2: NEGATIVE

## 2020-04-22 LAB — FOLATE: Folate: 17.9 ng/mL (ref >5.9)

## 2020-04-22 LAB — HIV ANTIBODY (ROUTINE TESTING W REFLEX): HIV Screen 4th Generation wRfx: NONREACTIVE

## 2020-04-22 MED ORDER — FUROSEMIDE 10 MG/ML IJ SOLN: 20.0000 mg | Freq: Once | INTRAMUSCULAR | Status: DC

## 2020-04-22 MED ORDER — SODIUM CHLORIDE 0.9 % IV SOLN
8.0000 mg/h | INTRAVENOUS | Status: AC
  Administered 2020-04-22 – 2020-04-25 (×7): 8 mg/h via INTRAVENOUS
  Filled 2020-04-22 (×10): qty 80

## 2020-04-22 MED ORDER — DULOXETINE HCL 60 MG PO CPEP
60.0000 mg | ORAL_CAPSULE | Freq: Every day | ORAL | Status: DC
  Administered 2020-04-23 – 2020-04-25 (×3): 60 mg via ORAL
  Filled 2020-04-22 (×3): qty 1

## 2020-04-22 MED ORDER — PEG 3350-KCL-NA BICARB-NACL 420 G PO SOLR
4000.0000 mL | Freq: Once | ORAL | Status: AC
  Administered 2020-04-23: 4000 mL via ORAL
  Filled 2020-04-22: qty 4000

## 2020-04-22 MED ORDER — ACETAMINOPHEN 325 MG PO TABS: 650.0000 mg | ORAL_TABLET | Freq: Four times a day (QID) | ORAL | Status: DC | PRN

## 2020-04-22 MED ORDER — SODIUM CHLORIDE 0.9% FLUSH
3.0000 mL | Freq: Two times a day (BID) | INTRAVENOUS | Status: DC
  Administered 2020-04-22 – 2020-04-25 (×3): 3 mL via INTRAVENOUS

## 2020-04-22 MED ORDER — SODIUM CHLORIDE 0.9 % IV SOLN
10.0000 mL/h | Freq: Once | INTRAVENOUS | Status: AC
  Administered 2020-04-22: 10 mL/h via INTRAVENOUS

## 2020-04-22 MED ORDER — PANTOPRAZOLE SODIUM 40 MG IV SOLR: 40.0000 mg | Freq: Two times a day (BID) | INTRAVENOUS | Status: DC

## 2020-04-22 MED ORDER — LACTATED RINGERS IV SOLN: INTRAVENOUS | Status: DC

## 2020-04-22 MED ORDER — ACETAMINOPHEN 650 MG RE SUPP: 650.0000 mg | Freq: Four times a day (QID) | RECTAL | Status: DC | PRN

## 2020-04-22 MED ORDER — ROSUVASTATIN CALCIUM 20 MG PO TABS
40.0000 mg | ORAL_TABLET | Freq: Every day | ORAL | Status: DC
  Administered 2020-04-22 – 2020-04-24 (×3): 40 mg via ORAL
  Filled 2020-04-22 (×3): qty 2

## 2020-04-22 MED ORDER — ONDANSETRON HCL 4 MG PO TABS: 4.0000 mg | ORAL_TABLET | Freq: Four times a day (QID) | ORAL | Status: DC | PRN

## 2020-04-22 MED ORDER — SODIUM CHLORIDE 0.9% IV SOLUTION: Freq: Once | INTRAVENOUS | Status: AC

## 2020-04-22 MED ORDER — DIPHENHYDRAMINE HCL 25 MG PO CAPS
25.0000 mg | ORAL_CAPSULE | Freq: Once | ORAL | Status: AC
  Administered 2020-04-23: 25 mg via ORAL
  Filled 2020-04-22: qty 1

## 2020-04-22 MED ORDER — SODIUM CHLORIDE 0.9 % IV SOLN: INTRAVENOUS | Status: DC

## 2020-04-22 MED ORDER — SODIUM CHLORIDE 0.9 % IV SOLN
80.0000 mg | Freq: Once | INTRAVENOUS | Status: AC
  Administered 2020-04-22: 80 mg via INTRAVENOUS
  Filled 2020-04-22: qty 80

## 2020-04-22 MED ORDER — ONDANSETRON HCL 4 MG/2ML IJ SOLN: 4.0000 mg | Freq: Four times a day (QID) | INTRAMUSCULAR | Status: DC | PRN

## 2020-04-22 MED ORDER — ACETAMINOPHEN 325 MG PO TABS
650.0000 mg | ORAL_TABLET | Freq: Once | ORAL | Status: AC
  Administered 2020-04-23: 650 mg via ORAL
  Filled 2020-04-22: qty 2

## 2020-04-22 NOTE — Consult Note (Signed)
Referring Provider: Dr. Lorin Mercy Primary Care Physician:  Redmond School, MD Primary Gastroenterologist:  Althia Forts  Reason for Consultation:  Anemia; Heme positive stool  HPI: [name] [name] is a 64 y.o. female with 3 weeks of weakness without abdominal pain, melena, hematochezia, hematemesis, or N/V. Has unintentionally lost 10 pounds in the past 2 weeks. History of GERD. History of CHF, CAD with stent placement. Has never had a colonoscopy. Denies any history of anemia. Covid test pending.  Past Medical History:  Diagnosis Date  . Cervical disc disease   . Depression   . Dyslipidemia   . GERD (gastroesophageal reflux disease)   . ICD (implantable cardiac defibrillator), single MDT    treated with BMS to LAD  . Ischemic cardiomyopathy    post-MI LVEF less than 30%; acute AMI Rx w BMS>>LAD    Past Surgical History:  Procedure Laterality Date  . CERVICAL DISC SURGERY    . CORONARY ANGIOPLASTY WITH STENT PLACEMENT    . ICD GENERATOR CHANGEOUT N/A 11/17/2017   Procedure: ICD GENERATOR CHANGEOUT;  Surgeon: Deboraha Sprang, MD;  Location: [facility];  Service: Cardiovascular;  Laterality: N/A;  . icd implanted    . partial right masectomy      Prior to Admission medications   Medication Sig Start Date End Date Taking? Authorizing Provider  acetaminophen (TYLENOL) 325 MG tablet Take 650 mg by mouth every 6 (six) hours as needed for mild pain or moderate pain.    Yes [provider]  aspirin EC 81 MG EC tablet Take 1 tablet (81 mg total) by mouth daily. 05/02/11  Yes Sherren Mocha, MD  DULoxetine (CYMBALTA) 60 MG capsule Take 60 mg by mouth daily.  11/25/14  Yes [provider]  furosemide (LASIX) 20 MG tablet TAKE 1 TABLET BY MOUTH EVERY OTHER DAY Patient taking differently: Take 20 mg by mouth daily.  12/11/19  Yes Sherren Mocha, MD  metoprolol tartrate (LOPRESSOR) 50 MG tablet Take 0.5 tablets (25 mg total) by mouth 2 (two) times daily. 04/14/20  Yes Supple,  Megan E, RPH-CPP  nitroGLYCERIN (NITROSTAT) 0.4 MG SL tablet Place 0.4 mg under the tongue every 5 (five) minutes x 3 doses as needed for chest pain.    Yes [provider]  pantoprazole (PROTONIX) 40 MG tablet Take 1 tablet (40 mg total) by mouth daily. 07/24/19  Yes Sherren Mocha, MD  potassium chloride SA (KLOR-CON) 20 MEQ tablet TAKE 2 TABLETS(40 MEQ) BY MOUTH DAILY Patient taking differently: Take 40 mEq by mouth once.  09/16/19  Yes Sherren Mocha, MD  rosuvastatin (CRESTOR) 40 MG tablet Take 1 tablet (40 mg total) by mouth daily. Patient taking differently: Take 40 mg by mouth at bedtime.  08/12/19  Yes Sherren Mocha, MD    Scheduled Meds: . Derrill Memo ON 04/26/2020] pantoprazole  40 mg Intravenous Q12H   Continuous Infusions: . pantoprozole (PROTONIX) infusion     PRN Meds:.  Allergies as of 04/22/2020  . (No Known Allergies)    Family History  Problem Relation Age of Onset  . Heart attack Mother     Social History   Socioeconomic History  . Marital status: Divorced    Spouse name: Not on file  . Number of children: Not on file  . Years of education: Not on file  . Highest education level: Not on file  Occupational History  . Not on file  Tobacco Use  . Smoking status: Former Smoker    Packs/day: 0.50  Years: 10.00    Pack years: 5.00  . Smokeless tobacco: Never Used  . Tobacco comment: smoked 1/2 ppd for at least 10 years-quit smoking at the time of her MI  Vaping Use  . Vaping Use: Never used  Substance and Sexual Activity  . Alcohol use: No  . Drug use: No  . Sexual activity: Not on file  Other Topics Concern  . Not on file  Social History Narrative  . Not on file   Social Determinants of Health   Financial Resource Strain:   . Difficulty of Paying Living Expenses:   Food Insecurity:   . Worried About Charity fundraiser in the Last Year:   . Arboriculturist in the Last Year:   Transportation Needs:   . Film/video editor  (Medical):   Marland Kitchen Lack of Transportation (Non-Medical):   Physical Activity:   . Days of Exercise per Week:   . Minutes of Exercise per Session:   Stress:   . Feeling of Stress :   Social Connections:   . Frequency of Communication with Friends and Family:   . Frequency of Social Gatherings with Friends and Family:   . Attends Religious Services:   . Active Member of Clubs or Organizations:   . Attends Archivist Meetings:   Marland Kitchen Marital Status:   Intimate Partner Violence:   . Fear of Current or Ex-Partner:   . Emotionally Abused:   Marland Kitchen Physically Abused:   . Sexually Abused:     Review of Systems: All negative except as stated above in HPI.  Physical Exam: Vital signs: Vitals:   04/22/20 1500 04/22/20 1515  BP: 123/61 118/64  Pulse: 77 80  Resp: 14 14  Temp:    SpO2: 100% 100%  T 98.9   General:   Lethargic, Well-developed, well-nourished, pleasant and cooperative in NAD Head: normocephalic, atraumatic Eyes: anicteric sclera ENT: oropharynx clear Neck: supple, nontender Lungs:  Clear throughout to auscultation.   No wheezes, crackles, or rhonchi. No acute distress. Heart:  Regular rate and rhythm; no murmurs, clicks, rubs,  or gallops. Abdomen: soft, nontender, nondistended, +BS  Rectal:  Deferred Ext: no edema  GI:  Lab Results: Recent Labs    04/21/20 1503 04/22/20 1325  WBC 7.3 5.1  HGB 4.0* 4.0*  HCT 13.8* 14.4*  PLT 302 290   BMET Recent Labs    04/21/20 1503 04/22/20 1325  NA 135 135  K 5.3* 3.6  CL 105 112*  CO2 16* 16*  GLUCOSE 105* 115*  BUN 15 16  CREATININE 1.95* 2.28*  CALCIUM 9.1 8.9   LFT Recent Labs    04/22/20 1325  PROT 7.0  ALBUMIN 3.9  AST 26  ALT 35  ALKPHOS 87  BILITOT 0.6   PT/INR No results for input(s): LABPROT, INR in the last 72 hours.   Studies/Results: No results found.  Impression/Plan: Symptomatic anemia (Hgb 4) and heme positive stool without any overt bleeding. Receiving blood transfusion.  Clear liquid diet. Colon prep tomorrow. EGD/Colonoscopy on 04/24/20.    LOS: 0 days   Lear Ng  04/22/2020, 3:25 PM  Questions please call [phone]

## 2020-04-22 NOTE — H&P (Signed)
History and Physical    [name] [name] HBZ:[id number] DOB: 1956-09-17 DOA: 04/22/2020  PCP: Redmond School, MD Consultants:  [facility] - cardiology Patient coming from:  Home - lives alone; NOK: Daughter, [place], [phone]  Chief Complaint: Symptomatic anemia  HPI: [name] [name] is a 64 y.o. female with medical history significant of chronic systolic CHF with AICD placement; CAD s/p stent placement; and HLD presenting with symptomatic anemia.  About 3 weeks ago, she noticed cramps in her left leg.  She called Dr. Gerarda Fraction and he told her to take flax seed oil and that didn't help the cramps.  She started with headaches and dizziness and called cardiology.  They stopped Losartan and Coreg and started metoprolol 50 mg BID.  It continued and she developed nausea and so they decreased the dose to 25 mg BID.  Yesterday, she went to see PA Kathlen Mody at cardiology and he did blood work.  A nurse called this AM and said her Hgb was critically low and 2 other things were abnormal and she should come to the ER.  +SOB with significant exertion.  Dizziness and headache is when she moves around.  +nausea without vomiting, no diarrhea.  10 pounds unintentional weight loss in 2 weeks.  +chronic night sweats, unchanged.    ED Course:   Fatigued x 3 weeks.  Saw her cardiologist yesterday for this.  No apparent GI bleeding.  Hgb 4, heme positive.  On ASA.  S/p hysterectomy.  [facility] to see.  Started on Protonix, no prior h/o GI evaluation.  Review of Systems: As per HPI; otherwise review of systems reviewed and negative.   Ambulatory Status:  Ambulates without assistance  COVID Vaccine Status:  None  Past Medical History:  Diagnosis Date  . Cervical disc disease    disabled Engineer, structural  . Depression   . Dyslipidemia   . GERD (gastroesophageal reflux disease)   . ICD (implantable cardiac defibrillator), single MDT    treated with BMS to LAD  . Ischemic cardiomyopathy    post-MI LVEF less than 30%; acute  AMI Rx w BMS>>LAD  . Uterine cancer [facility])    age 38, s/p partial hysterectomy    Past Surgical History:  Procedure Laterality Date  . CERVICAL DISC SURGERY    . CORONARY ANGIOPLASTY WITH STENT PLACEMENT    . ICD GENERATOR CHANGEOUT N/A 11/17/2017   Procedure: ICD GENERATOR CHANGEOUT;  Surgeon: Deboraha Sprang, MD;  Location: [facility];  Service: Cardiovascular;  Laterality: N/A;  . icd implanted    . PARTIAL HYSTERECTOMY    . partial right masectomy      Social History   Socioeconomic History  . Marital status: Divorced    Spouse name: Not on file  . Number of children: Not on file  . Years of education: Not on file  . Highest education level: Not on file  Occupational History  . Not on file  Tobacco Use  . Smoking status: Former Smoker    Packs/day: 0.50    Years: 20.00    Pack years: 10.00    Quit date: 2019    Years since quitting: 2.5  . Smokeless tobacco: Never Used  Vaping Use  . Vaping Use: Never used  Substance and Sexual Activity  . Alcohol use: Yes    Comment: rare  . Drug use: No  . Sexual activity: Not on file  Other Topics Concern  . Not on file  Social History Narrative  . Not  on file   Social Determinants of Health   Financial Resource Strain:   . Difficulty of Paying Living Expenses:   Food Insecurity:   . Worried About Charity fundraiser in the Last Year:   . Arboriculturist in the Last Year:   Transportation Needs:   . Film/video editor (Medical):   Marland Kitchen Lack of Transportation (Non-Medical):   Physical Activity:   . Days of Exercise per Week:   . Minutes of Exercise per Session:   Stress:   . Feeling of Stress :   Social Connections:   . Frequency of Communication with Friends and Family:   . Frequency of Social Gatherings with Friends and Family:   . Attends Religious Services:   . Active Member of Clubs or Organizations:   . Attends Archivist Meetings:   Marland Kitchen Marital Status:   Intimate Partner Violence:   .  Fear of Current or Ex-Partner:   . Emotionally Abused:   Marland Kitchen Physically Abused:   . Sexually Abused:     No Known Allergies  Family History  Problem Relation Age of Onset  . Heart attack Mother   . Cancer Neg Hx     Prior to Admission medications   Medication Sig Start Date End Date Taking? Authorizing Provider  acetaminophen (TYLENOL) 325 MG tablet Take 650 mg by mouth every 6 (six) hours as needed for mild pain or moderate pain.    Yes [provider]  aspirin EC 81 MG EC tablet Take 1 tablet (81 mg total) by mouth daily. 05/02/11  Yes Sherren Mocha, MD  DULoxetine (CYMBALTA) 60 MG capsule Take 60 mg by mouth daily.  11/25/14  Yes [provider]  furosemide (LASIX) 20 MG tablet TAKE 1 TABLET BY MOUTH EVERY OTHER DAY Patient taking differently: Take 20 mg by mouth daily.  12/11/19  Yes Sherren Mocha, MD  metoprolol tartrate (LOPRESSOR) 50 MG tablet Take 0.5 tablets (25 mg total) by mouth 2 (two) times daily. 04/14/20  Yes Supple, Megan E, RPH-CPP  nitroGLYCERIN (NITROSTAT) 0.4 MG SL tablet Place 0.4 mg under the tongue every 5 (five) minutes x 3 doses as needed for chest pain.    Yes [provider]  pantoprazole (PROTONIX) 40 MG tablet Take 1 tablet (40 mg total) by mouth daily. 07/24/19  Yes Sherren Mocha, MD  potassium chloride SA (KLOR-CON) 20 MEQ tablet TAKE 2 TABLETS(40 MEQ) BY MOUTH DAILY Patient taking differently: Take 40 mEq by mouth once.  09/16/19  Yes Sherren Mocha, MD  rosuvastatin (CRESTOR) 40 MG tablet Take 1 tablet (40 mg total) by mouth daily. Patient taking differently: Take 40 mg by mouth at bedtime.  08/12/19  Yes Sherren Mocha, MD    Physical Exam: Vitals:   04/22/20 1615 04/22/20 1630 04/22/20 1700 04/22/20 1730  BP: 115/63 122/67 118/61 109/80  Pulse: 80 79 87 83  Resp: 18 15 15 17   Temp:      SpO2: 100% 100% 100% 100%  Weight:      Height:         . General:  Appears calm and comfortable and is NAD . Eyes:  PERRL,  EOMI, normal lids, iris; pale conjunctivae . ENT:  grossly normal hearing, lips & tongue, mmm . Neck:  no LAD, masses or thyromegaly . Cardiovascular:  RRR, no m/r/g. No LE edema.  Marland Kitchen Respiratory:   CTA bilaterally with no wheezes/rales/rhonchi.  Normal respiratory effort. . Abdomen:  soft, NT, ND, NABS .  Back:   normal alignment, no CVAT . Skin:  no rash or induration seen on limited exam . Musculoskeletal:  grossly normal tone BUE/BLE, good ROM, no bony abnormality . Psychiatric:  grossly normal mood and affect, speech fluent and appropriate, AOx3 . Neurologic:  CN 2-12 grossly intact, moves all extremities in coordinated fashion    Radiological Exams on Admission: No results found.  EKG: Independently reviewed.  NSR with rate 78; nonspecific ST changes with no evidence of acute ischemia, NSCSLT   Labs on Admission: I have personally reviewed the available labs and imaging studies at the time of the admission.  Pertinent labs:   CO2 16 Glucose 115 BUN 16/Creatinine 2.28/GFR 26; 15/1.95/31 on 8/3; 10/1.50/43 on 3/15 WBC 5.1 Hgb 4.0 Heme positive Normal TSH on 8/3   Assessment/Plan Principal Problem:   Symptomatic anemia Active Problems:   Hyperlipidemia with target low density lipoprotein (LDL) cholesterol less than 70 mg/dL   CAD, NATIVE VESSEL   SYSTOLIC HEART FAILURE, CHRONIC   Dual implantable cardioverter-defibrillator in situ   Acute renal failure superimposed on stage 3b chronic kidney disease (HCC)    Symptomatic anemia -Patient without prior Gi evaluation in her lifetime, presenting with progressive SOB and dizziness with exertion -Hgb 4 yesterday and today -Heme positive, no report of abnormal stools -Normal BUN, likely lower source - concerning for colon malignancy -Given relatively mild symptoms, this appears to be a gradual bleed over time -Hgb 4.0; normal MCV - appears to be more c/w anemia of chronic disease -Will admit to telemetry bed. -Transfuse 2  units PRBC to start and recheck Hgb afterwards.  Given her very low starting Hgb, she is likely to require more units.   -Consider Lasix between units due to h/o CHF - but will not give currently given AKI -GI has seen the patient and is planning to perform EGD/colonoscopy on Friday -Protonix drip for now -Will hold ASA for now  AKI on stage 3b CKD -Baseline creatinine is roughly 1.5.   -Today's creatinine is 2.28, was 1.95 yesterday -Likely due to prerenal failure secondary to dehydration and continuation of diruetics but also likely with ATN associated with diminished renal perfusion in the setting of a critically low Hgb -Hold diuretics for now -Gentle IVF hydration when not receiving blood products -Follow up renal function by BMP -Avoid nephrotoxic agents  Chronic systolic CHF with defibrillator in place; h/o CAD -s/p AICD, stent placement -Hold ASA for now -Normalization of EF on most recent  (07/2019) echo  HTN -Hold Lopressor given currently low BP -Anticipate normalization with resolution of volume depletion  HLD -Continue Crestor  Depression -Continue Cymbalta      Note: This patient has been tested and is negative for the novel coronavirus COVID-19.  DVT prophylaxis:  SCDs Code Status:  Full - confirmed with patient/family Family Communication: Daughter was present at the end of the visit. Disposition Plan:  The patient is from: home  Anticipated d/c is to: home without [facility] services   Anticipated d/c date will depend on clinical response to treatment, likely over the weekend  Patient is currently: acutely ill Consults called: GI  Admission status:  Admit - It is my clinical opinion that admission to [place] is reasonable and necessary because of the expectation that this patient will require hospital care that crosses at least 2 midnights to treat this condition based on the medical complexity of the problems presented.  Given the aforementioned information,  the predictability of an adverse outcome is felt  to be significant.    Karmen Bongo MD [facility]   How to contact the [facility] Attending or Consulting provider [place] or covering provider during after hours [place], for this patient?  1. Check the care team in [facility] and look for a) attending/consulting TR[facility] provider listed and b) the [facility] team listed 2. Log into [email or web address] and use [place]'s universal password to access. If you do not have the password, please contact the hospital operator. 3. Locate the [facility] provider you are looking for under [facility] and page to a number that you can be directly reached. 4. If you still have difficulty reaching the provider, please page the [facility] (Director on Call) for the Hospitalists listed on amion for assistance.   04/22/2020, 5:45 PM

## 2020-04-22 NOTE — ED Notes (Signed)
Date and time results received: 04/22/20    Test: HgB Critical Value: 4.0  Name of Provider Notified: Marijean Bravo PA

## 2020-04-22 NOTE — Telephone Encounter (Signed)
Richardson Dopp, PA recommends patient go to ED based on CBC results.  I spoke with patient and reviewed results with her.  Patient made aware she needs to go to ED now.  She will have daughter take her.

## 2020-04-22 NOTE — ED Provider Notes (Signed)
[facility] EMERGENCY DEPARTMENT Provider Note   CSN: [id number] Arrival date & time: 04/22/20  1154     History Chief Complaint  Patient presents with  . Abnormal Lab    [name] [name] is a 64 y.o. female.  [name] [name] is a 64 y.o. female with a history of MI, ischemic cardiomyopathy with ICD in place, GERD, dyslipidemia, who presents to the emergency department from her primary care office for evaluation of low hemoglobin.  Patient was seen by cardiology PA yesterday for evaluation of fatigue, lightheadedness and exertional dyspnea and they did lab work, and were concerned for anemia, they called her back today because her hemoglobin came back at 4.0 and instructed her to go to the emergency department.  Patient states that for the past 3 weeks she has been feeling very fatigued, she has had some lightheadedness and dizziness but has not passed out.  She denies any chest pain or palpitations.  She reports that she is just felt like she had no energy and has had some exertional dyspnea.  She states she has been doing well with her heart failure medications, reports very mild edema to her lower extremities.  She denies any associated abdominal pain, nausea or vomiting.  No blood in the stool or dark tarry stool.  No history of GI bleeding.  Patient takes aspirin but is not on any blood thinners.        Past Medical History:  Diagnosis Date  . Cervical disc disease   . Depression   . Dyslipidemia   . GERD (gastroesophageal reflux disease)   . ICD (implantable cardiac defibrillator), single MDT    treated with BMS to LAD  . Ischemic cardiomyopathy    post-MI LVEF less than 30%; acute AMI Rx w BMS>>LAD    Patient Active Problem List   Diagnosis Date Noted  . VT (ventricular tachycardia) ([place]) 12/01/2019  . SVT (supraventricular tachycardia) ([place]) 11/13/2012  . Dual implantable cardioverter-defibrillator in situ   . Ischemic cardiomyopathy   . SYSTOLIC HEART  FAILURE, CHRONIC 10/19/2010  . FATIGUE 10/19/2010  . CAD, NATIVE VESSEL 01/11/2010  . Hyperlipidemia with target low density lipoprotein (LDL) cholesterol less than 70 mg/dL 08/05/2009  . DEPRESSION 08/05/2009  . GASTROESOPHAGEAL REFLUX DISEASE 08/05/2009  . [place] DISEASE, CERVICAL 08/05/2009    Past Surgical History:  Procedure Laterality Date  . CERVICAL DISC SURGERY    . CORONARY ANGIOPLASTY WITH STENT PLACEMENT    . ICD GENERATOR CHANGEOUT N/A 11/17/2017   Procedure: ICD GENERATOR CHANGEOUT;  Surgeon: Deboraha Sprang, MD;  Location: [facility];  Service: Cardiovascular;  Laterality: N/A;  . icd implanted    . partial right masectomy       OB History   No obstetric history on file.     Family History  Problem Relation Age of Onset  . Heart attack Mother     Social History   Tobacco Use  . Smoking status: Former Smoker    Packs/day: 0.50    Years: 10.00    Pack years: 5.00  . Smokeless tobacco: Never Used  . Tobacco comment: smoked 1/2 ppd for at least 10 years-quit smoking at the time of her MI  Vaping Use  . Vaping Use: Never used  Substance Use Topics  . Alcohol use: No  . Drug use: No    Home Medications Prior to Admission medications   Medication Sig Start Date End Date Taking? Authorizing Provider  acetaminophen (TYLENOL)  325 MG tablet Take 650 mg by mouth every 6 (six) hours as needed (FOR PAIN.).     [provider]  aspirin EC 81 MG EC tablet Take 1 tablet (81 mg total) by mouth daily. 05/02/11   Sherren Mocha, MD  DULoxetine (CYMBALTA) 60 MG capsule Take 60 mg by mouth daily.  11/25/14   [provider]  furosemide (LASIX) 20 MG tablet TAKE 1 TABLET BY MOUTH EVERY OTHER DAY 12/11/19   Sherren Mocha, MD  metoprolol tartrate (LOPRESSOR) 50 MG tablet Take 0.5 tablets (25 mg total) by mouth 2 (two) times daily. 04/14/20   Supple, Megan E, RPH-CPP  nitroGLYCERIN (NITROSTAT) 0.4 MG SL tablet Place 0.4 mg under the tongue every 5 (five)  minutes x 3 doses as needed for chest pain.     [provider]  pantoprazole (PROTONIX) 40 MG tablet Take 1 tablet (40 mg total) by mouth daily. 07/24/19   Sherren Mocha, MD  potassium chloride SA (KLOR-CON) 20 MEQ tablet TAKE 2 TABLETS(40 MEQ) BY MOUTH DAILY 09/16/19   Sherren Mocha, MD  rosuvastatin (CRESTOR) 40 MG tablet Take 1 tablet (40 mg total) by mouth daily. 08/12/19   Sherren Mocha, MD    Allergies    Patient has no known allergies.  Review of Systems   Review of Systems  Constitutional: Positive for fatigue. Negative for chills and fever.  HENT: Negative.   Respiratory: Positive for shortness of breath (With exertion). Negative for cough.   Cardiovascular: Negative for chest pain and leg swelling.  Gastrointestinal: Negative for abdominal pain, blood in stool, nausea and vomiting.  Genitourinary: Negative for dysuria, frequency, vaginal bleeding and vaginal discharge.  Musculoskeletal: Negative for arthralgias and myalgias.  Skin: Negative for color change and rash.  Neurological: Positive for dizziness and light-headedness. Negative for syncope.    Physical Exam Updated Vital Signs BP (!) 105/55 (BP Location: Left Arm)   Pulse 80   Temp 98.9 F (37.2 C)   Resp 20   Ht 5\' 4"  (1.626 m)   Wt 67 kg   SpO2 100%   BMI 25.35 kg/m   Physical Exam Vitals and nursing note reviewed.  Constitutional:      General: She is not in acute distress.    Appearance: Normal appearance. She is well-developed and normal weight. She is ill-appearing. She is not diaphoretic.     Comments: Patient is pale and somewhat ill-appearing but in no acute distress  HENT:     Head: Normocephalic and atraumatic.     Mouth/Throat:     Mouth: Mucous membranes are moist.     Pharynx: Oropharynx is clear.  Eyes:     General:        Right eye: No discharge.        Left eye: No discharge.     Comments: Conjunctivae pale  Cardiovascular:     Rate and Rhythm: Normal rate and  regular rhythm.     Heart sounds: Normal heart sounds.  Pulmonary:     Effort: Pulmonary effort is normal. No respiratory distress.     Breath sounds: Normal breath sounds. No wheezing or rales.     Comments: Respirations equal and unlabored, patient able to speak in full sentences, lungs clear to auscultation bilaterally Abdominal:     General: Bowel sounds are normal. There is no distension.     Palpations: Abdomen is soft. There is no mass.     Tenderness: There is no abdominal tenderness. There is no guarding.  Comments: Abdomen soft, nondistended, nontender to palpation in all quadrants without guarding or peritoneal signs  Genitourinary:    Comments: Chaperone present, soft brown stool present on rectal exam,  no hemorrhoids or bright red blood noted no melena Musculoskeletal:        General: No deformity.     Cervical back: Neck supple.  Skin:    General: Skin is warm and dry.     Capillary Refill: Capillary refill takes less than 2 seconds.     Coloration: Skin is pale.  Neurological:     Mental Status: She is alert.     Coordination: Coordination normal.     Comments: Speech is clear, able to follow commands Moves extremities without ataxia, coordination intact  Psychiatric:        Mood and Affect: Mood normal.        Behavior: Behavior normal.     ED Results / Procedures / Treatments   Labs (all labs ordered are listed, but only abnormal results are displayed) Labs Reviewed  COMPREHENSIVE METABOLIC PANEL - Abnormal; Notable for the following components:      Result Value   Chloride 112 (*)    CO2 16 (*)    Glucose, Bld 115 (*)    Creatinine, Ser 2.28 (*)    GFR calc non Af Amer 22 (*)    GFR calc Af Amer 26 (*)    All other components within normal limits  CBC - Abnormal; Notable for the following components:   RBC 1.50 (*)    Hemoglobin 4.0 (*)    HCT 14.4 (*)    MCHC 27.8 (*)    RDW 15.7 (*)    nRBC 0.4 (*)    All other components within normal limits   RETICULOCYTES - Abnormal; Notable for the following components:   Retic Ct Pct 4.9 (*)    RBC. 1.38 (*)    Immature Retic Fract 30.0 (*)    All other components within normal limits  POC OCCULT BLOOD, ED - Abnormal; Notable for the following components:   Fecal Occult Bld POSITIVE (*)    All other components within normal limits  SARS CORONAVIRUS 2 BY RT PCR (HOSPITAL ORDER, [facility])  VITAMIN B12  FOLATE  IRON AND TIBC  FERRITIN  TYPE AND SCREEN  ABO/RH  PREPARE RBC (CROSSMATCH)    EKG EKG Interpretation  Date/Time:  Wednesday April 22 2020 12:46:20 EDT Ventricular Rate:  78 PR Interval:  146 QRS Duration: 76 QT Interval:  360 QTC Calculation: 410 R Axis:   81 Text Interpretation: Normal sinus rhythm Nonspecific ST abnormality Abnormal ECG When compared to prior, no significant changes seen. No STEMI Confirmed by Antony Blackbird [id number]) on 04/22/2020 1:24:37 PM   Radiology No results found.  Procedures .Critical Care Performed by: Jacqlyn Larsen, PA-C Authorized by: Jacqlyn Larsen, PA-C   Critical care provider statement:    Critical care time (minutes):  45   Critical care was necessary to treat or prevent imminent or life-threatening deterioration of the following conditions:  Circulatory failure (Hgb 4)   Critical care was time spent personally by me on the following activities:  Discussions with consultants, evaluation of patient's response to treatment, examination of patient, ordering and performing treatments and interventions, ordering and review of laboratory studies, ordering and review of radiographic studies, pulse oximetry, re-evaluation of patient's condition, obtaining history from patient or surrogate and review of old charts   (including critical care time)  Medications Ordered in ED Medications  pantoprazole (PROTONIX) 80 mg in sodium chloride 0.9 % 100 mL (0.8 mg/mL) infusion (has no administration in time range)    pantoprazole (PROTONIX) injection 40 mg (has no administration in time range)  0.9 %  sodium chloride infusion (has no administration in time range)  polyethylene glycol-electrolytes (NuLYTELY) solution 4,000 mL (has no administration in time range)  0.9 %  sodium chloride infusion (10 mL/hr Intravenous New Bag/Given 04/22/20 1459)  pantoprazole (PROTONIX) 80 mg in sodium chloride 0.9 % 100 mL IVPB (0 mg Intravenous Stopped 04/22/20 1522)    ED Course  I have reviewed the triage vital signs and the nursing notes.  Pertinent labs & imaging results that were available during my care of the patient were reviewed by me and considered in my medical decision making (see chart for details).    MDM Rules/Calculators/A&P                          64 year old female sent from PCP for evaluation of low hemoglobin.  Hemoglobin here confirmed to be 4.0, patient appears pale, has not had any noted bleeding symptoms, and on exam does not have any bright red blood per rectum or melena noted.  She has not had any vaginal bleeding, I suspect that she may have a slow GI bleed because despite significantly low hemoglobin she is not tachycardic or hypotensive and appears to be compensating well.    Hemoccult is positive.  Creatinine is a bit worse than previous, was most recently 1.95, today is 2.28, BUN is not elevated.  CO2 of 16 but no other significant electrolyte derangements. Patient with EF of 20 and will likely need diuretics with blood transfusion, will avoid giving any fluids.  Ordered 2 units blood transfusion for patient we will also start patient on IV Protonix given that we are not sure where GI bleed is from at this point.  No blood thinners.  Will consult GI and patient will require hospitalist admission.  Case discussed with Nelda Severe PA with GI, they will see the patient shortly in consult, agree with transfusion and IV Protonix.  Case discussed with Dr. Lorin Mercy with [facility] who will  see and admit the patient.  Final Clinical Impression(s) / ED Diagnoses Final diagnoses:  Symptomatic anemia  Occult GI bleeding    Rx / DC Orders ED Discharge Orders    None       Jacqlyn Larsen, [place] 04/22/20 1636    Tegeler, Gwenyth Allegra, MD 04/23/20 1137

## 2020-04-22 NOTE — ED Triage Notes (Signed)
Pt sent here by PCP for low Hg 4, pt feeling dizzy and with a HA. Denies any bloody stool or urine.

## 2020-04-22 NOTE — Progress Notes (Signed)
Patient arrived to unit from ED. Report received from [place], Therapist, sports. Patient alert and oriented x4. Belongings at bedside. Oriented to room. Call bell within reach. Will continue to monitor.

## 2020-04-22 NOTE — Progress Notes (Signed)
Hemoglobin 5.7, hematocrit 19.8 at 2205 tonight.  Given 2 units PRBC Recheck H/H 2 hours after transfusions.

## 2020-04-23 ENCOUNTER — Telehealth: Payer: Self-pay | Admitting: *Deleted

## 2020-04-23 ENCOUNTER — Telehealth: Payer: Self-pay | Admitting: Cardiovascular Disease

## 2020-04-23 DIAGNOSIS — D649 Anemia, unspecified: Secondary | ICD-10-CM

## 2020-04-23 LAB — BASIC METABOLIC PANEL
Anion gap: 9 (ref 5–15)
BUN: 12 mg/dL (ref 8–23)
CO2: 18 mmol/L — ABNORMAL LOW (ref 22–32)
Calcium: 8.7 mg/dL — ABNORMAL LOW (ref 8.9–10.3)
Chloride: 113 mmol/L — ABNORMAL HIGH (ref 98–111)
Creatinine, Ser: 1.83 mg/dL — ABNORMAL HIGH (ref 0.44–1.00)
GFR calc Af Amer: 34 mL/min — ABNORMAL LOW (ref >60)
GFR calc non Af Amer: 29 mL/min — ABNORMAL LOW (ref >60)
Glucose, Bld: 92 mg/dL (ref 70–99)
Potassium: 3.6 mmol/L (ref 3.5–5.1)
Sodium: 140 mmol/L (ref 135–145)

## 2020-04-23 LAB — CBC
HCT: 23 % — ABNORMAL LOW (ref 36.0–46.0)
Hemoglobin: 7.1 g/dL — ABNORMAL LOW (ref 12.0–15.0)
MCH: 27.7 pg (ref 26.0–34.0)
MCHC: 30.9 g/dL (ref 30.0–36.0)
MCV: 89.8 fL (ref 80.0–100.0)
Platelets: 200 10*3/uL (ref 150–400)
RBC: 2.56 MIL/uL — ABNORMAL LOW (ref 3.87–5.11)
RDW: 15.6 % — ABNORMAL HIGH (ref 11.5–15.5)
WBC: 3.9 10*3/uL — ABNORMAL LOW (ref 4.0–10.5)
nRBC: 0 % (ref 0.0–0.2)

## 2020-04-23 LAB — HEMOGLOBIN AND HEMATOCRIT, BLOOD
HCT: 26.1 % — ABNORMAL LOW (ref 36.0–46.0)
Hemoglobin: 8.2 g/dL — ABNORMAL LOW (ref 12.0–15.0)

## 2020-04-23 LAB — PREPARE RBC (CROSSMATCH)

## 2020-04-23 MED ORDER — SODIUM CHLORIDE 0.9% IV SOLUTION: Freq: Once | INTRAVENOUS | Status: AC

## 2020-04-23 MED ORDER — ACETAMINOPHEN 325 MG PO TABS
650.0000 mg | ORAL_TABLET | Freq: Once | ORAL | Status: AC
  Administered 2020-04-23: 650 mg via ORAL
  Filled 2020-04-23: qty 2

## 2020-04-23 NOTE — Plan of Care (Signed)
  Problem: Education: Goal: Knowledge of General Education information will improve Description: Including pain rating scale, medication(s)/side effects and non-pharmacologic comfort measures Outcome: Progressing   Problem: Health Behavior/Discharge Planning: Goal: Ability to manage health-related needs will improve Outcome: Progressing   Problem: Clinical Measurements: Goal: Ability to maintain clinical measurements within normal limits will improve Outcome: Progressing Goal: Diagnostic test results will improve Outcome: Progressing Goal: Respiratory complications will improve Outcome: Progressing Goal: Cardiovascular complication will be avoided Outcome: Progressing   Problem: Activity: Goal: Risk for activity intolerance will decrease Outcome: Progressing   Problem: Pain Managment: Goal: General experience of comfort will improve Outcome: Progressing   Problem: Safety: Goal: Ability to remain free from injury will improve Outcome: Progressing   Problem: Skin Integrity: Goal: Risk for impaired skin integrity will decrease Outcome: Progressing

## 2020-04-23 NOTE — Progress Notes (Signed)
[facility] Progress Note  CYENNA REBELLO 64 y.o. [birth year]  CC:  Severe anemia, heme-positive stools  Subjective: Patient reports feeling tired this morning but otherwise feels well.  Denies any abdominal pain, nausea, vomiting, melena, or hematochezia.  ROS : Review of Systems  Constitutional: Positive for malaise/fatigue.  Respiratory: Negative for cough and shortness of breath.   Cardiovascular: Negative for chest pain and palpitations.  Gastrointestinal: Negative for abdominal pain, blood in stool, constipation, diarrhea, heartburn, melena, nausea and vomiting.   Objective: Vital signs in last 24 hours: Vitals:   04/23/20 1026 04/23/20 1041  BP: 113/65 (!) 112/57  Pulse: 75 75  Resp: 18 18  Temp: 98.3 F (36.8 C) 98.4 F (36.9 C)  SpO2: 96% 96%    Physical Exam:  General:  Lethargic, oriented, cooperative, no acute distress  Head:  Normocephalic, without obvious abnormality, atraumatic  Eyes:  Conjunctival pallor, EOMs intact  Lungs:   Clear to auscultation bilaterally, respirations unlabored  Heart:  Regular rate and rhythm, S1, S2 normal  Abdomen:   Soft, non-tender, bowel sounds active all four quadrants,  no guarding or peritoneal signs   Extremities: Extremities normal, atraumatic, no  edema  Pulses: 2+ and symmetric    Lab Results: Recent Labs    04/22/20 1325 04/23/20 0751  NA 135 140  K 3.6 3.6  CL 112* 113*  CO2 16* 18*  GLUCOSE 115* 92  BUN 16 12  CREATININE 2.28* 1.83*  CALCIUM 8.9 8.7*   Recent Labs    04/21/20 1503 04/22/20 1325  AST 20 26  ALT 36* 35  ALKPHOS 124* 87  BILITOT <0.2 0.6  PROT 6.1 7.0  ALBUMIN 4.0 3.9   Recent Labs    04/22/20 2205 04/23/20 0751  WBC 5.3 3.9*  HGB 5.7* 7.1*  HCT 19.8* 23.0*  MCV 92.1 89.8  PLT 245 200   No results for input(s): LABPROT, INR in the last 72 hours.   Assessment: Severe anemia with heme-positive stools -Hgb 4.0 on arrival yesterday, 7.1 this morning after 3u.  A  fourth unit is currently transfusing.  Plan: EGD and colonoscopy tomorrow.  Clear liquid diet with NuLYTELY prep this afternoon.  NPO after midnight.  Continue Protonix 40 mg IV BID.  Continue to monitor H&H with transfusion needed to maintain Hgb >7.  [facility] will follow.  Salley Slaughter PA-C 04/23/2020, 12:32 PM  Contact #  [phone]

## 2020-04-23 NOTE — Telephone Encounter (Signed)
Currently admitted to [facility] for symptomatic anemia.

## 2020-04-23 NOTE — H&P (View-Only) (Signed)
[facility] Progress Note  [name] [name] 64 y.o. 12-05-1955  CC:  Severe anemia, heme-positive stools  Subjective: Patient reports feeling tired this morning but otherwise feels well.  Denies any abdominal pain, nausea, vomiting, melena, or hematochezia.  ROS : Review of Systems  Constitutional: Positive for malaise/fatigue.  Respiratory: Negative for cough and shortness of breath.   Cardiovascular: Negative for chest pain and palpitations.  Gastrointestinal: Negative for abdominal pain, blood in stool, constipation, diarrhea, heartburn, melena, nausea and vomiting.   Objective: Vital signs in last 24 hours: Vitals:   04/23/20 1026 04/23/20 1041  BP: 113/65 (!) 112/57  Pulse: 75 75  Resp: 18 18  Temp: 98.3 F (36.8 C) 98.4 F (36.9 C)  SpO2: 96% 96%    Physical Exam:  General:  Lethargic, oriented, cooperative, no acute distress  Head:  Normocephalic, without obvious abnormality, atraumatic  Eyes:  Conjunctival pallor, EOMs intact  Lungs:   Clear to auscultation bilaterally, respirations unlabored  Heart:  Regular rate and rhythm, S1, S2 normal  Abdomen:   Soft, non-tender, bowel sounds active all four quadrants,  no guarding or peritoneal signs   Extremities: Extremities normal, atraumatic, no  edema  Pulses: 2+ and symmetric    Lab Results: Recent Labs    04/22/20 1325 04/23/20 0751  NA 135 140  K 3.6 3.6  CL 112* 113*  CO2 16* 18*  GLUCOSE 115* 92  BUN 16 12  CREATININE 2.28* 1.83*  CALCIUM 8.9 8.7*   Recent Labs    04/21/20 1503 04/22/20 1325  AST 20 26  ALT 36* 35  ALKPHOS 124* 87  BILITOT <0.2 0.6  PROT 6.1 7.0  ALBUMIN 4.0 3.9   Recent Labs    04/22/20 2205 04/23/20 0751  WBC 5.3 3.9*  HGB 5.7* 7.1*  HCT 19.8* 23.0*  MCV 92.1 89.8  PLT 245 200   No results for input(s): LABPROT, INR in the last 72 hours.   Assessment: Severe anemia with heme-positive stools -Hgb 4.0 on arrival yesterday, 7.1 this morning after 3u.  A  fourth unit is currently transfusing.  Plan: EGD and colonoscopy tomorrow.  Clear liquid diet with NuLYTELY prep this afternoon.  NPO after midnight.  Continue Protonix 40 mg IV BID.  Continue to monitor H&H with transfusion needed to maintain Hgb >7.  [facility] will follow.  Salley Slaughter PA-C 04/23/2020, 12:32 PM  Contact #  [phone]

## 2020-04-23 NOTE — Progress Notes (Addendum)
PROGRESS NOTE    [name] [name]  FXT:[id number] DOB: 1956-03-13 DOA: 04/22/2020 PCP: Redmond School, MD    Brief Narrative:  [name] [name] is a 64 y.o. female with medical history significant of chronic systolic CHF with AICD placement; CAD s/p stent placement; and HLD presenting with symptomatic anemia.  About 3 weeks ago, she noticed cramps in her left leg.  She called Dr. Gerarda Fraction and he told her to take flax seed oil and that didn't help the cramps.  She started with headaches and dizziness and called cardiology.  They stopped Losartan and Coreg and started metoprolol 50 mg BID.  It continued and she developed nausea and so they decreased the dose to 25 mg BID.  Yesterday, she went to see PA Kathlen Mody at cardiology and he did blood work.  A nurse called this AM and said her Hgb was critically low and 2 other things were abnormal and she should come to the ER.  +SOB with significant exertion.  Dizziness and headache is when she moves around.  +nausea without vomiting, no diarrhea.  10 pounds unintentional weight loss in 2 weeks.  +chronic night sweats, unchanged.    Consultants:   GI  Procedures:   Antimicrobials:       Subjective: Feels better this AM.  Denies any dizziness, fatigue, shortness  Objective: Vitals:   04/23/20 0555 04/23/20 1026 04/23/20 1041 04/23/20 1338  BP: (!) 100/56 113/65 (!) 112/57 109/68  Pulse: 89 75 75 68  Resp: 18 18 18 17   Temp: 98.2 F (36.8 C) 98.3 F (36.8 C) 98.4 F (36.9 C) 97.6 F (36.4 C)  TempSrc: Oral Oral Oral Oral  SpO2: 95% 96% 96% 98%  Weight:      Height:        Intake/Output Summary (Last 24 hours) at 04/23/2020 1539 Last data filed at 04/23/2020 1338 Gross per 24 hour  Intake 3500.81 ml  Output --  Net 3500.81 ml   Filed Weights   04/22/20 1243 04/22/20 2044  Weight: 67 kg 66.4 kg    Examination:  General exam: Appears calm and comfortable  Respiratory system: Clear to auscultation. Respiratory effort normal. Cardiovascular  system: S1 & S2 heard, RRR. No JVD, murmurs, rubs, gallops or clicks.  Gastrointestinal system: Abdomen is nondistended, soft and nontender. Normal bowel sounds heard. Central nervous system: Alert and oriented.  Grossly intact Extremities: No edema Skin: Warm dry Psychiatry: Judgement and insight appear normal. Mood & affect appropriate.     Data Reviewed: I have personally reviewed following labs and imaging studies  CBC: Recent Labs  Lab 04/21/20 1503 04/22/20 1325 04/22/20 2205 04/23/20 0751  WBC 7.3 5.1 5.3 3.9*  HGB 4.0* 4.0* 5.7* 7.1*  HCT 13.8* 14.4* 19.8* 23.0*  MCV 91 96.0 92.1 89.8  PLT 302 290 245 299   Basic Metabolic Panel: Recent Labs  Lab 04/21/20 1503 04/22/20 1325 04/23/20 0751  NA 135 135 140  K 5.3* 3.6 3.6  CL 105 112* 113*  CO2 16* 16* 18*  GLUCOSE 105* 115* 92  BUN 15 16 12   CREATININE 1.95* 2.28* 1.83*  CALCIUM 9.1 8.9 8.7*   GFR: Estimated Creatinine Clearance: 29.9 mL/min (A) (by C-G formula based on SCr of 1.83 mg/dL (H)). Liver Function Tests: Recent Labs  Lab 04/21/20 1503 04/22/20 1325  AST 20 26  ALT 36* 35  ALKPHOS 124* 87  BILITOT <0.2 0.6  PROT 6.1 7.0  ALBUMIN 4.0 3.9   No results for input(s): LIPASE, AMYLASE  in the last 168 hours. No results for input(s): AMMONIA in the last 168 hours. Coagulation Profile: No results for input(s): INR, PROTIME in the last 168 hours. Cardiac Enzymes: No results for input(s): CKTOTAL, CKMB, CKMBINDEX, TROPONINI in the last 168 hours. BNP (last 3 results) No results for input(s): PROBNP in the last 8760 hours. HbA1C: No results for input(s): HGBA1C in the last 72 hours. CBG: No results for input(s): GLUCAP in the last 168 hours. Lipid Profile: No results for input(s): CHOL, HDL, LDLCALC, TRIG, CHOLHDL, LDLDIRECT in the last 72 hours. Thyroid Function Tests: Recent Labs    04/21/20 1503  TSH 4.080   Anemia Panel: Recent Labs    04/22/20 1416  VITAMINB12 261  FOLATE 17.9    FERRITIN 29  TIBC 419  IRON 9*  RETICCTPCT 4.9*   Sepsis Labs: No results for input(s): PROCALCITON, LATICACIDVEN in the last 168 hours.  Recent Results (from the past 240 hour(s))  SARS Coronavirus 2 by RT PCR (hospital order, performed in [facility] hospital lab) Nasopharyngeal Nasopharyngeal Swab     Status: None   Collection Time: 04/22/20  3:22 PM   Specimen: Nasopharyngeal Swab  Result Value Ref Range Status   SARS Coronavirus 2 NEGATIVE NEGATIVE Final    Comment: (NOTE) SARS-CoV-2 target nucleic acids are NOT DETECTED.  The SARS-CoV-2 RNA is generally detectable in upper and lower respiratory specimens during the acute phase of infection. The lowest concentration of SARS-CoV-2 viral copies this assay can detect is 250 copies / mL. A negative result does not preclude SARS-CoV-2 infection and should not be used as the sole basis for treatment or other patient management decisions.  A negative result may occur with improper specimen collection / handling, submission of specimen other than nasopharyngeal swab, presence of viral mutation(s) within the areas targeted by this assay, and inadequate number of viral copies (<250 copies / mL). A negative result must be combined with clinical observations, patient history, and epidemiological information.  Fact Sheet for Patients:   StrictlyIdeas.no  Fact Sheet for Healthcare Providers: BankingDealers.co.za  This test is not yet approved or  cleared by the [place] FDA and has been authorized for detection and/or diagnosis of SARS-CoV-2 by FDA under an Emergency Use Authorization (EUA).  This EUA will remain in effect (meaning this test can be used) for the duration of the COVID-19 declaration under Section 564(b)(1) of the Act, 21 U.S.C. section 360bbb-3(b)(1), unless the authorization is terminated or revoked sooner.  Performed at [facility], [place] [address],  [place], [place] [address]          Radiology Studies: No results found.      Scheduled Meds: . DULoxetine  60 mg Oral Daily  . furosemide  20 mg Intravenous Once  . [START ON 04/26/2020] pantoprazole  40 mg Intravenous Q12H  . polyethylene glycol-electrolytes  4,000 mL Oral Once  . rosuvastatin  40 mg Oral QHS  . sodium chloride flush  3 mL Intravenous Q12H   Continuous Infusions: . sodium chloride Stopped (04/22/20 1705)  . lactated ringers 75 mL/hr at 04/23/20 0901  . pantoprozole (PROTONIX) infusion 8 mg/hr (04/23/20 9357)    Assessment & Plan:   Principal Problem:   Symptomatic anemia Active Problems:   Hyperlipidemia with target low density lipoprotein (LDL) cholesterol less than 70 mg/dL   CAD, NATIVE VESSEL   SYSTOLIC HEART FAILURE, CHRONIC   Dual implantable cardioverter-defibrillator in situ   Acute renal failure superimposed on stage 3b chronic  kidney disease ([place])   Symptomatic anemia -Patient without prior Gi evaluation in her lifetime, presenting with progressive SOB and dizziness with exertion -Hgb 4  -Heme positive, no report of abnormal stools -Normal BUN, likely lower source - concerning for colon malignancy -Hgb 4.0; normal MCV - appears to be more c/w anemia of chronic disease Continue telemetry -Transfused 2 units PRBC Hg 7.1 this am, will give another unit of packed red blood cells today Monitor volume status as patient has history of CHF -GI consulted plan for colonoscopy and EGD in a.m. Continue PPI IV Hold aspirin    AKI on stage 3b CKD -Baseline creatinine is roughly 1.5.   Creatinine improved after transfusion. Today 1.83 -Likely due to prerenal failure secondary to dehydration and continuation of diruetic, and anemia -Hold diuretics for now but may need to use it as as needed if giving transfusion  Avoid nephrotoxic agents  Monitor labs closely    Chronic systolic CHF with defibrillator in place; h/o CAD -s/p AICD, stent  placement -Hold ASA for now -Normalization of EF on most recent  (07/2019) echo Monitor volume status  HTN -Hold Lopressor given currently low BP Monitor BP closely once improved and can reinstitute  HLD Continue Crestor  Depression -Continue Cymbalta   DVT prophylaxis: SCD Code Status: Full Family Communication: None at bedside Disposition Plan: Home Status is: Inpatient  Remains inpatient appropriate because:Ongoing diagnostic testing needed not appropriate for outpatient work up   Dispo: The patient is from: Home              Anticipated d/c is to: Home              Anticipated d/c date is: 2 days              Patient currently is not medically stable to d/c.            LOS: 1 day   Time spent: 45 minutes with more than 50% on coc    Nolberto Hanlon, MD [facility] Pager [phone]  If 7PM-7AM, please contact night-coverage [email or web address] Password [facility] 04/23/2020, 3:39 PM

## 2020-04-23 NOTE — Telephone Encounter (Signed)
LMOVM requesting call back from pt. Direct DC number and office hours provided. Pt currently admitted at [facility] for symptomatic anemia per notes.   Non-billable ICD transmission received 8/4 showing 4 NSVT episodes and 1 VT episode on 03/14/20 at 21:43, terminated with ATP x3. EGM appears VT vs SVT based on morphology, able to see onset on stored VT-NS episode plot, Wavelet initially withheld therapy with template match. On metoprolol.   Sent to Dr. Caryl Comes for review and recommendations. Will continue trying to reach pt to discuss episode.

## 2020-04-23 NOTE — Telephone Encounter (Signed)
     Santiago Glad from [employer] for critical lab results

## 2020-04-23 NOTE — Telephone Encounter (Signed)
Spoke with patient. She cannot recall any symptoms with episode. Reports compliance with metoprolol. Advised of [place] DMV driving restrictions x6 months. Will await recommendations from Dr. Caryl Comes. Pt verbalizes understanding and agreement with plan.

## 2020-04-24 ENCOUNTER — Other Ambulatory Visit (HOSPITAL_COMMUNITY): Admission: RE | Admit: 2020-04-24 | Payer: Medicare PPO | Source: Ambulatory Visit

## 2020-04-24 ENCOUNTER — Inpatient Hospital Stay (HOSPITAL_COMMUNITY): Payer: Medicare PPO | Admitting: Anesthesiology

## 2020-04-24 ENCOUNTER — Encounter (HOSPITAL_COMMUNITY)
Admission: EM | Disposition: A | Discharge status: Home / Self Care | Payer: Self-pay | Source: Home / Self Care | Attending: Internal Medicine

## 2020-04-24 ENCOUNTER — Encounter (HOSPITAL_COMMUNITY): Payer: Self-pay | Admitting: Internal Medicine

## 2020-04-24 HISTORY — PX: BIOPSY: SHX5522

## 2020-04-24 HISTORY — PX: ESOPHAGOGASTRODUODENOSCOPY (EGD) WITH PROPOFOL: SHX5813

## 2020-04-24 HISTORY — PX: HOT HEMOSTASIS: SHX5433

## 2020-04-24 HISTORY — PX: POLYPECTOMY: SHX5525

## 2020-04-24 HISTORY — PX: SUBMUCOSAL TATTOO INJECTION: SHX6856

## 2020-04-24 HISTORY — PX: COLONOSCOPY WITH PROPOFOL: SHX5780

## 2020-04-24 LAB — BPAM RBC
Blood Product Expiration Date: 202108072359
Blood Product Expiration Date: 202108312359
Blood Product Expiration Date: 202108312359
Blood Product Expiration Date: 202108312359
ISSUE DATE / TIME: 202108041813
ISSUE DATE / TIME: 202108042228
ISSUE DATE / TIME: 202108050305
ISSUE DATE / TIME: 202108051018
Unit Type and Rh: 5100
Unit Type and Rh: 5100
Unit Type and Rh: 5100
Unit Type and Rh: 9500

## 2020-04-24 LAB — BASIC METABOLIC PANEL
Anion gap: 11 (ref 5–15)
BUN: 10 mg/dL (ref 8–23)
CO2: 18 mmol/L — ABNORMAL LOW (ref 22–32)
Calcium: 8.7 mg/dL — ABNORMAL LOW (ref 8.9–10.3)
Chloride: 113 mmol/L — ABNORMAL HIGH (ref 98–111)
Creatinine, Ser: 1.76 mg/dL — ABNORMAL HIGH (ref 0.44–1.00)
GFR calc Af Amer: 35 mL/min — ABNORMAL LOW (ref >60)
GFR calc non Af Amer: 30 mL/min — ABNORMAL LOW (ref >60)
Glucose, Bld: 93 mg/dL (ref 70–99)
Potassium: 3.4 mmol/L — ABNORMAL LOW (ref 3.5–5.1)
Sodium: 142 mmol/L (ref 135–145)

## 2020-04-24 LAB — TYPE AND SCREEN
ABO/RH(D): O POS
Antibody Screen: NEGATIVE
Unit division: 0
Unit division: 0
Unit division: 0
Unit division: 0

## 2020-04-24 LAB — CBC
HCT: 26.6 % — ABNORMAL LOW (ref 36.0–46.0)
Hemoglobin: 8.4 g/dL — ABNORMAL LOW (ref 12.0–15.0)
MCH: 27.5 pg (ref 26.0–34.0)
MCHC: 31.6 g/dL (ref 30.0–36.0)
MCV: 86.9 fL (ref 80.0–100.0)
Platelets: 181 10*3/uL (ref 150–400)
RBC: 3.06 MIL/uL — ABNORMAL LOW (ref 3.87–5.11)
RDW: 15.9 % — ABNORMAL HIGH (ref 11.5–15.5)
WBC: 4.2 10*3/uL (ref 4.0–10.5)
nRBC: 0.7 % — ABNORMAL HIGH (ref 0.0–0.2)

## 2020-04-24 SURGERY — ESOPHAGOGASTRODUODENOSCOPY (EGD) WITH PROPOFOL
Anesthesia: Monitor Anesthesia Care

## 2020-04-24 MED ORDER — SPOT INK MARKER SYRINGE KIT
PACK | SUBMUCOSAL | Status: DC | PRN
  Administered 2020-04-24: 2 mL via SUBMUCOSAL

## 2020-04-24 MED ORDER — PHENYLEPHRINE HCL (PRESSORS) 10 MG/ML IV SOLN
INTRAVENOUS | Status: DC | PRN
  Administered 2020-04-24: 120 ug via INTRAVENOUS

## 2020-04-24 MED ORDER — PROPOFOL 10 MG/ML IV BOLUS
INTRAVENOUS | Status: DC | PRN
  Administered 2020-04-24: 125 ug/kg/min via INTRAVENOUS

## 2020-04-24 MED ORDER — SPOT INK MARKER SYRINGE KIT
PACK | SUBMUCOSAL | Status: AC
  Filled 2020-04-24: qty 5

## 2020-04-24 MED ORDER — PROPOFOL 500 MG/50ML IV EMUL
INTRAVENOUS | Status: DC | PRN
  Administered 2020-04-24 (×2): 20 mg via INTRAVENOUS
  Administered 2020-04-24: 40 mg via INTRAVENOUS
  Administered 2020-04-24: 10 mg via INTRAVENOUS

## 2020-04-24 MED ORDER — LACTATED RINGERS IV SOLN: INTRAVENOUS | Status: DC | PRN

## 2020-04-24 MED ORDER — LIDOCAINE HCL (CARDIAC) PF 100 MG/5ML IV SOSY
PREFILLED_SYRINGE | INTRAVENOUS | Status: DC | PRN
  Administered 2020-04-24: 100 mg via INTRATRACHEAL

## 2020-04-24 SURGICAL SUPPLY — 25 items

## 2020-04-24 NOTE — Op Note (Signed)
[facility] Patient Name: [name] [name] Procedure Date : 04/24/2020 MRN: [id number] Attending MD: Lear Ng , MD Date of Birth: [birth year] CSN: [id number] Age: 64 Admit Type: Inpatient Procedure:                Upper GI endoscopy Indications:              Iron deficiency anemia, Heme positive stool Providers:                Lear Ng, MD, Glori Bickers, RN, Lazaro Arms, Technician, Tyrone Apple, Technician,                            Gershon Crane CRNA, CRNA Referring MD:             hospital team Medicines:                Propofol per Anesthesia, Monitored Anesthesia Care Complications:            No immediate complications. Estimated Blood Loss:     Estimated blood loss: none. Procedure:                Pre-Anesthesia Assessment:                           - Prior to the procedure, a History and Physical                            was performed, and patient medications and                            allergies were reviewed. The patient's tolerance of                            previous anesthesia was also reviewed. The risks                            and benefits of the procedure and the sedation                            options and risks were discussed with the patient.                            All questions were answered, and informed consent                            was obtained. Prior Anticoagulants: The patient has                            taken no previous anticoagulant or antiplatelet                            agents. ASA Grade Assessment: III - A patient with  severe systemic disease. After reviewing the risks                            and benefits, the patient was deemed in                            satisfactory condition to undergo the procedure.                           After obtaining informed consent, the endoscope was                            passed under direct vision.  Throughout the                            procedure, the patient's blood pressure, pulse, and                            oxygen saturations were monitored continuously. The                            GIF-H190 ([id number]) Olympus gastroscope was                            introduced through the mouth, and advanced to the                            second part of duodenum. The upper GI endoscopy was                            accomplished without difficulty. The patient                            tolerated the procedure well. Scope In: Scope Out: Findings:      A widely patent and non-obstructing Schatzki ring was found at the       gastroesophageal junction.      A few 6 mm angiodysplastic lesions with no bleeding were found in the       gastric antrum. Fulguration to ablate the lesion by argon plasma was       successful. Estimated blood loss: none.      A medium-sized hiatal hernia was present.      The examined duodenum was normal. Impression:               - Widely patent and non-obstructing Schatzki ring.                           - A few non-bleeding angiodysplastic lesions in the                            stomach. Treated with argon plasma coagulation                            (APC).                           -  Medium-sized hiatal hernia.                           - Normal examined duodenum.                           - No specimens collected. Recommendation:           - Clear liquid diet. Procedure Code(s):        --- Professional ---                           [id number], Esophagogastroduodenoscopy, flexible,                            transoral; with ablation of tumor(s), polyp(s), or                            other lesion(s) (includes pre- and post-dilation                            and guide wire passage, when performed) Diagnosis Code(s):        --- Professional ---                           D50.9, Iron deficiency anemia, unspecified                           K31.819,  Angiodysplasia of stomach and duodenum                            without bleeding                           K22.2, Esophageal obstruction                           K44.9, Diaphragmatic hernia without obstruction or                            gangrene                           R19.5, Other fecal abnormalities CPT copyright 2019 [employer]. All rights reserved. The codes documented in this report are preliminary and upon coder review may  be revised to meet current compliance requirements. Lear Ng, MD 04/24/2020 10:48:34 AM This report has been signed electronically. Number of Addenda: 0

## 2020-04-24 NOTE — Progress Notes (Addendum)
PROGRESS NOTE    [name] [name]  QQI:[id number] DOB: 01-19-1956 DOA: 04/22/2020 PCP: Redmond School, MD    Brief Narrative:  [name] [name] is a 64 y.o. female with medical history significant of chronic systolic CHF with AICD placement; CAD s/p stent placement; and HLD presenting with symptomatic anemia.  About 3 weeks ago, she noticed cramps in her left leg.  She called Dr. Gerarda Fraction and he told her to take flax seed oil and that didn't help the cramps.  She started with headaches and dizziness and called cardiology.  They stopped Losartan and Coreg and started metoprolol 50 mg BID.  It continued and she developed nausea and so they decreased the dose to 25 mg BID.  Yesterday, she went to see PA Kathlen Mody at cardiology and he did blood work.  A nurse called this AM and said her Hgb was critically low and 2 other things were abnormal and she should come to the ER.  +SOB with significant exertion.  Dizziness and headache is when she moves around.  +nausea without vomiting, no diarrhea.  10 pounds unintentional weight loss in 2 weeks.  +chronic night sweats, unchanged.    Consultants:   GI  Procedures:   Antimicrobials:       Subjective: Has no dizziness, cp, sob, no other complaints. S/p EGD/CSCOPE  Objective: Vitals:   04/24/20 1100 04/24/20 1110 04/24/20 1143 04/24/20 1653  BP: 105/66 (!) 121/54 (!) 121/58 125/69  Pulse: 87 85  88  Resp: 19 20 18 18   Temp:   (!) 97.5 F (36.4 C) 98.1 F (36.7 C)  TempSrc:   Oral Oral  SpO2: 99% 97% 98% 100%  Weight:      Height:        Intake/Output Summary (Last 24 hours) at 04/24/2020 1729 Last data filed at 04/24/2020 1300 Gross per 24 hour  Intake 3294.65 ml  Output 916 ml  Net 2378.65 ml   Filed Weights   04/22/20 1243 04/22/20 2044  Weight: 67 kg 66.4 kg    Examination:  General exam: Appears calm and comfortable NAD Respiratory system: CTA, no wheeze rales rhonchi Cardiovascular system: RRR S1-S2 + No murmurs rubs  gallops Gastrointestinal system: Soft nontender nondistended positive bowel sounds Central nervous system: Alert and oriented x3.  Grossly intact Extremities: No edema no cyanosis specially developed Skin: Warm dry Psychiatry:  Mood & affect appropriate in current setting.     Data Reviewed: I have personally reviewed following labs and imaging studies  CBC: Recent Labs  Lab 04/21/20 1503 04/22/20 1325 04/22/20 2205 04/23/20 0751 04/23/20 1554 04/24/20 0303  WBC 7.3 5.1 5.3 3.9*  --  4.2  HGB 4.0* 4.0* 5.7* 7.1* 8.2* 8.4*  HCT 13.8* 14.4* 19.8* 23.0* 26.1* 26.6*  MCV 91 96.0 92.1 89.8  --  86.9  PLT 302 290 245 200  --  941   Basic Metabolic Panel: Recent Labs  Lab 04/21/20 1503 04/22/20 1325 04/23/20 0751 04/24/20 0303  NA 135 135 140 142  K 5.3* 3.6 3.6 3.4*  CL 105 112* 113* 113*  CO2 16* 16* 18* 18*  GLUCOSE 105* 115* 92 93  BUN 15 16 12 10   CREATININE 1.95* 2.28* 1.83* 1.76*  CALCIUM 9.1 8.9 8.7* 8.7*   GFR: Estimated Creatinine Clearance: 31.1 mL/min (A) (by C-G formula based on SCr of 1.76 mg/dL (H)). Liver Function Tests: Recent Labs  Lab 04/21/20 1503 04/22/20 1325  AST 20 26  ALT 36* 35  ALKPHOS 124* 87  BILITOT <0.2 0.6  PROT 6.1 7.0  ALBUMIN 4.0 3.9   No results for input(s): LIPASE, AMYLASE in the last 168 hours. No results for input(s): AMMONIA in the last 168 hours. Coagulation Profile: No results for input(s): INR, PROTIME in the last 168 hours. Cardiac Enzymes: No results for input(s): CKTOTAL, CKMB, CKMBINDEX, TROPONINI in the last 168 hours. BNP (last 3 results) No results for input(s): PROBNP in the last 8760 hours. HbA1C: No results for input(s): HGBA1C in the last 72 hours. CBG: No results for input(s): GLUCAP in the last 168 hours. Lipid Profile: No results for input(s): CHOL, HDL, LDLCALC, TRIG, CHOLHDL, LDLDIRECT in the last 72 hours. Thyroid Function Tests: No results for input(s): TSH, T4TOTAL, FREET4, T3FREE, THYROIDAB  in the last 72 hours. Anemia Panel: Recent Labs    04/22/20 1416  VITAMINB12 261  FOLATE 17.9  FERRITIN 29  TIBC 419  IRON 9*  RETICCTPCT 4.9*   Sepsis Labs: No results for input(s): PROCALCITON, LATICACIDVEN in the last 168 hours.  Recent Results (from the past 240 hour(s))  SARS Coronavirus 2 by RT PCR (hospital order, performed in [facility] hospital lab) Nasopharyngeal Nasopharyngeal Swab     Status: None   Collection Time: 04/22/20  3:22 PM   Specimen: Nasopharyngeal Swab  Result Value Ref Range Status   SARS Coronavirus 2 NEGATIVE NEGATIVE Final    Comment: (NOTE) SARS-CoV-2 target nucleic acids are NOT DETECTED.  The SARS-CoV-2 RNA is generally detectable in upper and lower respiratory specimens during the acute phase of infection. The lowest concentration of SARS-CoV-2 viral copies this assay can detect is 250 copies / mL. A negative result does not preclude SARS-CoV-2 infection and should not be used as the sole basis for treatment or other patient management decisions.  A negative result may occur with improper specimen collection / handling, submission of specimen other than nasopharyngeal swab, presence of viral mutation(s) within the areas targeted by this assay, and inadequate number of viral copies (<250 copies / mL). A negative result must be combined with clinical observations, patient history, and epidemiological information.  Fact Sheet for Patients:   StrictlyIdeas.no  Fact Sheet for Healthcare Providers: BankingDealers.co.za  This test is not yet approved or  cleared by the [place] FDA and has been authorized for detection and/or diagnosis of SARS-CoV-2 by FDA under an Emergency Use Authorization (EUA).  This EUA will remain in effect (meaning this test can be used) for the duration of the COVID-19 declaration under Section 564(b)(1) of the Act, 21 U.S.C. section 360bbb-3(b)(1), unless the  authorization is terminated or revoked sooner.  Performed at [facility], [place] [address], [place], [place] [address]          Radiology Studies: No results found.      Scheduled Meds: . DULoxetine  60 mg Oral Daily  . furosemide  20 mg Intravenous Once  . [START ON 04/26/2020] pantoprazole  40 mg Intravenous Q12H  . rosuvastatin  40 mg Oral QHS  . sodium chloride flush  3 mL Intravenous Q12H   Continuous Infusions: . sodium chloride Stopped (04/22/20 1705)  . lactated ringers 75 mL/hr at 04/24/20 0026  . pantoprozole (PROTONIX) infusion 8 mg/hr (04/24/20 1229)    Assessment & Plan:   Principal Problem:   Symptomatic anemia Active Problems:   Hyperlipidemia with target low density lipoprotein (LDL) cholesterol less than 70 mg/dL   CAD, NATIVE VESSEL   SYSTOLIC HEART FAILURE, CHRONIC   Dual implantable cardioverter-defibrillator in  situ   Acute renal failure superimposed on stage 3b chronic kidney disease (HCC)   Symptomatic anemia -Patient without prior Gi evaluation in her lifetime, presenting with progressive SOB and dizziness with exertion -Hgb 4  -Heme positive, no report of abnormal stools -Normal BUN, likely lower source - concerning for colon malignancy -Hgb 4.0; normal MCV - appears to be more c/w anemia of chronic disease Continue telemetry -Transfused 2 units PRBC Hg 7.1 this am, will give another unit of packed red blood cells today Monitor volume status as patient has history of CHF  status post EGD and colonoscopy today, see report-cscope with polypoid lesion in asc colon concerning for malignancy vs tubulovillous adenoma. bx pending.  GI rec. Cardiac clearance and surgery consult Results discussed with the patient at bedside by me. Hold asa today Continue ivppi Advance diet. H/h stable, s/p total 4 U prbc. Continue to monitor H&H     AKI on stage 3b CKD--Likely due to prerenal failure secondary to dehydration and continuation of  diruetic, and anemia -Baseline creatinine is roughly 1.5.   Creatinine improved after transfusion.  Improved with creatinine 1.76 today -Hold diuretics for now but may need to use it as as needed if giving transfusion  Avoid nephrotoxic agents  Continue to monitor   Chronic systolic CHF with defibrillator in place; h/o CAD -s/p AICD, stent placement -Hold ASA for now -Normalization of EF on most recent  (07/2019) echo euvolemic on exam Continue to monitor volume status Cards consulted for preop risk stratification  HTN -Hold Lopressor given currently low BP Monitor BP closely once improved and can reinstitute  HLD Continue Crestor  Depression Continue Cymbalta   DVT prophylaxis: SCD Code Status: Full Family Communication: None at bedside Disposition Plan: Home Status is: Inpatient  Remains inpatient appropriate because:Ongoing diagnostic testing needed not appropriate for outpatient work up   Dispo: The patient is from: Home              Anticipated d/c is to: Home              Anticipated d/c date is: 2 days              Patient currently is not medically stable to d/c.needs more diagnostic w/u             LOS: 2 days   Time spent: 45 minutes with more than 50% on coc    Nolberto Hanlon, MD [facility] Pager [phone]  If 7PM-7AM, please contact night-coverage [email or web address] Password [email or web address] 04/24/2020, 5:29 PM

## 2020-04-24 NOTE — Transfer of Care (Signed)
Immediate Anesthesia Transfer of Care Note  Patient: [name] [name]  Procedure(s) Performed: ESOPHAGOGASTRODUODENOSCOPY (EGD) WITH PROPOFOL (N/A ) COLONOSCOPY WITH PROPOFOL (N/A ) HOT HEMOSTASIS (ARGON PLASMA COAGULATION/BICAP) (N/A ) POLYPECTOMY BIOPSY SUBMUCOSAL TATTOO INJECTION  Patient Location: Endoscopy Unit  Anesthesia Type:MAC  Level of Consciousness: awake, alert , oriented and patient cooperative  Airway & Oxygen Therapy: Patient Spontanous Breathing and Patient connected to nasal cannula oxygen  Post-op Assessment: Report given to RN and Post -op Vital signs reviewed and stable  Post vital signs: Reviewed and stable  Last Vitals:  Vitals Value Taken Time  BP 104/55 04/24/20 1043  Temp 36.7 C 04/24/20 1040  Pulse 88 04/24/20 1045  Resp 21 04/24/20 1045  SpO2 99 % 04/24/20 1045  Vitals shown include unvalidated device data.  Last Pain:  Vitals:   04/24/20 1040  TempSrc: Oral  PainSc: 0-No pain         Complications: No complications documented.

## 2020-04-24 NOTE — Telephone Encounter (Signed)
Agree probably SVT

## 2020-04-24 NOTE — Interval H&P Note (Signed)
History and Physical Interval Note:  04/24/2020 9:48 AM  [name] [name]  has presented today for surgery, with the diagnosis of Anemia; Heme positive stool.  The various methods of treatment have been discussed with the patient and family. After consideration of risks, benefits and other options for treatment, the patient has consented to  Procedure(s): ESOPHAGOGASTRODUODENOSCOPY (EGD) WITH PROPOFOL (N/A) COLONOSCOPY WITH PROPOFOL (N/A) as a surgical intervention.  The patient's history has been reviewed, patient examined, no change in status, stable for surgery.  I have reviewed the patient's chart and labs.  Questions were answered to the patient's satisfaction.     Lear Ng

## 2020-04-24 NOTE — Op Note (Signed)
[facility] Patient Name: [name] [name] Procedure Date : 04/24/2020 MRN: [id number] Attending MD: Lear Ng , MD Date of Birth: 06-23-1956 CSN: [id number] Age: 64 Admit Type: Inpatient Procedure:                Colonoscopy Indications:              This is the patient's first colonoscopy, Heme                            positive stool, Iron deficiency anemia Providers:                Lear Ng, MD, Glori Bickers, RN, Lazaro Arms, Technician, Tyrone Apple, Technician,                            Gershon Crane CRNA, CRNA Referring MD:             hospital team Medicines:                Propofol per Anesthesia, Monitored Anesthesia Care Complications:            No immediate complications. Estimated Blood Loss:     Estimated blood loss was minimal. Procedure:                Pre-Anesthesia Assessment:                           - Prior to the procedure, a History and Physical                            was performed, and patient medications and                            allergies were reviewed. The patient's tolerance of                            previous anesthesia was also reviewed. The risks                            and benefits of the procedure and the sedation                            options and risks were discussed with the patient.                            All questions were answered, and informed consent                            was obtained. Prior Anticoagulants: The patient has                            taken no previous anticoagulant or antiplatelet  agents. ASA Grade Assessment: III - A patient with                            severe systemic disease. After reviewing the risks                            and benefits, the patient was deemed in                            satisfactory condition to undergo the procedure.                           After obtaining informed consent, the  colonoscope                            was passed under direct vision. Throughout the                            procedure, the patient's blood pressure, pulse, and                            oxygen saturations were monitored continuously. The                            PCF-H190DL ([id number]) Olympus pediatric colonoscope                            was introduced through the anus and advanced to the                            the cecum, identified by appendiceal orifice and                            ileocecal valve. The colonoscopy was performed with                            difficulty due to significant looping and a                            tortuous colon. Successful completion of the                            procedure was aided by straightening and shortening                            the scope to obtain bowel loop reduction and                            applying abdominal pressure. The patient tolerated                            the procedure well. The quality of the bowel  preparation was fair. The ileocecal valve,                            appendiceal orifice, and rectum were photographed. Scope In: 10:16:12 AM Scope Out: 10:35:01 AM Scope Withdrawal Time: 0 hours 9 minutes 17 seconds  Total Procedure Duration: 0 hours 18 minutes 49 seconds  Findings:      The perianal and digital rectal examinations were normal.      A large polypoid lesion was found in the ascending colon. The lesion was       polypoid and multi-lobulated. No bleeding was present. Biopsies were       taken with a cold forceps for histology. Estimated blood loss was       minimal. Area was tattooed with an injection of 2 mL of Spot (carbon       black).      A 6 mm polyp was found in the descending colon. The polyp was sessile.       The polyp was removed with a hot snare. Resection and retrieval were       complete. Estimated blood loss: none.      Multiple small and  large-mouthed diverticula were found in the sigmoid       colon.      Internal hemorrhoids were found during retroflexion. The hemorrhoids       were medium-sized and Grade I (internal hemorrhoids that do not       prolapse).      A 4 mm polyp was found in the cecum. The polyp was sessile. Polypectomy       was not attempted due to previously identified malignancy. Impression:               - Preparation of the colon was fair.                           - Rule out malignancy, polypoid lesion in the                            ascending colon. Biopsied. Tattooed.                           - One 6 mm polyp in the descending colon, removed                            with a hot snare. Resected and retrieved.                           - Diverticulosis in the sigmoid colon.                           - Internal hemorrhoids.                           - One 4 mm polyp in the cecum. Resection not                            attempted. Recommendation:           - Await pathology results.                           -  Refer to a Psychologist, sport and exercise.                           - Clear liquid diet.                           - Repeat colonoscopy for surveillance based on                            pathology results. Procedure Code(s):        --- Professional ---                           [phone], Colonoscopy, flexible; with removal of                            tumor(s), polyp(s), or other lesion(s) by snare                            technique                           45381, Colonoscopy, flexible; with directed                            submucosal injection(s), any substance                           31497, 52, Colonoscopy, flexible; with biopsy,                            single or multiple Diagnosis Code(s):        --- Professional ---                           D50.9, Iron deficiency anemia, unspecified                           R19.5, Other fecal abnormalities                           D49.0, Neoplasm of  unspecified behavior of                            digestive system                           K63.5, Polyp of colon                           K64.0, First degree hemorrhoids                           K57.30, Diverticulosis of large intestine without                            perforation or abscess without bleeding CPT copyright 2019 American Medical Association. All rights reserved. The codes documented in this report  are preliminary and upon coder review may  be revised to meet current compliance requirements. Lear Ng, MD 04/24/2020 10:55:04 AM This report has been signed electronically. Number of Addenda: 0

## 2020-04-24 NOTE — Anesthesia Preprocedure Evaluation (Signed)
Anesthesia Evaluation  Patient identified by MRN, date of birth, ID band Patient awake    Reviewed: Allergy & Precautions, NPO status , Patient's Chart, lab work & pertinent test results  History of Anesthesia Complications Negative for: history of anesthetic complications  Airway Mallampati: II  TM Distance: >3 FB Neck ROM: Full    Dental  (+) Lower Dentures, Upper Dentures   Pulmonary neg pulmonary ROS, former smoker,    Pulmonary exam normal        Cardiovascular + CAD, + Cardiac Stents and +CHF  + Cardiac Defibrillator  Rhythm:Regular Rate:Tachycardia     Neuro/Psych negative neurological ROS  negative psych ROS   GI/Hepatic Neg liver ROS, GERD  ,  Endo/Other  negative endocrine ROS  Renal/GU Renal Insufficiency and ARFRenal disease  negative genitourinary   Musculoskeletal negative musculoskeletal ROS (+)   Abdominal   Peds  Hematology  (+) anemia ,   Anesthesia Other Findings  Echo 07/31/19: EF 60-65%, normal RV function, valves unremarkable  Reproductive/Obstetrics                            Anesthesia Physical Anesthesia Plan  ASA: III  Anesthesia Plan: MAC   Post-op Pain Management:    Induction: Intravenous  PONV Risk Score and Plan: 2 and Propofol infusion, TIVA and Treatment may vary due to age or medical condition  Airway Management Planned: Natural Airway, Nasal Cannula and Simple Face Mask  Additional Equipment: None  Intra-op Plan:   Post-operative Plan:   Informed Consent: I have reviewed the patients History and Physical, chart, labs and discussed the procedure including the risks, benefits and alternatives for the proposed anesthesia with the patient or authorized representative who has indicated his/her understanding and acceptance.       Plan Discussed with:   Anesthesia Plan Comments:        Anesthesia Quick Evaluation

## 2020-04-24 NOTE — Anesthesia Postprocedure Evaluation (Signed)
Anesthesia Post Note  Patient: [name] [name]  Procedure(s) Performed: ESOPHAGOGASTRODUODENOSCOPY (EGD) WITH PROPOFOL (N/A ) COLONOSCOPY WITH PROPOFOL (N/A ) HOT HEMOSTASIS (ARGON PLASMA COAGULATION/BICAP) (N/A ) POLYPECTOMY BIOPSY SUBMUCOSAL TATTOO INJECTION     Patient location during evaluation: Endoscopy Anesthesia Type: MAC Level of consciousness: awake and alert Pain management: pain level controlled Vital Signs Assessment: post-procedure vital signs reviewed and stable Respiratory status: spontaneous breathing, nonlabored ventilation and respiratory function stable Cardiovascular status: blood pressure returned to baseline and stable Postop Assessment: no apparent nausea or vomiting Anesthetic complications: no   No complications documented.  Last Vitals:  Vitals:   04/24/20 1110 04/24/20 1143  BP: (!) 121/54 (!) 121/58  Pulse: 85   Resp: 20 18  Temp:  (!) 36.4 C  SpO2: 97% 98%    Last Pain:  Vitals:   04/24/20 1143  TempSrc: Oral  PainSc:                  Lidia Collum

## 2020-04-24 NOTE — Brief Op Note (Signed)
Gastric AVMs fulgurated on EGD. Colonoscopy significant for polypoid lesion in ascending colon (non-obstructing) concerning for malignancy vs tubulovillous adenoma. Biopsies pending and tattooed. See endopro note for details. Needs close f/u with surgery for resection and likely will need cardiology clearance so would recommend that as an inpt unless it can be done in the near future as an outpt. Defer timing of CCS and cards consults to hospitalists. Will sign off. Call is questions.

## 2020-04-25 DIAGNOSIS — I2510 Atherosclerotic heart disease of native coronary artery without angina pectoris: Secondary | ICD-10-CM

## 2020-04-25 DIAGNOSIS — Z01818 Encounter for other preprocedural examination: Secondary | ICD-10-CM

## 2020-04-25 DIAGNOSIS — I5022 Chronic systolic (congestive) heart failure: Secondary | ICD-10-CM

## 2020-04-25 DIAGNOSIS — E785 Hyperlipidemia, unspecified: Secondary | ICD-10-CM

## 2020-04-25 LAB — CBC
HCT: 26.2 % — ABNORMAL LOW (ref 36.0–46.0)
Hemoglobin: 8.4 g/dL — ABNORMAL LOW (ref 12.0–15.0)
MCH: 28.4 pg (ref 26.0–34.0)
MCHC: 32.1 g/dL (ref 30.0–36.0)
MCV: 88.5 fL (ref 80.0–100.0)
Platelets: 179 10*3/uL (ref 150–400)
RBC: 2.96 MIL/uL — ABNORMAL LOW (ref 3.87–5.11)
RDW: 15.7 % — ABNORMAL HIGH (ref 11.5–15.5)
WBC: 5.1 10*3/uL (ref 4.0–10.5)
nRBC: 0.4 % — ABNORMAL HIGH (ref 0.0–0.2)

## 2020-04-25 LAB — BASIC METABOLIC PANEL
Anion gap: 13 (ref 5–15)
BUN: 9 mg/dL (ref 8–23)
CO2: 19 mmol/L — ABNORMAL LOW (ref 22–32)
Calcium: 8.4 mg/dL — ABNORMAL LOW (ref 8.9–10.3)
Chloride: 109 mmol/L (ref 98–111)
Creatinine, Ser: 1.95 mg/dL — ABNORMAL HIGH (ref 0.44–1.00)
GFR calc Af Amer: 31 mL/min — ABNORMAL LOW (ref >60)
GFR calc non Af Amer: 27 mL/min — ABNORMAL LOW (ref >60)
Glucose, Bld: 100 mg/dL — ABNORMAL HIGH (ref 70–99)
Potassium: 3.3 mmol/L — ABNORMAL LOW (ref 3.5–5.1)
Sodium: 141 mmol/L (ref 135–145)

## 2020-04-25 MED ORDER — PANTOPRAZOLE SODIUM 40 MG PO TBEC
40.0000 mg | DELAYED_RELEASE_TABLET | Freq: Two times a day (BID) | ORAL | 0 refills | Status: DC
Start: 2020-04-25 — End: 2020-07-10

## 2020-04-25 MED ORDER — POTASSIUM CHLORIDE CRYS ER 20 MEQ PO TBCR
40.0000 meq | EXTENDED_RELEASE_TABLET | Freq: Once | ORAL | Status: AC
  Administered 2020-04-25: 40 meq via ORAL
  Filled 2020-04-25: qty 2

## 2020-04-25 MED ORDER — METOPROLOL TARTRATE 25 MG PO TABS
25.0000 mg | ORAL_TABLET | Freq: Two times a day (BID) | ORAL | Status: DC
  Administered 2020-04-25: 25 mg via ORAL
  Filled 2020-04-25: qty 1

## 2020-04-25 NOTE — Consult Note (Signed)
Cardiology Consultation:   Patient ID: [name] [name] MRN: [id number]; DOB: 02/27/1956  Admit date: 04/22/2020 Date of Consult: 04/25/2020  Primary Care Provider: Redmond School, MD [facility] Cardiologist: Sherren Mocha, MD  [place] Electrophysiologist:  None    Patient Profile:   [name] [name] is a 64 y.o. female with a history of CAD with anterior MI in 2010 s/p PCI to LAD, ischemic cardiomyopathy with EF of 35-40% in 2014 but improved to 60-65% on Echo in 07/2019 s/p ICD for primary prevention, hyperlipidemia, GERD, and CKD stage III who is being seen today for pre-op evaluation at the request of Dr. Kurtis Bushman.  History of Present Illness:   Ms. [name] [name] is a 64 year old female with the above history who is followed by Dr. Burt Knack and Dr. Caryl Comes. Patient has history of CAD with anterior MI in 2010. Cardiac cath at that time showed 100% stenosis of ostial LAD which was treated with BMS. In 06/2009, ICD was implanted for primary prevention for ischemic cardiomyopathy due to EF <30%. Patient has not had an ischemic evaluation since time of MI. Most recent Echo in 07/2019 read LVEF of 60-65% but it was noted to be difficult study. Per Dr. Antionette Char interpretation, probably moderate LV dysfunction with akinesis of anteroseptum (stable from previous Echo studies). Patient recently seen by Richardson Dopp, PA-C, on 04/21/2020 for evaluation of symptomatic hypotension. At that visit, patient reported dizziness/lightheadedness as well as dyspnea with some activities for the last 2 weeks. Systolic BP had been in the 70's to 80's (usually in the 90's). Prior to this visit, we had stopped her Losartan, reduced her Lasix, and switched her Coreg to Lopressor. She noted some improvement in dizziness with these changes. BP stable at 120/60 in the office. Routine labs were checked and she was noted to have a hemoglobin of 5.7. She was advised to go to the ED for further evaluation.  In the ED, hemoccult positive. She  was admitted for GI bleed with symptomatic anemia. She has received a total of 4 units of PRBCs so far. GI was consulted and patient underwent EGD/colonscopy on 04/24/2020. EGD showedfew non-bleeding angiodysplastic lesion treated with APC  and colonscopy showed polypoid lesion in the ascending colon concerning for malignancy vs tubulovillous adenoma. Biopsies pending. Close follow-up with Surgery was recommended for resection. Cardiology consulted for pre-op evaluation.   At the time of this evaluation, patient resting comfortably in bed in no acute distress. She reports feeling much better after blood transfusions. Dizziness, lightheadedness, and shortness of breath have resolved. She denies any chest pain, orthopnea, PND. She does have some mild edema of bilateral lower and upper extremities since she stopped taking her Lasix about 1 week ago but states Lasix normally keeps edema well controlled. No feelings of palpitations. No syncope. She denies any recent fevers or illnesses. She does report an unintentional 10 lb weight loss over the last 6 months. She also reports some night sweats but states she has always had these. No obvious hematochezia, melena, or hematuria.   She has good functional status and easily able to complete >4.0 METS. Prior to 1 week, ago she would take her dog on a walk every day around apartment complex. She lives on the second floor so she routinely goes up 1 flight of steps without any anginal symptoms.    Past Medical History:  Diagnosis Date  . Cervical disc disease    disabled Engineer, structural  . Depression   . Dyslipidemia   .  GERD (gastroesophageal reflux disease)   . ICD (implantable cardiac defibrillator), single MDT    treated with BMS to LAD  . Ischemic cardiomyopathy    post-MI LVEF less than 30%; acute AMI Rx w BMS>>LAD  . Uterine cancer [facility])    age 43, s/p partial hysterectomy    Past Surgical History:  Procedure Laterality Date  . BIOPSY  04/24/2020    Procedure: BIOPSY;  Surgeon: Wilford Corner, MD;  Location: [place];  Service: Endoscopy;;  . CERVICAL [place]    . COLONOSCOPY WITH PROPOFOL N/A 04/24/2020   Procedure: COLONOSCOPY WITH PROPOFOL;  Surgeon: Wilford Corner, MD;  Location: [place];  Service: Endoscopy;  Laterality: N/A;  . CORONARY ANGIOPLASTY WITH STENT PLACEMENT    . ESOPHAGOGASTRODUODENOSCOPY (EGD) WITH PROPOFOL N/A 04/24/2020   Procedure: ESOPHAGOGASTRODUODENOSCOPY (EGD) WITH PROPOFOL;  Surgeon: Wilford Corner, MD;  Location: [place];  Service: Endoscopy;  Laterality: N/A;  . HOT HEMOSTASIS N/A 04/24/2020   Procedure: HOT HEMOSTASIS (ARGON PLASMA COAGULATION/BICAP);  Surgeon: Wilford Corner, MD;  Location: [place];  Service: Endoscopy;  Laterality: N/A;  . ICD GENERATOR CHANGEOUT N/A 11/17/2017   Procedure: ICD GENERATOR CHANGEOUT;  Surgeon: Deboraha Sprang, MD;  Location: [place] CV [facility];  Service: Cardiovascular;  Laterality: N/A;  . icd implanted    . PARTIAL HYSTERECTOMY    . partial right masectomy    . POLYPECTOMY  04/24/2020   Procedure: POLYPECTOMY;  Surgeon: Wilford Corner, MD;  Location: [facility] ENDOSCOPY;  Service: Endoscopy;;  . SUBMUCOSAL TATTOO INJECTION  04/24/2020   Procedure: SUBMUCOSAL TATTOO INJECTION;  Surgeon: Wilford Corner, MD;  Location: [place];  Service: Endoscopy;;     Home Medications:  Prior to Admission medications   Medication Sig Start Date End Date Taking? Authorizing Provider  acetaminophen (TYLENOL) 325 MG tablet Take 650 mg by mouth every 6 (six) hours as needed for mild pain or moderate pain.    Yes [provider]  aspirin EC 81 MG EC tablet Take 1 tablet (81 mg total) by mouth daily. 05/02/11  Yes Sherren Mocha, MD  DULoxetine (CYMBALTA) 60 MG capsule Take 60 mg by mouth daily.  11/25/14  Yes [provider]  furosemide (LASIX) 20 MG tablet TAKE 1 TABLET BY MOUTH EVERY OTHER DAY Patient taking differently: Take 20 mg by mouth daily.   12/11/19  Yes Sherren Mocha, MD  metoprolol tartrate (LOPRESSOR) 50 MG tablet Take 0.5 tablets (25 mg total) by mouth 2 (two) times daily. 04/14/20  Yes Supple, Megan E, RPH-CPP  nitroGLYCERIN (NITROSTAT) 0.4 MG SL tablet Place 0.4 mg under the tongue every 5 (five) minutes x 3 doses as needed for chest pain.    Yes [provider]  pantoprazole (PROTONIX) 40 MG tablet Take 1 tablet (40 mg total) by mouth daily. 07/24/19  Yes Sherren Mocha, MD  potassium chloride SA (KLOR-CON) 20 MEQ tablet TAKE 2 TABLETS(40 MEQ) BY MOUTH DAILY Patient taking differently: Take 40 mEq by mouth once.  09/16/19  Yes Sherren Mocha, MD  rosuvastatin (CRESTOR) 40 MG tablet Take 1 tablet (40 mg total) by mouth daily. Patient taking differently: Take 40 mg by mouth at bedtime.  08/12/19  Yes Sherren Mocha, MD    Inpatient Medications: Scheduled Meds: . DULoxetine  60 mg Oral Daily  . furosemide  20 mg Intravenous Once  . [START ON 04/26/2020] pantoprazole  40 mg Intravenous Q12H  . rosuvastatin  40 mg Oral QHS  . sodium chloride flush  3 mL Intravenous Q12H  Continuous Infusions: . sodium chloride Stopped (04/22/20 1705)  . lactated ringers 75 mL/hr at 04/25/20 0700  . pantoprozole (PROTONIX) infusion 8 mg/hr (04/25/20 0700)   PRN Meds: acetaminophen **OR** acetaminophen, ondansetron **OR** ondansetron (ZOFRAN) IV  Allergies:   No Known Allergies  Social History:   Social History   Socioeconomic History  . Marital status: Divorced    Spouse name: Not on file  . Number of children: Not on file  . Years of education: Not on file  . Highest education level: Not on file  Occupational History  . Not on file  Tobacco Use  . Smoking status: Former Smoker    Packs/day: 0.50    Years: 20.00    Pack years: 10.00    Quit date: 2019    Years since quitting: 2.6  . Smokeless tobacco: Never Used  Vaping Use  . Vaping Use: Never used  Substance and Sexual Activity  . Alcohol use: Yes     Comment: rare  . Drug use: No  . Sexual activity: Not on file  Other Topics Concern  . Not on file  Social History Narrative  . Not on file   Social Determinants of Health   Financial Resource Strain:   . Difficulty of Paying Living Expenses:   Food Insecurity:   . Worried About Charity fundraiser in the Last Year:   . Arboriculturist in the Last Year:   Transportation Needs:   . Film/video editor (Medical):   Marland Kitchen Lack of Transportation (Non-Medical):   Physical Activity:   . Days of Exercise per Week:   . Minutes of Exercise per Session:   Stress:   . Feeling of Stress :   Social Connections:   . Frequency of Communication with Friends and Family:   . Frequency of Social Gatherings with Friends and Family:   . Attends Religious Services:   . Active Member of Clubs or Organizations:   . Attends Archivist Meetings:   Marland Kitchen Marital Status:   Intimate Partner Violence:   . Fear of Current or Ex-Partner:   . Emotionally Abused:   Marland Kitchen Physically Abused:   . Sexually Abused:     Family History:    Family History  Problem Relation Age of Onset  . Heart attack Mother   . Cancer Neg Hx      ROS:  Please see the history of present illness.  All other ROS reviewed and negative.     Physical Exam/Data:   Vitals:   04/24/20 1143 04/24/20 1653 04/24/20 2117 04/25/20 0651  BP: (!) 121/58 125/69 123/65 138/72  Pulse:  88 (!) 102 92  Resp: 18 18 18    Temp: (!) 97.5 F (36.4 C) 98.1 F (36.7 C) 99.9 F (37.7 C) 99.5 F (37.5 C)  TempSrc: Oral Oral Oral   SpO2: 98% 100% 98% 96%  Weight:      Height:        Intake/Output Summary (Last 24 hours) at 04/25/2020 0733 Last data filed at 04/25/2020 0700 Gross per 24 hour  Intake 2973.9 ml  Output 15 ml  Net 2958.9 ml   Last 3 Weights 04/22/2020 04/22/2020 04/21/2020  Weight (lbs) 146 lb 6.2 oz 147 lb 11.3 oz 147 lb 12.8 oz  Weight (kg) 66.4 kg 67 kg 67.042 kg     Body mass index is 25.13 kg/m.  General: 64 y.o.  female resting comfortably in no acute distress. HEENT: Normocephalic and atraumatic. Sclera clear.  Neck: Supple. Soft bilateral bruits - may be radiation from murmur. No JVD. Heart: Tachycardic with regular rhythm. Distinct S1 and S2. Very soft murmur noted. Radial and distal pedal pulses 2+ and equal bilaterally. Lungs: No increased work of breathing. Clear to ausculation bilaterally. No wheezes, rhonchi, or rales.  Abdomen: Soft, non-distended, and non-tender to palpation. Bowel sounds present. Extremities: Mild bilateral upper and lower extremity edema.    Skin: Warm and dry. Neuro: Alert and oriented x3. No focal deficits. Psych: Normal affect. Responds appropriately.   EKG:  The EKG was personally reviewed and demonstrates: Normal sinus rhythm, rate 78 bpm, with non-specific ST/T changes. Normal axis. Normal PR and QRS intervals. QTc 410 ms.  Telemetry:  Telemetry was personally reviewed and demonstrates:  Sinus rhythm with rates ranging from 100's to 140's (currently in the 110's).  Relevant CV Studies:  Echocardiogram 07/31/2019: Impressions:  1. Left ventricular ejection fraction, by visual estimation, is 60 to  65%. The left ventricle has normal function. There is no left ventricular  hypertrophy.  2. LV endocardium is not well visualized . Technically difficult study.  3. Global right ventricle has normal systolic function.The right  ventricular size is normal. No increase in right ventricular wall  thickness.  4. Left atrial size was normal.  5. Right atrial size was normal.  6. The mitral valve is normal in structure. No evidence of mitral valve  regurgitation.  7. The tricuspid valve is normal in structure. Tricuspid valve  regurgitation is trivial.  8. The aortic valve is normal in structure. Aortic valve regurgitation is  not visualized.  9. The pulmonic valve was normal in structure. Pulmonic valve  regurgitation is not visualized.  10. Normal  pulmonary artery systolic pressure.  11. The atrial septum is grossly normal.   Laboratory Data:  High Sensitivity Troponin:  No results for input(s): TROPONINIHS in the last 720 hours.   Chemistry Recent Labs  Lab 04/23/20 0751 04/24/20 0303 04/25/20 0243  NA 140 142 141  K 3.6 3.4* 3.3*  CL 113* 113* 109  CO2 18* 18* 19*  GLUCOSE 92 93 100*  BUN 12 10 9   CREATININE 1.83* 1.76* 1.95*  CALCIUM 8.7* 8.7* 8.4*  GFRNONAA 29* 30* 27*  GFRAA 34* 35* 31*  ANIONGAP 9 11 13     Recent Labs  Lab 04/21/20 1503 04/22/20 1325  PROT 6.1 7.0  ALBUMIN 4.0 3.9  AST 20 26  ALT 36* 35  ALKPHOS 124* 87  BILITOT <0.2 0.6   Hematology Recent Labs  Lab 04/23/20 0751 04/23/20 0751 04/23/20 1554 04/24/20 0303 04/25/20 0243  WBC 3.9*  --   --  4.2 5.1  RBC 2.56*  --   --  3.06* 2.96*  HGB 7.1*   < > 8.2* 8.4* 8.4*  HCT 23.0*   < > 26.1* 26.6* 26.2*  MCV 89.8  --   --  86.9 88.5  MCH 27.7  --   --  27.5 28.4  MCHC 30.9  --   --  31.6 32.1  RDW 15.6*  --   --  15.9* 15.7*  PLT 200  --   --  181 179   < > = values in this interval not displayed.   BNPNo results for input(s): BNP, PROBNP in the last 168 hours.  DDimer No results for input(s): DDIMER in the last 168 hours.   Radiology/Studies:  No results found.   Assessment and Plan:   Pre-Op Evaluation - Patient admitted with symptomatolytic anemia  with active GI bleed. S/p 4 units of PRBCs. EGD showed  EGD showed a few non-bleeding angiodysplastic lesion treated with APC and colonscopy showed polypoid lesion in the ascending colon concerning for malignancy vs tubulovillous adenoma. Biopsies pending. Will need resection at some point in the near future. Cardiology consulted for pre-op evaluation.  - Patient stable from cardiac standpoint. Mild shortness of breath improved after blood transfusion. No angina or acute CHF symptoms. - Per Revised Cardiac Risk Index, considered moderate risk with 6.6% chance of adverse cardiac  outcome. She is able to complete >4.0 METS without any anginal symptoms. Therefore, she would be at acceptable risk for surgery. Will discuss with MD whether she would benefit from repeat Echo though prior to surgery.  CAD - S/p PCI to LAD in 2010. - No angina. - Aspirin on hold due to GI bleed/symptomatic anemia. Continue statin. Restart beta-blocker.  Ischemic Cardiomyopathy - Last Echo in 07/2019 read as LVEF of 60-65% but it was noted to be difficult study. Per Dr. Antionette Char interpretation, probably moderate LV dysfunction with akinesis of anteroseptum (stable from previous Echo studies). - She does have mild edema of lower and upper extremities (she has been off home Lasix x1 week given low BP). Lungs clear. - BP now stable so consider restarting home Lasix 20mg  every other day. - Recommend restarting Lopressor 25 mg twice daily as well given. - Will discuss whether repeat Echo is needed with MD.  S/p ICD - Per phone note on 04/23/2020, looks like patient had episode of VT vs SVT on 03/14/2020 that terminated with ATP x3. Dr. Caryl Comes reviewed and felt probably SVT.  Sinus Tachycardia - Rates ranging from the 100's to 140's on telemetry. Currently in the 110's. Patient asymptomatic with this.  - Suspect physiologic response in setting of acute anemia.  - Consider restarting Lopressor 25mg  twice daily.  Hyperlipidemia - Continue home statin.  Acute GI Bleed with Symptomatic Anemia - s/p 4 units of RBCS.  - Hemoglobin stable at 8.4 today. - GI following. EGD and Colonoscopy as above. - Management per primary team and GI.  Hypokalemia - Potassium 3.3 today. Will replete. Will need to monitor closely given mild hyperkalemia (5.3) on admission. - Continue daily BMET.  For questions or updates, please contact [place] Please consult [email or web address] for contact info under    Signed, Darreld Mclean, PA-C  04/25/2020 7:33 AM

## 2020-04-25 NOTE — Consult Note (Signed)
[facility] Surgery Consult Note  [name] [name] December 15, 1955  [id number].    Requesting MD: Nolberto Hanlon Chief Complaint/Reason for Consult: ascending colon lesion  HPI:  [name] [name] is a 64yo female with significant cardiac history including hx of CAD with anterior MI 2010 s/p PCI of LAD, ischemic cardiomyopathy with EF 35-40% in 2014 s/p ICD (ECHO 07/2019 with EF 60-65%), HLD, depression, and CKD stage 3 who was admitted to [facility] 8/4 complaining of 3 weeks of weakness. She denies any abdominal pain, nausea, vomiting, hematochezia, melena, hematemesis. She does report an unintentional 10lb weight loss over the last 2 weeks, as well as some SOB with exertion. Denies dizziness or lightheadedness. States that she saw her cardiologist on 8/3 who did blood work that showed hemoglobin critically low. She was advised to go to the ED.  Patient was admitted to the medical service. She received 2 units PRBCs 8/4 and 1 unit PRBCs 8/5. Hemoglobin stable at 8.4 today. Stools noted to be heme positive. Gastroenterology was consulted and performed EGD and colonoscopy yesterday. EGD showed a few non-bleeding angiodysplastic lesions in the stomach that were treated with argon plasma coagulation (APC). Colonoscopy revealed polypoid lesion in the ascending colon (this was biopsied and tattooed), as well as one 6 mm polyp in the descending colon that was resected; diverticulosis in the sigmoid colon; internal hemorrhoids. General surgery asked to see.  Abdominal surgical history: partial hysterectomy for uterine cancer No FH colon cancer Anticoagulants: none Former smoker, quit 2 years ago Retired  Lives at home by herself  Review of Systems  Constitutional: Positive for malaise/fatigue and weight loss. Negative for chills and fever.  Respiratory: Positive for shortness of breath.   Cardiovascular: Negative for chest pain.  Gastrointestinal: Negative.   Genitourinary: Negative.   Musculoskeletal: Negative.      All systems reviewed and otherwise negative except for as above  Family History  Problem Relation Age of Onset  . Heart attack Mother   . Cancer Neg Hx     Past Medical History:  Diagnosis Date  . Cervical disc disease    disabled Engineer, structural  . Depression   . Dyslipidemia   . GERD (gastroesophageal reflux disease)   . ICD (implantable cardiac defibrillator), single MDT    treated with BMS to LAD  . Ischemic cardiomyopathy    post-MI LVEF less than 30%; acute AMI Rx w BMS>>LAD  . Uterine cancer [facility])    age 64, s/p partial hysterectomy    Past Surgical History:  Procedure Laterality Date  . BIOPSY  04/24/2020   Procedure: BIOPSY;  Surgeon: Wilford Corner, MD;  Location: [place];  Service: Endoscopy;;  . CERVICAL [place]    . COLONOSCOPY WITH PROPOFOL N/A 04/24/2020   Procedure: COLONOSCOPY WITH PROPOFOL;  Surgeon: Wilford Corner, MD;  Location: [place];  Service: Endoscopy;  Laterality: N/A;  . CORONARY ANGIOPLASTY WITH STENT PLACEMENT    . ESOPHAGOGASTRODUODENOSCOPY (EGD) WITH PROPOFOL N/A 04/24/2020   Procedure: ESOPHAGOGASTRODUODENOSCOPY (EGD) WITH PROPOFOL;  Surgeon: Wilford Corner, MD;  Location: [place];  Service: Endoscopy;  Laterality: N/A;  . HOT HEMOSTASIS N/A 04/24/2020   Procedure: HOT HEMOSTASIS (ARGON PLASMA COAGULATION/BICAP);  Surgeon: Wilford Corner, MD;  Location: [place];  Service: Endoscopy;  Laterality: N/A;  . ICD GENERATOR CHANGEOUT N/A 11/17/2017   Procedure: ICD GENERATOR CHANGEOUT;  Surgeon: Deboraha Sprang, MD;  Location: [place] [facility];  Service: Cardiovascular;  Laterality: N/A;  . icd implanted    . PARTIAL  HYSTERECTOMY    . partial right masectomy    . POLYPECTOMY  04/24/2020   Procedure: POLYPECTOMY;  Surgeon: Wilford Corner, MD;  Location[facility] ENDOSCOPY;  Service: Endoscopy;;  . SUBMUCOSAL TATTOO INJECTION  04/24/2020   Procedure: SUBMUCOSAL TATTOO INJECTION;  Surgeon: Wilford Corner, MD;  Location:  [facility];  Service: Endoscopy;;    Social History:  reports that she quit smoking about 2 years ago. She has a 10.00 pack-year smoking history. She has never used smokeless tobacco. She reports current alcohol use. She reports that she does not use drugs.  Allergies: No Known Allergies  Medications Prior to Admission  Medication Sig Dispense Refill  . acetaminophen (TYLENOL) 325 MG tablet Take 650 mg by mouth every 6 (six) hours as needed for mild pain or moderate pain.     Marland Kitchen aspirin EC 81 MG EC tablet Take 1 tablet (81 mg total) by mouth daily. 30 tablet 6  . DULoxetine (CYMBALTA) 60 MG capsule Take 60 mg by mouth daily.   3  . furosemide (LASIX) 20 MG tablet TAKE 1 TABLET BY MOUTH EVERY OTHER DAY (Patient taking differently: Take 20 mg by mouth daily. ) 45 tablet 2  . metoprolol tartrate (LOPRESSOR) 50 MG tablet Take 0.5 tablets (25 mg total) by mouth 2 (two) times daily. 90 tablet 3  . nitroGLYCERIN (NITROSTAT) 0.4 MG SL tablet Place 0.4 mg under the tongue every 5 (five) minutes x 3 doses as needed for chest pain.     . pantoprazole (PROTONIX) 40 MG tablet Take 1 tablet (40 mg total) by mouth daily. 90 tablet 3  . potassium chloride SA (KLOR-CON) 20 MEQ tablet TAKE 2 TABLETS(40 MEQ) BY MOUTH DAILY (Patient taking differently: Take 40 mEq by mouth once. ) 180 tablet 3  . rosuvastatin (CRESTOR) 40 MG tablet Take 1 tablet (40 mg total) by mouth daily. (Patient taking differently: Take 40 mg by mouth at bedtime. ) 30 tablet 11    Prior to Admission medications   Medication Sig Start Date End Date Taking? Authorizing Provider  acetaminophen (TYLENOL) 325 MG tablet Take 650 mg by mouth every 6 (six) hours as needed for mild pain or moderate pain.    Yes [provider]  aspirin EC 81 MG EC tablet Take 1 tablet (81 mg total) by mouth daily. 05/02/11  Yes Sherren Mocha, MD  DULoxetine (CYMBALTA) 60 MG capsule Take 60 mg by mouth daily.  11/25/14  Yes [provider]   furosemide (LASIX) 20 MG tablet TAKE 1 TABLET BY MOUTH EVERY OTHER DAY Patient taking differently: Take 20 mg by mouth daily.  12/11/19  Yes Sherren Mocha, MD  metoprolol tartrate (LOPRESSOR) 50 MG tablet Take 0.5 tablets (25 mg total) by mouth 2 (two) times daily. 04/14/20  Yes Supple, Megan E, RPH-CPP  nitroGLYCERIN (NITROSTAT) 0.4 MG SL tablet Place 0.4 mg under the tongue every 5 (five) minutes x 3 doses as needed for chest pain.    Yes [provider]  pantoprazole (PROTONIX) 40 MG tablet Take 1 tablet (40 mg total) by mouth daily. 07/24/19  Yes Sherren Mocha, MD  potassium chloride SA (KLOR-CON) 20 MEQ tablet TAKE 2 TABLETS(40 MEQ) BY MOUTH DAILY Patient taking differently: Take 40 mEq by mouth once.  09/16/19  Yes Sherren Mocha, MD  rosuvastatin (CRESTOR) 40 MG tablet Take 1 tablet (40 mg total) by mouth daily. Patient taking differently: Take 40 mg by mouth at bedtime.  08/12/19  Yes Sherren Mocha, MD    Blood  pressure 138/72, pulse 92, temperature 99.5 F (37.5 C), resp. rate 18, height 5\' 4"  (1.626 m), weight 66.4 kg, SpO2 96 %. Physical Exam: General: pleasant, WD/WN white female who is laying in bed in NAD HEENT: head is normocephalic, atraumatic.  Sclera are noninjected.  PERRL.  Ears and nose without any masses or lesions.  Mouth is pink and moist. Dentition fair Heart: regular, rate, and rhythm.  Normal s1,s2. No obvious murmurs, gallops, or rubs noted.  Palpable pedal pulses bilaterally  Lungs: CTAB, no wheezes, rhonchi, or rales noted.  Respiratory effort nonlabored Abd: soft, NT/ND, +BS, no masses, hernias, or organomegaly MS: no BUE/BLE edema, calves soft and nontender Skin: warm and dry with no masses, lesions, or rashes Psych: A&Ox4 with an appropriate affect Neuro: cranial nerves grossly intact, equal strength in BUE/BLE bilaterally, normal speech, thought process intact  Results for orders placed or performed during the hospital encounter of 04/22/20  (from the past 48 hour(s))  Hemoglobin and hematocrit, blood     Status: Abnormal   Collection Time: 04/23/20  3:54 PM  Result Value Ref Range   Hemoglobin 8.2 (L) 12.0 - 15.0 g/dL   HCT 26.1 (L) 36 - 46 %    Comment: Performed at [facility], [address], [place], [place] [address]  Basic metabolic panel     Status: Abnormal   Collection Time: 04/24/20  3:03 AM  Result Value Ref Range   Sodium 142 135 - 145 mmol/L   Potassium 3.4 (L) 3.5 - 5.1 mmol/L   Chloride 113 (H) 98 - 111 mmol/L   CO2 18 (L) 22 - 32 mmol/L   Glucose, Bld 93 70 - 99 mg/dL    Comment: Glucose reference range applies only to samples taken after fasting for at least 8 hours.   BUN 10 8 - 23 mg/dL   Creatinine, Ser 1.76 (H) 0.44 - 1.00 mg/dL   Calcium 8.7 (L) 8.9 - 10.3 mg/dL   GFR calc non Af Amer 30 (L) >60 mL/min   GFR calc Af Amer 35 (L) >60 mL/min   Anion gap 11 5 - 15    Comment: Performed at [facility] [address], [place], [place] [address]  CBC     Status: Abnormal   Collection Time: 04/24/20  3:03 AM  Result Value Ref Range   WBC 4.2 4.0 - 10.5 K/uL   RBC 3.06 (L) 3.87 - 5.11 MIL/uL   Hemoglobin 8.4 (L) 12.0 - 15.0 g/dL   HCT 26.6 (L) 36 - 46 %   MCV 86.9 80.0 - 100.0 fL   MCH 27.5 26.0 - 34.0 pg   MCHC 31.6 30.0 - 36.0 g/dL   RDW 15.9 (H) 11.5 - 15.5 %   Platelets 181 150 - 400 K/uL   nRBC 0.7 (H) 0.0 - 0.2 %    Comment: Performed at [facility], [place] [address] [place], [place] [address]  CBC     Status: Abnormal   Collection Time: 04/25/20  2:43 AM  Result Value Ref Range   WBC 5.1 4.0 - 10.5 K/uL   RBC 2.96 (L) 3.87 - 5.11 MIL/uL   Hemoglobin 8.4 (L) 12.0 - 15.0 g/dL   HCT 26.2 (L) 36 - 46 %   MCV 88.5 80.0 - 100.0 fL   MCH 28.4 26.0 - 34.0 pg   MCHC 32.1 30.0 - 36.0 g/dL   RDW 15.7 (H) 11.5 - 15.5 %   Platelets 179 150 -  400 K/uL   nRBC 0.4 (H) 0.0 - 0.2 %    Comment: Performed at [facility], [place] [address], [place], [place] [address]  Basic  metabolic panel     Status: Abnormal   Collection Time: 04/25/20  2:43 AM  Result Value Ref Range   Sodium 141 135 - 145 mmol/L   Potassium 3.3 (L) 3.5 - 5.1 mmol/L   Chloride 109 98 - 111 mmol/L   CO2 19 (L) 22 - 32 mmol/L   Glucose, Bld 100 (H) 70 - 99 mg/dL    Comment: Glucose reference range applies only to samples taken after fasting for at least 8 hours.   BUN 9 8 - 23 mg/dL   Creatinine, Ser 1.95 (H) 0.44 - 1.00 mg/dL   Calcium 8.4 (L) 8.9 - 10.3 mg/dL   GFR calc non Af Amer 27 (L) >60 mL/min   GFR calc Af Amer 31 (L) >60 mL/min   Anion gap 13 5 - 15    Comment: Performed at [facility] [address] [place], [place] [address]   No results found.  Anti-infectives (From admission, onward)   None       Assessment/Plan Hx of CAD with anterior MI 2010 s/p PCI of LAD Ischemic cardiomyopathy with EF 35-40% in 2014 s/p ICD (ECHO 07/2019 with EF 60-65%) HLD Depression CKD stage 3   Symptomatic anemia - s/p 2 units PRBCs 8/4 and 1 unit PRBCs 8/5. Hemoglobin stable at 8.4 today Ascending colon lesion - colonoscopy 8/6 revealed polypoid lesion in the ascending colon concerning for malignancy vs tubulovillous adenoma (this was biopsied and tattooed), as well as one 6 mm polyp in the descending colon that was resected; diverticulosis in the sigmoid colon; internal hemorrhoids - pathology is pending - Patient is anxious to go home but I am concerned that she could re-bleed. May be beneficial to stay admitted and have surgery while she is here, but I will discuss with MD. She has already been started on a diet and would would need repeat colon prep prior to surgery.  - If this lesion is malignant she will need CT scans chest, abdomen, pelvis to evaluate for any metastasis. Check CEA.  - She has been cleared by cardiology as acceptable risk for surgery and recommends no further ischemic workup.  ID - none VTE - SCDs FEN - IVF, HH diet Foley - none Follow up -  TBD  Wellington Hampshire, [facility] Surgery 04/25/2020, 12:11 PM Please see Amion for pager number during day hours 7:00am-4:30pm

## 2020-04-25 NOTE — Discharge Summary (Signed)
[name] NID:[id number] DOB: Feb 08, 1956 DOA: 04/22/2020  PCP: [name] School, MD  Admit date: 04/22/2020 Discharge date: 04/25/2020  Admitted From: Home Disposition: Home  Recommendations for Outpatient Follow-up:  1. Follow up with PCP in 1 week 2. Please obtain BMP/CBC in one week 3. General surgery Dr. Redmond [name] 1 week 4. Follow-up with cardiology in 1 week 5. Follow-up with Dr. Michail [name] GI in 1 week     Discharge Condition:Stable CODE STATUS: Full Diet recommendation: Heart Healthy   Brief/Interim Summary: [name] [name] a 64 y.o.femalewith medical history significant ofchronic systolic CHF with AICD placement; CAD s/p stent placement; and HLD presenting with symptomatic anemia.About 3 weeks ago, she noticed cramps in her left leg. She called Dr. Gerarda [name] and he told her to take flax seed oil and that didn't help the cramps. She started with headaches and dizziness and called cardiology. They stopped Losartan and Coreg and started metoprolol 50 mg BID. It continued and she developed nausea and so they decreased the dose to 25 mg BID. Yesterday, she went to see PA [name] [name] at cardiology and he did blood work. A nurse called this AM and said her Hgb was critically low and 2 other things were abnormal and she should come to the ER. +SOB with significant exertion. Dizziness and headache is when she moves around. +nausea without vomiting, no diarrhea. 10 pounds unintentional weight loss in 2 weeks. +chronic night sweats, unchanged.   Symptomatic anemia -Patient without prior Gi evaluation in her lifetime, presenting with progressive SOB and dizziness with exertion -Hgb 4  -Heme positive, no report of abnormal stools -Normal BUN, likely lower source - concerning for colon malignancy -Hgb 4.0;normal MCV - appears to be more c/w anemia of chronic disease Continue telemetry -Transfused 2 units PRBC  status post EGD and colonoscopy today, see report-cscope with polypoid lesion in  asc colon concerning for malignancy vs tubulovillous adenoma. bx pending.  General surgery saw the patient and will follow up as outpatient and they were okay with patient discharging. Her hemoglobin is stable this AM. Cardiology was consulted for preop risk stratification-patientwould be at acceptable risk for the planned procedure without further cardiovascular testing . Cards recommended aspirin to be on hold and continue with statin and beta-blockers.  Also recommended holding Lasix. .H/h stable, s/p total 4 U prbc. Will need CBC as outpatient once follows up with PCP within 3 days.     AKI on stage 3b CKD--Likely due to prerenal failure secondary to dehydration and continuation of diruetic, and anemia -Baseline creatinine isroughly 1.5.  Creatinine improved after transfusion.  Improved with creatinine 1.76 today -Hold diuretics for now and will need to follow-up with PCP for blood work can be used as as needed Avoid nephrotoxic agents     Chronic systolic CHF with defibrillator in place; h/o CAD -s/p AICD, stent placement -Hold ASA for now -Normalization of EF on most recent (07/2019) echo euvolemic on exam Follow-up with cardiology as outpatient  HTN Continue Lopressor  HLD Continue Crestor  Depression Continue Cymbalta   Discharge Diagnoses:  Principal Problem:   Symptomatic anemia Active Problems:   Hyperlipidemia with target low density lipoprotein (LDL) cholesterol less than 70 mg/dL   CAD, NATIVE VESSEL   SYSTOLIC HEART FAILURE, CHRONIC   Dual implantable cardioverter-defibrillator in situ   Acute renal failure superimposed on stage 3b chronic kidney disease ([place])   Preoperative clearance    Discharge Instructions  Discharge Instructions    Call MD for:  difficulty breathing, headache  or visual disturbances   Complete by: As directed    Diet - low sodium heart healthy   Complete by: As directed    Discharge instructions   Complete by:  As directed    Do not take aspirin until you see cardiology or your pcp, need blood work Need to see pcp in 3 days F/u with surgery   Increase activity slowly   Complete by: As directed      Allergies as of 04/25/2020   No Known Allergies     Medication List    STOP taking these medications   aspirin 81 MG EC tablet   furosemide 20 MG tablet Commonly known as: LASIX     TAKE these medications   acetaminophen 325 MG tablet Commonly known as: TYLENOL Take 650 mg by mouth every 6 (six) hours as needed for mild pain or moderate pain.   DULoxetine 60 MG capsule Commonly known as: CYMBALTA Take 60 mg by mouth daily.   metoprolol tartrate 50 MG tablet Commonly known as: LOPRESSOR Take 0.5 tablets (25 mg total) by mouth 2 (two) times daily.   nitroGLYCERIN 0.4 MG SL tablet Commonly known as: NITROSTAT Place 0.4 mg under the tongue every 5 (five) minutes x 3 doses as needed for chest pain.   pantoprazole 40 MG tablet Commonly known as: Protonix Take 1 tablet (40 mg total) by mouth 2 (two) times daily. What changed: when to take this   potassium chloride SA 20 MEQ tablet Commonly known as: KLOR-CON TAKE 2 TABLETS(40 MEQ) BY MOUTH DAILY What changed: See the new instructions.   rosuvastatin 40 MG tablet Commonly known as: CRESTOR Take 1 tablet (40 mg total) by mouth daily. What changed: when to take this       Follow-up Information    [facility] [place]. Call in 3 day(s).   Specialty: General Surgery Why: to make appt to see one of our colorectal surgeons. Our office will call you on Monday with an appt. If you do not hear from our office by Tuesday 8/11 am, please call  Contact information: [place] [address] [phone]        [name] Pickerel, MD Follow up in 1 week(s).   Specialty: General Surgery Contact information: [address] [place] [address] [phone]        [name] School, MD Follow up in 3  day(s).   Specialty: Internal Medicine Why: needs cbc Contact information: [address] [place] [address] [phone]        [name] Sprang, MD Follow up in 1 week(s).   Specialty: Cardiology Contact information: [address] [address] [phone]        [name] Corner, MD Follow up in 1 week(s).   Specialty: Gastroenterology Contact information: [address] [place] [address] [phone]              No Known Allergies  Consultations:  GI, cardiology, general surgery   Procedures/Studies: No results found.    Subjective: Has no complaints.  Denies shortness of breath, dizziness, chest pain would like to go home.  Discharge Exam: Vitals:   04/25/20 0651 04/25/20 1516  BP: 138/72 111/64  Pulse: 92 79  Resp:  19  Temp: 99.5 F (37.5 C) 99.1 F (37.3 C)  SpO2: 96% 97%   Vitals:   04/24/20 1653 04/24/20 2117 04/25/20 0651 04/25/20 1516  BP: 125/69 123/65 138/72  111/64  Pulse: 88 (!) 102 92 79  Resp: 18 18  19   Temp: 98.1 F (36.7 C) 99.9 F (37.7 C) 99.5 F (37.5 C) 99.1 F (37.3 C)  TempSrc: Oral Oral  Oral  SpO2: 100% 98% 96% 97%  Weight:      Height:        General: Pt is alert, awake, not in acute distress Cardiovascular: RRR, S1/S2 +, no rubs, no gallops Respiratory: CTA bilaterally, no wheezing, no rhonchi Abdominal: Soft, NT, ND, bowel sounds + Extremities: no edema, no cyanosis    The results of significant diagnostics from this hospitalization (including imaging, microbiology, ancillary and laboratory) are listed below for reference.     Microbiology: Recent Results (from the past 240 hour(s))  SARS Coronavirus 2 by RT PCR (hospital order, performed in [facility] hospital lab) Nasopharyngeal Nasopharyngeal Swab     Status: None   Collection Time: 04/22/20  3:22 PM   Specimen: Nasopharyngeal Swab  Result Value Ref Range Status   SARS Coronavirus 2  NEGATIVE NEGATIVE Final    Comment: (NOTE) SARS-CoV-2 target nucleic acids are NOT DETECTED.  The SARS-CoV-2 RNA is generally detectable in upper and lower respiratory specimens during the acute phase of infection. The lowest concentration of SARS-CoV-2 viral copies this assay can detect is 250 copies / mL. A negative result does not preclude SARS-CoV-2 infection and should not be used as the sole basis for treatment or other patient management decisions.  A negative result may occur with improper specimen collection / handling, submission of specimen other than nasopharyngeal swab, presence of viral mutation(s) within the areas targeted by this assay, and inadequate number of viral copies (<250 copies / mL). A negative result must be combined with clinical observations, patient history, and epidemiological information.  Fact Sheet for Patients:   StrictlyIdeas.no  Fact Sheet for Healthcare Providers: BankingDealers.co.za  This test is not yet approved or  cleared by the [place] FDA and has been authorized for detection and/or diagnosis of SARS-CoV-2 by FDA under an Emergency Use Authorization (EUA).  This EUA will remain in effect (meaning this test can be used) for the duration of the COVID-19 declaration under Section 564(b)(1) of the Act, 21 U.S.C. section 360bbb-3(b)(1), unless the authorization is terminated or revoked sooner.  Performed at [facility], [place] [address], [place], [place] [address]      Labs: BNP (last 3 results) No results for input(s): BNP in the last 8760 hours. Basic Metabolic Panel: Recent Labs  Lab 04/21/20 1503 04/22/20 1325 04/23/20 0751 04/24/20 0303 04/25/20 0243  NA 135 135 140 142 141  K 5.3* 3.6 3.6 3.4* 3.3*  CL 105 112* 113* 113* 109  CO2 16* 16* 18* 18* 19*  GLUCOSE 105* 115* 92 93 100*  BUN 15 16 12 10 9   CREATININE 1.95* 2.28* 1.83* 1.76* 1.95*  CALCIUM 9.1 8.9 8.7*  8.7* 8.4*   Liver Function Tests: Recent Labs  Lab 04/21/20 1503 04/22/20 1325  AST 20 26  ALT 36* 35  ALKPHOS 124* 87  BILITOT <0.2 0.6  PROT 6.1 7.0  ALBUMIN 4.0 3.9   No results for input(s): LIPASE, AMYLASE in the last 168 hours. No results for input(s): AMMONIA in the last 168 hours. CBC: Recent Labs  Lab 04/22/20 1325 04/22/20 1325 04/22/20 2205 04/23/20 0751 04/23/20 1554 04/24/20 0303 04/25/20 0243  WBC 5.1  --  5.3 3.9*  --  4.2 5.1  HGB 4.0*   < > 5.7*  7.1* 8.2* 8.4* 8.4*  HCT 14.4*   < > 19.8* 23.0* 26.1* 26.6* 26.2*  MCV 96.0  --  92.1 89.8  --  86.9 88.5  PLT 290  --  245 200  --  181 179   < > = values in this interval not displayed.   Cardiac Enzymes: No results for input(s): CKTOTAL, CKMB, CKMBINDEX, TROPONINI in the last 168 hours. BNP: Invalid input(s): POCBNP CBG: No results for input(s): GLUCAP in the last 168 hours. D-Dimer No results for input(s): DDIMER in the last 72 hours. Hgb A1c No results for input(s): HGBA1C in the last 72 hours. Lipid Profile No results for input(s): CHOL, HDL, LDLCALC, TRIG, CHOLHDL, LDLDIRECT in the last 72 hours. Thyroid function studies No results for input(s): TSH, T4TOTAL, T3FREE, THYROIDAB in the last 72 hours.  Invalid input(s): FREET3 Anemia work up No results for input(s): VITAMINB12, FOLATE, FERRITIN, TIBC, IRON, RETICCTPCT in the last 72 hours. Urinalysis No results found for: COLORURINE, APPEARANCEUR, [place], [place], [place], [place], [place], [place], PROTEINUR, UROBILINOGEN, NITRITE, LEUKOCYTESUR Sepsis Labs Invalid input(s): PROCALCITONIN,  WBC,  LACTICIDVEN Microbiology Recent Results (from the past 240 hour(s))  SARS Coronavirus 2 by RT PCR (hospital order, performed in [facility] hospital lab) Nasopharyngeal Nasopharyngeal Swab     Status: None   Collection Time: 04/22/20  3:22 PM   Specimen: Nasopharyngeal Swab  Result Value Ref Range Status   SARS Coronavirus 2 NEGATIVE NEGATIVE  Final    Comment: (NOTE) SARS-CoV-2 target nucleic acids are NOT DETECTED.  The SARS-CoV-2 RNA is generally detectable in upper and lower respiratory specimens during the acute phase of infection. The lowest concentration of SARS-CoV-2 viral copies this assay can detect is 250 copies / mL. A negative result does not preclude SARS-CoV-2 infection and should not be used as the sole basis for treatment or other patient management decisions.  A negative result may occur with improper specimen collection / handling, submission of specimen other than nasopharyngeal swab, presence of viral mutation(s) within the areas targeted by this assay, and inadequate number of viral copies (<250 copies / mL). A negative result must be combined with clinical observations, patient history, and epidemiological information.  Fact Sheet for Patients:   StrictlyIdeas.no  Fact Sheet for Healthcare Providers: BankingDealers.co.za  This test is not yet approved or  cleared by the [place] FDA and has been authorized for detection and/or diagnosis of SARS-CoV-2 by FDA under an Emergency Use Authorization (EUA).  This EUA will remain in effect (meaning this test can be used) for the duration of the COVID-19 declaration under Section 564(b)(1) of the Act, 21 U.S.C. section 360bbb-3(b)(1), unless the authorization is terminated or revoked sooner.  Performed at [facility], [place] [address] [place], [place] [address]      Time coordinating discharge: Over 30 minutes  SIGNED:   Nolberto Hanlon, MD  [facility] 04/25/2020, 3:39 PM Pager   If 7PM-7AM, please contact night-coverage [email or web address] Password [email or web address]

## 2020-04-27 ENCOUNTER — Telehealth: Payer: Self-pay | Admitting: Cardiovascular Disease

## 2020-04-27 DIAGNOSIS — I5022 Chronic systolic (congestive) heart failure: Secondary | ICD-10-CM

## 2020-04-27 LAB — SURGICAL PATHOLOGY

## 2020-04-27 NOTE — Telephone Encounter (Signed)
Call returned to Pt.  Advised waiting on feedback regarding restarting diuretic.  Pt indicates understanding.

## 2020-04-27 NOTE — Telephone Encounter (Signed)
Pt c/o swelling: STAT is pt has developed SOB within 24 hours  1) How much weight have you gained and in what time span? No  Weight gain   2) If swelling, where is the swelling located? Feet, legs and hands   3) Are you currently taking a fluid pill? No   4) Are you currently SOB? No   5) Do you have a log of your daily weights (if so, list)? No   6) Have you gained 3 pounds in a day or 5 pounds in a week? No  She wants to know if she can start her lasix again because of the swelling.   7) Have you traveled recently? no

## 2020-04-28 ENCOUNTER — Ambulatory Visit: Payer: Self-pay | Admitting: Surgery

## 2020-04-28 ENCOUNTER — Encounter: Payer: Self-pay | Admitting: Surgery

## 2020-04-28 DIAGNOSIS — Z95810 Presence of automatic (implantable) cardiac defibrillator: Secondary | ICD-10-CM | POA: Diagnosis not present

## 2020-04-28 DIAGNOSIS — D500 Iron deficiency anemia secondary to blood loss (chronic): Secondary | ICD-10-CM | POA: Diagnosis not present

## 2020-04-28 DIAGNOSIS — D122 Benign neoplasm of ascending colon: Secondary | ICD-10-CM | POA: Diagnosis not present

## 2020-04-28 DIAGNOSIS — I255 Ischemic cardiomyopathy: Secondary | ICD-10-CM | POA: Diagnosis not present

## 2020-04-28 DIAGNOSIS — K922 Gastrointestinal hemorrhage, unspecified: Secondary | ICD-10-CM | POA: Diagnosis not present

## 2020-04-28 MED ORDER — FUROSEMIDE 20 MG PO TABS
ORAL_TABLET | ORAL | 3 refills | Status: DC
Start: 2020-04-28 — End: 2020-08-26

## 2020-04-28 NOTE — Telephone Encounter (Signed)
I spoke with patient and gave her information from [place], [place].  She is seeing PCP tomorrow.  Dr Gerarda Fraction is not in the office so she will be seeing Dr Hilma Favors.  She will ask to have BMP checked at this visit and results sent to Richardson Dopp, PA Patient has furosemide 20 mg at home and does not need new prescription sent in She will come to our office for BMP on 05/05/20.

## 2020-04-28 NOTE — Addendum Note (Signed)
Addended by: Briant Cedar on: 04/28/2020 04:19 PM   Modules accepted: Orders

## 2020-04-28 NOTE — H&P (Signed)
Jannifer [facility] Appointment: 04/28/2020 11:30 AM Location: [facility] Patient #: [id number] DOB: 1956/04/17 Single / Language: [name] / Race: White Female  History of Present Illness Adin Hector MD; 04/28/2020 1:08 PM) The patient is a 64 year old female who presents with a colonic mass. Note for "Colonic mass": ` ` ` Patient sent for surgical consultation at the request of Dr Cannon Kettle  Chief Complaint: Lower GI bleeding due to large ascending colon polyp. ` ` The patient is a [age]-year-old female. History of coronary disease status post stenting with chronic ischemic cardiomyopathy. Usual ejection fraction around 35-40 percent. Chronic kidney disease. Had worsening fatigue and was found to be severely anemic. Admitted. Given transfusion. Upper and lower endoscopy done. A few small angiodysplastic lesions were noted in the stomach. They do not seem to be bleeding but were treated with argon plasma coagulation. Endoscopy revealed a few small polyps and a much larger polyp in the ascending colon. Seem primarily adenomatous but concern for possible cancer. Biopsies done by Dr. Michail Sermon. Pathology shows adenomatous polyp. This was suspected to be the source of her bleeding and anemia. Surgical consultation made. Discussion about doing surgery. Patient followed by the [facility]. Usually Dr. Sherren Mocha. Cardiology was consulted while the patient was admitted. She did not have any angina symptoms with a hemoglobin of 5.7. Patient notes that she can go up a flight of stairs without difficulty and usually walks her dog for at least a mile or 2 every day without difficulty. Given her excellent exercise tolerance, she was cleared for surgery as needed without further testing. Normally she takes an aspirin but no other aggressive anticoagulation. Patient wished to have outpatient follow-up and therefore was discharged.  Patient returns today, 3 days  after discharge, to consider surgery. She comes today with her daughter. The and questions about the endoscopies. Daughter never heard and feedback from a gastrologist. Patient still smokes. She is cut fact that still does a few cigarettes a day with stress of her recent admission. Has had episodes where she is avoided it. Usually walks her dogs every day. Does get lower extremity swelling. Lasix every other day helps. Wished spent time talking about her dizziness and lightheadedness and other health issues. He is the better after transfusion. Normally moves her bowels every day and has had some issues with constipation the past few months. She did have a hysterectomy when she was 25 for uterine cancer. No chemoradiation. She is not on blood thinners. Not even aspirin.   Family History Problem Relation Age of Onset . Heart attack Mother . Cancer Neg Hx    Past Medical History: Diagnosis Date . Cervical disc disease disabled Engineer, structural . Depression . Dyslipidemia . GERD (gastroesophageal reflux disease) . ICD (implantable cardiac defibrillator), single MDT treated with BMS to LAD . Ischemic cardiomyopathy post-MI LVEF less than 30%; acute AMI Rx w BMS>>LAD . Uterine cancer [facility]) age 78, s/p partial hysterectomy    Past Surgical History: Procedure Laterality Date . BIOPSY 04/24/2020 Procedure: BIOPSY; Surgeon: Wilford Corner, MD; Location: [place]; Service: Endoscopy;; . CERVICAL [place] . COLONOSCOPY WITH PROPOFOL N/A 04/24/2020 Procedure: COLONOSCOPY WITH PROPOFOL; Surgeon: Wilford Corner, MD; Location: [place]; Service: Endoscopy; Laterality: N/A; . CORONARY ANGIOPLASTY WITH STENT PLACEMENT . ESOPHAGOGASTRODUODENOSCOPY (EGD) WITH PROPOFOL N/A 04/24/2020 Procedure: ESOPHAGOGASTRODUODENOSCOPY (EGD) WITH PROPOFOL; Surgeon: Wilford Corner, MD; Location: [place]; Service: Endoscopy; Laterality: N/A; . HOT HEMOSTASIS N/A  04/24/2020 Procedure: HOT HEMOSTASIS (ARGON PLASMA COAGULATION/BICAP); Surgeon: Michail Sermon,  Evette Doffing, MD; Location: [facility] ENDOSCOPY; Service: Endoscopy; Laterality: N/A; . ICD GENERATOR CHANGEOUT N/A 11/17/2017 Procedure: ICD GENERATOR CHANGEOUT; Surgeon: Deboraha Sprang, MD; Location: [facility]; Service: Cardiovascular; Laterality: N/A; . icd implanted . PARTIAL HYSTERECTOMY . partial right masectomy . POLYPECTOMY 04/24/2020 Procedure: POLYPECTOMY; Surgeon: Wilford Corner, MD; Location: [facility] ENDOSCOPY; Service: Endoscopy;; . SUBMUCOSAL TATTOO INJECTION 04/24/2020 Procedure: SUBMUCOSAL TATTOO INJECTION; Surgeon: Wilford Corner, MD; Location: [place]; Service: Endoscopy;;   Social History: reports that she quit smoking about 2 years ago. She has a 10.00 pack-year smoking history. She has never used smokeless tobacco. She reports current alcohol use. She reports that she does not use drugs.  Allergies: No Known Allergies   Medications Prior to Admission Medication Sig Dispense Refill . acetaminophen (TYLENOL) 325 MG tablet Take 650 mg by mouth every 6 (six) hours as needed for mild pain or moderate pain. Marland Kitchen aspirin EC 81 MG EC tablet Take 1 tablet (81 mg total) by mouth daily. 30 tablet 6 . DULoxetine (CYMBALTA) 60 MG capsule Take 60 mg by mouth daily. 3 . furosemide (LASIX) 20 MG tablet TAKE 1 TABLET BY MOUTH EVERY OTHER DAY (Patient taking differently: Take 20 mg by mouth daily. ) 45 tablet 2 . metoprolol tartrate (LOPRESSOR) 50 MG tablet Take 0.5 tablets (25 mg total) by mouth 2 (two) times daily. 90 tablet 3 . nitroGLYCERIN (NITROSTAT) 0.4 MG SL tablet Place 0.4 mg under the tongue every 5 (five) minutes x 3 doses as needed for chest pain. . pantoprazole (PROTONIX) 40 MG tablet Take 1 tablet (40 mg total) by mouth daily. 90 tablet 3 . potassium chloride SA (KLOR-CON) 20 MEQ tablet TAKE 2 TABLETS(40 MEQ) BY MOUTH DAILY (Patient taking differently: Take 40 mEq by mouth  once. ) 180 tablet 3 . rosuvastatin (CRESTOR) 40 MG tablet Take 1 tablet (40 mg total) by mouth daily. (Patient taking differently: Take 40 mg by mouth at bedtime. ) 30 tablet 11    Prior to Admission medications Medication Sig Start Date End Date Taking? Authorizing Provider acetaminophen (TYLENOL) 325 MG tablet Take 650 mg by mouth every 6 (six) hours as needed for mild pain or moderate pain. Yes [provider] aspirin EC 81 MG EC tablet Take 1 tablet (81 mg total) by mouth daily. 05/02/11 Yes Sherren Mocha, MD DULoxetine (CYMBALTA) 60 MG capsule Take 60 mg by mouth daily. 11/25/14 Yes [provider] furosemide (LASIX) 20 MG tablet TAKE 1 TABLET BY MOUTH EVERY OTHER DAY Patient taking differently: Take 20 mg by mouth daily. 12/11/19 Yes Sherren Mocha, MD metoprolol tartrate (LOPRESSOR) 50 MG tablet Take 0.5 tablets (25 mg total) by mouth 2 (two) times daily. 04/14/20 Yes Supple, Megan E, RPH-CPP nitroGLYCERIN (NITROSTAT) 0.4 MG SL tablet Place 0.4 mg under the tongue every 5 (five) minutes x 3 doses as needed for chest pain. Yes [provider] pantoprazole (PROTONIX) 40 MG tablet Take 1 tablet (40 mg total) by mouth daily. 07/24/19 Yes Sherren Mocha, MD potassium chloride SA (KLOR-CON) 20 MEQ tablet TAKE 2 TABLETS(40 MEQ) BY MOUTH DAILY Patient taking differently: Take 40 mEq by mouth once. 09/16/19 Yes Sherren Mocha, MD rosuvastatin (CRESTOR) 40 MG tablet Take 1 tablet (40 mg total) by mouth daily. Patient taking differently: Take 40 mg by mouth at bedtime. 08/12/19 Yes Sherren Mocha, MD    (Review of systems as stated in this history (HPI) or in the review of systems. Otherwise all other 12 point ROS are negative) ` ` `  This patient encounter  took 40 minutes today to perform the following: obtain history, perform exam, review outside records, interpret tests & imaging, counsel the patient on their diagnosis; and, document this encounter, including  findings & plan in the electronic health record (EHR).    Physical Exam Adin Hector MD; 04/28/2020 11:42 AM)  General Mental Status-Alert. General Appearance-Not in acute distress, Not Sickly. Orientation-Oriented X3. Hydration-Well hydrated. Voice-Normal.  Integumentary Global Assessment Upon inspection and palpation of skin surfaces of the - Axillae: non-tender, no inflammation or ulceration, no drainage. and Distribution of scalp and body hair is normal. General Characteristics Temperature - normal warmth is noted.  Head and Neck Head-normocephalic, atraumatic with no lesions or palpable masses. Face Global Assessment - atraumatic, no absence of expression. Neck Global Assessment - no abnormal movements, no bruit auscultated on the right, no bruit auscultated on the left, no decreased range of motion, non-tender. Trachea-midline. Thyroid Gland Characteristics - non-tender.  Eye Eyeball - Left-Extraocular movements intact, No Nystagmus - Left. Eyeball - Right-Extraocular movements intact, No Nystagmus - Right. Cornea - Left-No Hazy - Left. Cornea - Right-No Hazy - Right. Sclera/Conjunctiva - Left-No scleral icterus, No Discharge - Left. Sclera/Conjunctiva - Right-No scleral icterus, No Discharge - Right. Pupil - Left-Direct reaction to light normal. Pupil - Right-Direct reaction to light normal.  ENMT Ears Pinna - Left - no drainage observed, no generalized tenderness observed. Pinna - Right - no drainage observed, no generalized tenderness observed. Nose and Sinuses External Inspection of the Nose - no destructive lesion observed. Inspection of the nares - Left - quiet respiration. Inspection of the nares - Right - quiet respiration. Mouth and Throat Lips - Upper Lip - no fissures observed, no pallor noted. Lower Lip - no fissures observed, no pallor noted. Nasopharynx - no discharge present. Oral Cavity/Oropharynx - Tongue - no  dryness observed. Oral Mucosa - no cyanosis observed. Hypopharynx - no evidence of airway distress observed.  Chest and Lung Exam Inspection Movements - Normal and Symmetrical. Accessory muscles - No use of accessory muscles in breathing. Palpation Palpation of the chest reveals - Non-tender. Auscultation Breath sounds - Normal and Clear.  Cardiovascular Auscultation Rhythm - Regular. Murmurs & Other Heart Sounds - Auscultation of the heart reveals - No Murmurs and No Systolic Clicks.  Abdomen Inspection Inspection of the abdomen reveals - No Visible peristalsis and No Abnormal pulsations. Umbilicus - No Bleeding, No Urine drainage. Palpation/Percussion Palpation and Percussion of the abdomen reveal - Soft, Non Tender, No Rebound tenderness, No Rigidity (guarding) and No Cutaneous hyperesthesia. Note: Abdomen soft. Not severely distended. No distasis recti. No umbilical or other anterior abdominal wall hernias  Female Genitourinary Sexual Maturity Tanner 5 - Adult hair pattern. Note: No vaginal bleeding nor discharge  Peripheral Vascular Upper Extremity Inspection - Left - No Cyanotic nailbeds - Left, Not Ischemic. Inspection - Right - No Cyanotic nailbeds - Right, Not Ischemic.  Neurologic Neurologic evaluation reveals -normal attention span and ability to concentrate, able to name objects and repeat phrases. Appropriate fund of knowledge , normal sensation and normal coordination. Mental Status Affect - not angry, not paranoid. Cranial Nerves-Normal Bilaterally. Gait-Normal.  Neuropsychiatric Mental status exam performed with findings of-able to articulate well with normal speech/language, rate, volume and coherence, thought content normal with ability to perform basic computations and apply abstract reasoning and no evidence of hallucinations, delusions, obsessions or homicidal/suicidal ideation.  Musculoskeletal Global Assessment Spine, Ribs and Pelvis - no  instability, subluxation or laxity. Right Upper Extremity - no instability,  subluxation or laxity.  Lymphatic Head & Neck  General Head & Neck Lymphatics: Bilateral - Description - No Localized lymphadenopathy. Axillary  General Axillary Region: Bilateral - Description - No Localized lymphadenopathy. Femoral & Inguinal  Generalized Femoral & Inguinal Lymphatics: Left - Description - No Localized lymphadenopathy. Right - Description - No Localized lymphadenopathy.    Assessment & Plan Adin Hector MD; 04/28/2020 1:07 PM)  ADENOMATOUS POLYP OF ASCENDING COLON (D12.2) Story: Large ascending colon polyp with symptomatically anemia requiring admission and transfusion of blood. Stabilized. No definite tumor visit suspicious based on large size. Impression: Symptomatic anemia due to chronic blood loss from large adenomatous polyp. Possible malignancy as well.  Standard of care is segmental colonic resection. She will be a good candidate for Robotic proximal colectomy.  Operative risk for increased with her ischemic cardiomyopathy, but she normally has good exercise tolerance. Followed closely by cardiology. They feel that she is optimize and not particularly high risk at this point given her good activity level.   PREOP COLON - ENCOUNTER FOR PREOPERATIVE EXAMINATION FOR GENERAL SURGICAL PROCEDURE (Z01.818)  Current Plans You are being scheduled for surgery- Our schedulers will call you.  You should hear from our office's scheduling department within 5 working days about the location, date, and time of surgery. We try to make accommodations for patient's preferences in scheduling surgery, but sometimes the OR schedule or the surgeon's schedule prevents [place] from making those accommodations.  If you have not heard from our office [phone]) in 5 working days, call the office and ask for your surgeon's nurse.  If you have other questions about your diagnosis, plan, or surgery, call  the office and ask for your surgeon's nurse.  Written instructions provided The anatomy & physiology of the digestive tract was discussed. The pathophysiology of the colon was discussed. Natural history risks without surgery was discussed. I feel the risks of no intervention will lead to serious problems that outweigh the operative risks; therefore, I recommended a partial colectomy to remove the pathology. Minimally invasive (Robotic/Laparoscopic) & open techniques were discussed.  Risks such as bleeding, infection, abscess, leak, reoperation, possible ostomy, hernia, heart attack, death, and other risks were discussed. I noted a good likelihood this will help address the problem. Goals of post-operative recovery were discussed as well. Need for adequate nutrition, daily bowel regimen and healthy physical activity, to optimize recovery was noted as well. We will work to minimize complications. Educational materials were available as well. Questions were answered. The patient expresses understanding & wishes to proceed with surgery.  Pt Education - [facility] Colon Bowel Prep 2018 ERAS/Miralax/Antibiotics Started Neomycin Sulfate 500 MG Oral Tablet, 2 (two) Tablet SEE NOTE, #6, 04/28/2020, No Refill. Local Order: Pharmacist Notes: TAKE TWO TABLETS AT 2 PM, 3 PM, AND 10 PM THE DAY PRIOR TO SURGERY Started Flagyl 500 MG Oral Tablet, 2 (two) Tablet SEE NOTE, #6, 04/28/2020, No Refill. Local Order: Pharmacist Notes: Take at 2pm, 3pm, and 10pm the day prior to your colon operation Pt Education - Pamphlet Given - Laparoscopic Colorectal Surgery: discussed with patient and provided information. Pt Education - [facility] Colectomy post-op instructions: discussed with patient and provided information.  CHRONIC LOWER GI BLEEDING (K92.2)   IRON DEFICIENCY ANEMIA DUE TO CHRONIC BLOOD LOSS (D50.0)   ICD (IMPLANTABLE CARDIOVERTER-DEFIBRILLATOR) IN PLACE (Z95.810) Impression: Double check with [employer] okay  to protect perioperatively   ISCHEMIC CARDIOMYOPATHY (I25.5) Impression: Some ischemic cardiomyopathy. However decent excess tolerance. Seen by cardiology and cleared with her hospitalization  last week.  Quit smoking  Adin Hector, MD, FACS, MASCRS Gastrointestinal and Minimally Invasive Surgery  [facility] [address] [place], [place] [address] [phone] Fax [phone] Main/Paging  CONTACT INFORMATION: Weekday (9AM-5PM) concerns: Call [facility] main office at [phone] Weeknight (5PM-9AM) or Weekend/Holiday concerns: Check [email or web address] for General Surgery [facility] coverage (Please, do not use SecureChat as it is not reliable communication to operating surgeons for immediate patient care)

## 2020-04-28 NOTE — Telephone Encounter (Signed)
She should have a BMET this week - can be done with PCP if that is easier. She can take Furosemide 20 mg once daily only as needed for swelling. Would not take more than 3 x a week for now. If she feels she needs more than that, she should call. Repeat BMET 1 week. Richardson Dopp, PA-C    04/28/2020 10:24 AM

## 2020-04-29 DIAGNOSIS — N183 Chronic kidney disease, stage 3 unspecified: Secondary | ICD-10-CM | POA: Diagnosis not present

## 2020-04-29 DIAGNOSIS — Z6826 Body mass index (BMI) 26.0-26.9, adult: Secondary | ICD-10-CM | POA: Diagnosis not present

## 2020-04-29 DIAGNOSIS — E663 Overweight: Secondary | ICD-10-CM | POA: Diagnosis not present

## 2020-04-29 DIAGNOSIS — K922 Gastrointestinal hemorrhage, unspecified: Secondary | ICD-10-CM | POA: Diagnosis not present

## 2020-04-29 DIAGNOSIS — I420 Dilated cardiomyopathy: Secondary | ICD-10-CM | POA: Diagnosis not present

## 2020-04-29 NOTE — Telephone Encounter (Signed)
Dr. Caryl Comes-- to clarify, do you recommend any changes at this time?  Do driving restrictions apply as episode may have been SVT?

## 2020-05-02 NOTE — Telephone Encounter (Signed)
Agree that it appears to be SVT and so without symptoms  would not feel driving restriction is indicated  Thanks SK

## 2020-05-04 NOTE — Telephone Encounter (Signed)
LMOVM (DPR) advising that driving restriction is not indicated in her case due to SVT per Dr. Caryl Comes. Direct DC number and office hours provided for any questions/concerns.

## 2020-05-05 ENCOUNTER — Other Ambulatory Visit: Payer: Self-pay

## 2020-05-05 ENCOUNTER — Other Ambulatory Visit: Payer: Medicare PPO

## 2020-05-05 DIAGNOSIS — I5022 Chronic systolic (congestive) heart failure: Secondary | ICD-10-CM | POA: Diagnosis not present

## 2020-05-06 LAB — BASIC METABOLIC PANEL
BUN/Creatinine Ratio: 8 — ABNORMAL LOW (ref 12–28)
BUN: 13 mg/dL (ref 8–27)
CO2: 20 mmol/L (ref 20–29)
Calcium: 9.2 mg/dL (ref 8.7–10.3)
Chloride: 105 mmol/L (ref 96–106)
Creatinine, Ser: 1.61 mg/dL — ABNORMAL HIGH (ref 0.57–1.00)
GFR calc Af Amer: 39 mL/min/{1.73_m2} — ABNORMAL LOW (ref >59)
GFR calc non Af Amer: 34 mL/min/{1.73_m2} — ABNORMAL LOW (ref >59)
Glucose: 150 mg/dL — ABNORMAL HIGH (ref 65–99)
Potassium: 5 mmol/L (ref 3.5–5.2)
Sodium: 139 mmol/L (ref 134–144)

## 2020-05-11 NOTE — Progress Notes (Signed)
Cardiology Office Note:    Date:  05/12/2020   ID:  [name] [name], DOB 1955-11-17, MRN [id number]  PCP:  Redmond School, MD  Cardiologist:  Sherren Mocha, MD   Electrophysiologist:  None   Referring MD: Redmond School, MD   Chief Complaint:   Hospitalization Follow-up (admitted with anemia)    Patient Profile:    [name] [name] is a 64 y.o. female with:   Coronary artery disease   S/p anterior MI in 2010 tx with PCI to LAD  Systolic CHF  Ischemic CM; EF 35-40  S/p ICD  Hx of several inappropriate ICD DCs in 09/2015  SVT  Hyperlipidemia   Chronic kidney disease   GERD  Prior CV studies: Echocardiogram 07/31/2019 EF 60-65, normal RVSF, trivial TR - *difficult acoustic windows; images reviewed by Dr. Burt Knack; study felt to demonstrate stable moderate LV dysfunction  Echocardiogram 10/29/2012 EF 35-40, anteroseptal akinesis, inferior and inferoseptal akinesis, GR II DD, PASP 31  Cardiac catheterization 07/23/2009 LM patent with mild plaque LAD ostial 100 AV groove circumflex 30-50, distal severe stenosis; L PDA 80 RCA mid 50 EF 35 PCI: BMS to the LAD  History of Present Illness:    [name] [name] was last seen 04/21/20 for symptoms of dizziness.  Her BP meds had been adjusted several times.  Labs returned with a Hgb of 4 and she was sent to the ED for admission.  She had transfusion with PRBCs and underwent EGD and colonoscopy.  The colonoscopy demonstrated a polypoid lesion in the ascending colon that was biopsied.  There were a few non-bleeding angiodysplastic lesions in the stomach treated with APC.  Pathology was neg for malignancy.  The patient was seen by general surgery who recommended resection of her R colon.  This was planned after DC.  She did see Dr. Radford Pax for Cardiology in the hospital and was felt to be at acceptable risk for surgery.  Of note, she did have some SVT noted on her device interrogation from 02/2020 (see notes in chart).    She returns for follow  up.  She is here alone.  She is feeling much better.  She has not had any further dizziness.  She has not had significant leg swelling or shortness of breath.  She has not had chest pain.    Past Medical History:  Diagnosis Date  . Cervical disc disease    disabled Engineer, structural  . Depression   . Dyslipidemia   . GERD (gastroesophageal reflux disease)   . ICD (implantable cardiac defibrillator), single MDT    treated with BMS to LAD  . Ischemic cardiomyopathy    post-MI LVEF less than 30%; acute AMI Rx w BMS>>LAD  . Uterine cancer [facility])    age 64, s/p partial hysterectomy    Current Medications: Current Meds  Medication Sig  . acetaminophen (TYLENOL) 325 MG tablet Take 650 mg by mouth every 6 (six) hours as needed for mild pain or moderate pain.   . DULoxetine (CYMBALTA) 60 MG capsule Take 60 mg by mouth daily.   . furosemide (LASIX) 20 MG tablet Take one tablet by mouth once daily as needed for swelling. No more than 3 times per week.  . metoprolol tartrate (LOPRESSOR) 50 MG tablet Take 0.5 tablets (25 mg total) by mouth 2 (two) times daily.  . nitroGLYCERIN (NITROSTAT) 0.4 MG SL tablet Place 0.4 mg under the tongue every 5 (five) minutes x 3 doses as needed for chest pain.   . pantoprazole (  PROTONIX) 40 MG tablet Take 1 tablet (40 mg total) by mouth 2 (two) times daily.  . potassium chloride SA (KLOR-CON) 20 MEQ tablet TAKE 2 TABLETS(40 MEQ) BY MOUTH DAILY  . rosuvastatin (CRESTOR) 40 MG tablet Take 1 tablet (40 mg total) by mouth daily.     Allergies:   Patient has no known allergies.   Social History   Tobacco Use  . Smoking status: Former Smoker    Packs/day: 0.50    Years: 20.00    Pack years: 10.00    Quit date: 2019    Years since quitting: 2.6  . Smokeless tobacco: Never Used  Vaping Use  . Vaping Use: Never used  Substance Use Topics  . Alcohol use: Yes    Comment: rare  . Drug use: No     Family Hx: The patient's family history includes Heart attack in  her mother. There is no history of Cancer.  Review of Systems  Gastrointestinal: Negative for hematochezia.      EKGs/Labs/Other Test Reviewed:    EKG:  EKG is  not ordered today.  The ekg ordered today demonstrates n/a  Recent Labs: 04/21/2020: TSH 4.080 04/22/2020: ALT 35 04/25/2020: Hemoglobin 8.4; Platelets 179 05/05/2020: BUN 13; Creatinine, Ser 1.61; Potassium 5.0; Sodium 139   Recent Lipid Panel Lab Results  Component Value Date/Time   CHOL 114 (L) 02/24/2016 10:41 AM   TRIG 146 02/24/2016 10:41 AM   HDL 35 (L) 02/24/2016 10:41 AM   CHOLHDL 3.3 02/24/2016 10:41 AM   LDLCALC 50 02/24/2016 10:41 AM   LDLDIRECT 57 12/02/2019 03:34 PM   LDLDIRECT 70.8 10/27/2010 10:20 AM    Physical Exam:    VS:  BP (!) 98/42   Pulse 84   Ht 5\' 4"  (1.626 m)   Wt 143 lb (64.9 kg)   SpO2 97%   BMI 24.55 kg/m     Wt Readings from Last 3 Encounters:  05/12/20 143 lb (64.9 kg)  04/22/20 146 lb 6.2 oz (66.4 kg)  04/21/20 147 lb 12.8 oz (67 kg)     Constitutional:      Appearance: Healthy appearance. Not in distress.  Neck:     Vascular: JVD normal.  Pulmonary:     Effort: Pulmonary effort is normal.     Breath sounds: No wheezing. No rales.  Cardiovascular:     Normal rate. Regular rhythm. Normal S1. Normal S2.     Murmurs: There is no murmur.  Edema:    Peripheral edema absent.  Abdominal:     Palpations: Abdomen is soft.  Skin:    General: Skin is warm and dry.  Neurological:     Mental Status: Alert and oriented to person, place and time.     Cranial Nerves: Cranial nerves are intact.        ASSESSMENT & PLAN:    1. Chronic systolic CHF (congestive heart failure) (HCC) EF 35-40.  Ischemic CM.  NYHA 2.  Volume status is stable.  She is off of carvedilol and losartan due to low BP.  Continue current dose of metoprolol tartrate and furosemide.  I will see her in 2-3 mos.  This will be after her surgery.  I will see if we can add an ARB back at that time.    2. Coronary  artery disease involving native coronary artery of native heart without angina pectoris Hx of anterior STEMI in 2010 tx with PCI to LAD. She is doing well without angina.  She is not  currently on ASA due to GI bleeding.  This can be resumed when felt to be safe after her surgery.  Continue beta-blocker, statin.    3. Dual implantable cardioverter-defibrillator in situ Follow up with EP as planned.   4. Mass of colon 5. Blood loss anemia As noted, she is to have R colon resection next month with Dr. Johney Maine.  She is at acceptable CV risk and our service is available as needed.  As noted, her ASA should be resumed when felt to be safe from a bleeding standpoint.      Dispo:  Return in about 3 months (around 08/12/2020) for Routine Follow Up, w/ Dr. Burt Knack, or Richardson Dopp, PA-C, in person.   Medication Adjustments/Labs and Tests Ordered: Current medicines are reviewed at length with the patient today.  Concerns regarding medicines are outlined above.  Tests Ordered: No orders of the defined types were placed in this encounter.  Medication Changes: No orders of the defined types were placed in this encounter.   Signed, Richardson Dopp, PA-C  05/12/2020 11:59 AM    [facility] [address], [place], [place]  [address] Phone: [phone]; Fax: [phone]

## 2020-05-12 ENCOUNTER — Encounter: Payer: Self-pay | Admitting: Physician Assistant

## 2020-05-12 ENCOUNTER — Ambulatory Visit: Payer: Medicare PPO | Admitting: Physician Assistant

## 2020-05-12 ENCOUNTER — Other Ambulatory Visit: Payer: Self-pay

## 2020-05-12 VITALS — BP 98/42 | HR 84 | Ht 64.0 in | Wt 143.0 lb

## 2020-05-12 DIAGNOSIS — K6389 Other specified diseases of intestine: Secondary | ICD-10-CM

## 2020-05-12 DIAGNOSIS — I2510 Atherosclerotic heart disease of native coronary artery without angina pectoris: Secondary | ICD-10-CM | POA: Diagnosis not present

## 2020-05-12 DIAGNOSIS — Z95810 Presence of automatic (implantable) cardiac defibrillator: Secondary | ICD-10-CM

## 2020-05-12 DIAGNOSIS — I5022 Chronic systolic (congestive) heart failure: Secondary | ICD-10-CM

## 2020-05-12 DIAGNOSIS — D500 Iron deficiency anemia secondary to blood loss (chronic): Secondary | ICD-10-CM | POA: Diagnosis not present

## 2020-05-12 NOTE — Patient Instructions (Signed)
Medication Instructions:  Your physician recommends that you continue on your current medications as directed. Please refer to the Current Medication list given to you today.  *If you need a refill on your cardiac medications before your next appointment, please call your pharmacy*  Lab Work: None ordered today  If you have labs (blood work) drawn today and your tests are completely normal, you will receive your results only by: Marland Kitchen MyChart Message (if you have MyChart) OR . A paper copy in the mail If you have any lab test that is abnormal or we need to change your treatment, we will call you to review the results.  Testing/Procedures: None ordered today  Follow-Up: On 08/12/20 at 10:45AM with Richardson Dopp, PA-C

## 2020-05-21 NOTE — Progress Notes (Signed)
DUE TO COVID-19 ONLY ONE VISITOR IS ALLOWED TO COME WITH YOU AND STAY IN THE WAITING ROOM ONLY DURING PRE OP AND PROCEDURE DAY OF SURGERY. THE 1 VISITOR  MAY VISIT WITH YOU AFTER SURGERY IN YOUR PRIVATE ROOM DURING VISITING HOURS ONLY!  YOU NEED TO HAVE A COVID 19 TEST ON_______ @_______ , THIS TEST MUST BE DONE BEFORE SURGERY,  COVID TESTING SITE [address] [place] [address], IT IS ON THE RIGHT GOING OUT [address] APPROXIMATELY  2 MINUTES PAST [employer] ON THE RIGHT. ONCE YOUR COVID TEST IS COMPLETED,  PLEASE BEGIN THE QUARANTINE INSTRUCTIONS AS OUTLINED IN YOUR HANDOUT.                [name] [name]  05/21/2020   Your procedure is scheduled on:    Report to [facility] Main  Entrance   Report to admitting at AM     Call this number if you have problems the morning of surgery [phone]           REMEMBER: FOLLOW ALL [place]. DRINK 2 PRESURGERY ENSURE DRINKS THE NIGHT BEFORE SURGERY AT 1000 PM AND 1 PRESURGERY DRINK THE DAY OF THE PROCEDURE 3 HOURS PRIOR TO SCHEDULED SURGERY.  NOTHING BY MOUTH EXCEPT CLEAR LIQUIDS UNTIL THREE HOURS PRIOR TO SCHEDULED SURGERY. PLEASE FINISH PRESURGERY 3RD  ENSURE  DRINK PER SURGEON ORDER 3 HOURS PRIOR TO SCHEDULED SURGERY TIME WHICH NEEDS TO BE COMPLETED AT _________.     CLEAR LIQUID DIET   Foods Allowed                                                                      Coffee and tea, regular and decaf                           Plain Jell-O any favor except red or purple                                         Fruit ices (not with fruit pulp)                                      Iced Popsicles                                     Carbonated beverages, regular and diet                                    Cranberry, grape and apple juices Sports drinks like Gatorade Lightly seasoned clear broth or consume(fat free) Sugar, honey  syrup  _____________________________________________________________________      BRUSH YOUR TEETH MORNING OF SURGERY AND RINSE YOUR MOUTH OUT, NO CHEWING GUM CANDY OR MINTS.     Take these medicines the morning of surgery with A  SIP OF WATER:   DO NOT TAKE ANY DIABETIC MEDICATIONS DAY OF YOUR SURGERY                               You may not have any metal on your body including hair pins and              piercings  Do not wear jewelry, make-up, lotions, powders or perfumes, deodorant             Do not wear nail polish on your fingernails.  Do not shave  48 hours prior to surgery.              Men may shave face and neck.   Do not bring valuables to the hospital. [place].  Contacts, dentures or bridgework may not be worn into surgery.  Leave suitcase in the car. After surgery it may be brought to your room.                 Please read over the following fact sheets you were given: _____________________________________________________________________  [facility] - Preparing for Surgery Before surgery, you can play an important role.  Because skin is not sterile, your skin needs to be as free of germs as possible.  You can reduce the number of germs on your skin by washing with CHG (chlorahexidine gluconate) soap before surgery.  CHG is an antiseptic cleaner which kills germs and bonds with the skin to continue killing germs even after washing. Please DO NOT use if you have an allergy to CHG or antibacterial soaps.  If your skin becomes reddened/irritated stop using the CHG and inform your nurse when you arrive at Short Stay. Do not shave (including legs and underarms) for at least 48 hours prior to the first CHG shower.  You may shave your face/neck. Please follow these instructions carefully:  1.  Shower with CHG Soap the night before surgery and the  morning of Surgery.  2.  If you choose to wash your hair, wash your hair first  as usual with your  normal  shampoo.  3.  After you shampoo, rinse your hair and body thoroughly to remove the  shampoo.                           4.  Use CHG as you would any other liquid soap.  You can apply chg directly  to the skin and wash                       Gently with a scrungie or clean washcloth.  5.  Apply the CHG Soap to your body ONLY FROM THE NECK DOWN.   Do not use on face/ open                           Wound or open sores. Avoid contact with eyes, ears mouth and genitals (private parts).                       Wash face,  Genitals (private parts) with your normal soap.             6.  Wash thoroughly, paying special attention  to the area where your surgery  will be performed.  7.  Thoroughly rinse your body with warm water from the neck down.  8.  DO NOT shower/wash with your normal soap after using and rinsing off  the CHG Soap.                9.  Pat yourself dry with a clean towel.            10.  Wear clean pajamas.            11.  Place clean sheets on your bed the night of your first shower and do not  sleep with pets. Day of Surgery : Do not apply any lotions/deodorants the morning of surgery.  Please wear clean clothes to the hospital/surgery center.  FAILURE TO FOLLOW THESE INSTRUCTIONS MAY RESULT IN THE CANCELLATION OF YOUR SURGERY PATIENT SIGNATURE_________________________________  NURSE SIGNATURE__________________________________  ________________________________________________________________________            DUE TO COVID-19 ONLY ONE VISITOR IS ALLOWED TO COME WITH YOU AND STAY IN THE WAITING ROOM ONLY DURING PRE OP AND PROCEDURE DAY OF SURGERY. THE 1 VISITOR  MAY VISIT WITH YOU AFTER SURGERY IN YOUR PRIVATE ROOM DURING VISITING HOURS ONLY!  YOU NEED TO HAVE A COVID 19 TEST ON___9/7/21____ @_______ , THIS TEST MUST BE DONE BEFORE SURGERY,  COVID TESTING SITE [address] [place] [address], IT IS ON THE RIGHT GOING OUT [address]  APPROXIMATELY  2 MINUTES PAST [employer] ON THE RIGHT. ONCE YOUR COVID TEST IS COMPLETED,  PLEASE BEGIN THE QUARANTINE INSTRUCTIONS AS OUTLINED IN YOUR HANDOUT.                [name] [name]  05/21/2020   Your procedure is scheduled on: 05/29/20   Report to [facility] Main  Entrance   Report to admitting at    0530 AM     Call this number if you have problems the morning of surgery [phone]           REMEMBER: FOLLOW ALL [place]. DRINK 2 PRESURGERY ENSURE DRINKS THE NIGHT BEFORE SURGERY AT 1000 PM AND 1 PRESURGERY DRINK THE DAY OF THE PROCEDURE 3 HOURS PRIOR TO SCHEDULED SURGERY.  NOTHING BY MOUTH EXCEPT CLEAR LIQUIDS UNTIL THREE HOURS PRIOR TO SCHEDULED SURGERY. PLEASE FINISH PRESURGERY 3RD  ENSURE  DRINK PER SURGEON ORDER 3 HOURS PRIOR TO SCHEDULED SURGERY TIME WHICH NEEDS TO BE COMPLETED AT ___0430AM______.     CLEAR LIQUID DIET   Foods Allowed                                                                      Coffee and tea, regular and decaf                           Plain Jell-O any favor except red or purple                                         Fruit ices (not with  fruit pulp)                                      Iced Popsicles                                     Carbonated beverages, regular and diet                                    Cranberry, grape and apple juices Sports drinks like Gatorade Lightly seasoned clear broth or consume(fat free) Sugar, honey syrup  _____________________________________________________________________      BRUSH YOUR TEETH MORNING OF SURGERY AND RINSE YOUR MOUTH OUT, NO CHEWING GUM CANDY OR MINTS.     Take these medicines the morning of surgery with A SIP OF WATER:    CYMBALTA,lOPRESSOR, pROTONIX,                                 You may not have any metal on your body including hair pins and              piercings  Do not wear jewelry,  make-up, lotions, powders or perfumes, deodorant             Do not wear nail polish on your fingernails.  Do not shave  48 hours prior to surgery.              Men may shave face and neck.   Do not bring valuables to the hospital. [place].  Contacts, dentures or bridgework may not be worn into surgery.  Leave suitcase in the car. After surgery it may be brought to your room.                 Please read over the following fact sheets you were given: _____________________________________________________________________  [facility] - Preparing for Surgery Before surgery, you can play an important role.  Because skin is not sterile, your skin needs to be as free of germs as possible.  You can reduce the number of germs on your skin by washing with CHG (chlorahexidine gluconate) soap before surgery.  CHG is an antiseptic cleaner which kills germs and bonds with the skin to continue killing germs even after washing. Please DO NOT use if you have an allergy to CHG or antibacterial soaps.  If your skin becomes reddened/irritated stop using the CHG and inform your nurse when you arrive at Short Stay. Do not shave (including legs and underarms) for at least 48 hours prior to the first CHG shower.  You may shave your face/neck. Please follow these instructions carefully:  1.  Shower with CHG Soap the night before surgery and the  morning of Surgery.  2.  If you choose to wash your hair, wash your hair first as usual with your  normal  shampoo.  3.  After you shampoo, rinse your hair and body thoroughly to remove the  shampoo.  4.  Use CHG as you would any other liquid soap.  You can apply chg directly  to the skin and wash                       Gently with a scrungie or clean washcloth.  5.  Apply the CHG Soap to your body ONLY FROM THE NECK DOWN.   Do not use on face/ open                           Wound or open sores. Avoid  contact with eyes, ears mouth and genitals (private parts).                       Wash face,  Genitals (private parts) with your normal soap.             6.  Wash thoroughly, paying special attention to the area where your surgery  will be performed.  7.  Thoroughly rinse your body with warm water from the neck down.  8.  DO NOT shower/wash with your normal soap after using and rinsing off  the CHG Soap.                9.  Pat yourself dry with a clean towel.            10.  Wear clean pajamas.            11.  Place clean sheets on your bed the night of your first shower and do not  sleep with pets. Day of Surgery : Do not apply any lotions/deodorants the morning of surgery.  Please wear clean clothes to the hospital/surgery center.  FAILURE TO FOLLOW THESE INSTRUCTIONS MAY RESULT IN THE CANCELLATION OF YOUR SURGERY PATIENT SIGNATURE_________________________________  NURSE SIGNATURE__________________________________  ________________________________________________________________________

## 2020-05-26 ENCOUNTER — Encounter (HOSPITAL_COMMUNITY): Admission: RE | Admit: 2020-05-26 | Payer: Medicare PPO | Source: Ambulatory Visit

## 2020-05-26 ENCOUNTER — Encounter (INDEPENDENT_AMBULATORY_CARE_PROVIDER_SITE_OTHER): Payer: Self-pay

## 2020-05-26 ENCOUNTER — Encounter (HOSPITAL_COMMUNITY): Payer: Self-pay

## 2020-05-26 ENCOUNTER — Other Ambulatory Visit: Payer: Self-pay

## 2020-05-26 ENCOUNTER — Other Ambulatory Visit (HOSPITAL_COMMUNITY)
Admission: RE | Admit: 2020-05-26 | Discharge: 2020-05-26 | Disposition: A | Discharge status: Home / Self Care | Payer: Medicare PPO | Source: Ambulatory Visit | Attending: Surgery | Admitting: Surgery

## 2020-05-26 ENCOUNTER — Encounter (HOSPITAL_COMMUNITY)
Admission: RE | Admit: 2020-05-26 | Discharge: 2020-05-26 | Disposition: A | Discharge status: Home / Self Care | Payer: Medicare PPO | Source: Ambulatory Visit | Attending: Surgery | Admitting: Surgery

## 2020-05-26 DIAGNOSIS — E785 Hyperlipidemia, unspecified: Secondary | ICD-10-CM | POA: Diagnosis present

## 2020-05-26 DIAGNOSIS — Z90711 Acquired absence of uterus with remaining cervical stump: Secondary | ICD-10-CM | POA: Diagnosis not present

## 2020-05-26 DIAGNOSIS — I2510 Atherosclerotic heart disease of native coronary artery without angina pectoris: Secondary | ICD-10-CM | POA: Diagnosis present

## 2020-05-26 DIAGNOSIS — Z8542 Personal history of malignant neoplasm of other parts of uterus: Secondary | ICD-10-CM | POA: Diagnosis not present

## 2020-05-26 DIAGNOSIS — D122 Benign neoplasm of ascending colon: Secondary | ICD-10-CM | POA: Diagnosis not present

## 2020-05-26 DIAGNOSIS — K219 Gastro-esophageal reflux disease without esophagitis: Secondary | ICD-10-CM | POA: Diagnosis present

## 2020-05-26 DIAGNOSIS — D120 Benign neoplasm of cecum: Secondary | ICD-10-CM | POA: Diagnosis present

## 2020-05-26 DIAGNOSIS — D500 Iron deficiency anemia secondary to blood loss (chronic): Secondary | ICD-10-CM | POA: Diagnosis present

## 2020-05-26 DIAGNOSIS — I255 Ischemic cardiomyopathy: Secondary | ICD-10-CM | POA: Diagnosis present

## 2020-05-26 DIAGNOSIS — Z955 Presence of coronary angioplasty implant and graft: Secondary | ICD-10-CM | POA: Diagnosis not present

## 2020-05-26 DIAGNOSIS — K635 Polyp of colon: Secondary | ICD-10-CM | POA: Diagnosis not present

## 2020-05-26 DIAGNOSIS — Z95810 Presence of automatic (implantable) cardiac defibrillator: Secondary | ICD-10-CM | POA: Diagnosis not present

## 2020-05-26 DIAGNOSIS — Z87891 Personal history of nicotine dependence: Secondary | ICD-10-CM | POA: Diagnosis not present

## 2020-05-26 DIAGNOSIS — D509 Iron deficiency anemia, unspecified: Secondary | ICD-10-CM | POA: Diagnosis not present

## 2020-05-26 DIAGNOSIS — Z01812 Encounter for preprocedural laboratory examination: Secondary | ICD-10-CM | POA: Insufficient documentation

## 2020-05-26 DIAGNOSIS — Z20822 Contact with and (suspected) exposure to covid-19: Secondary | ICD-10-CM | POA: Diagnosis present

## 2020-05-26 HISTORY — DX: Atherosclerotic heart disease of native coronary artery without angina pectoris: I25.10

## 2020-05-26 HISTORY — DX: Cardiac arrhythmia, unspecified: I49.9

## 2020-05-26 HISTORY — DX: Acute myocardial infarction, unspecified: I21.9

## 2020-05-26 HISTORY — DX: Presence of automatic (implantable) cardiac defibrillator: Z95.810

## 2020-05-26 HISTORY — DX: Anemia, unspecified: D64.9

## 2020-05-26 LAB — BASIC METABOLIC PANEL
Anion gap: 10 (ref 5–15)
BUN: 12 mg/dL (ref 8–23)
CO2: 23 mmol/L (ref 22–32)
Calcium: 9.4 mg/dL (ref 8.9–10.3)
Chloride: 104 mmol/L (ref 98–111)
Creatinine, Ser: 1.49 mg/dL — ABNORMAL HIGH (ref 0.44–1.00)
GFR calc Af Amer: 43 mL/min — ABNORMAL LOW (ref >60)
GFR calc non Af Amer: 37 mL/min — ABNORMAL LOW (ref >60)
Glucose, Bld: 105 mg/dL — ABNORMAL HIGH (ref 70–99)
Potassium: 4.7 mmol/L (ref 3.5–5.1)
Sodium: 137 mmol/L (ref 135–145)

## 2020-05-26 LAB — CBC
HCT: 35.8 % — ABNORMAL LOW (ref 36.0–46.0)
Hemoglobin: 10.8 g/dL — ABNORMAL LOW (ref 12.0–15.0)
MCH: 27.7 pg (ref 26.0–34.0)
MCHC: 30.2 g/dL (ref 30.0–36.0)
MCV: 91.8 fL (ref 80.0–100.0)
Platelets: 199 10*3/uL (ref 150–400)
RBC: 3.9 MIL/uL (ref 3.87–5.11)
RDW: 14.6 % (ref 11.5–15.5)
WBC: 6.6 10*3/uL (ref 4.0–10.5)
nRBC: 0 % (ref 0.0–0.2)

## 2020-05-26 LAB — HEMOGLOBIN A1C
Hgb A1c MFr Bld: 5.6 % (ref 4.8–5.6)
Mean Plasma Glucose: 114.02 mg/dL

## 2020-05-26 LAB — SARS CORONAVIRUS 2 (TAT 6-24 HRS): SARS Coronavirus 2: NEGATIVE

## 2020-05-26 NOTE — Progress Notes (Signed)
At time  Of preop on 05/21/2020.  appt patient staed she had been at least 6 foot away from soneon on 05/21/20  who testeed positive for covid on 05/24/20.  Patient states the person did not have any covid symptoims.  Patient denies any covid symptoms.  Pt to be covid tested today  In preoparation for surgery.  Made Roanna Banning aware NO new orders given.

## 2020-05-26 NOTE — Progress Notes (Signed)
Anesthesia Review:  NTI:RW Fusco  Cardiologist :05/12/20- Office visit with Chelsea Primus- clearance in note  LOV- DR Jolyn Nap- 12/02/19  Last device check- 02/26/20 pt has ICD  Device orders sent on 05/26/20.  Chest x-ray : EKG :02/22/20  Echo :07/31/19  Stress test: Cardiac Cath :  Activity level: can do a flight of stairs without difficulty  Sleep Study/ CPAP :no  Fasting Blood Sugar :      / Checks Blood Sugar -- times a day:   Blood Thinner/ Instructions /Last Dose: ASA / Instructions/ Last Dose :  Please see progress note regarding  covid screening.  Patient

## 2020-05-26 NOTE — Progress Notes (Signed)
CBC done 05/26/20 routed via epic to DR Gross.

## 2020-05-27 ENCOUNTER — Ambulatory Visit (INDEPENDENT_AMBULATORY_CARE_PROVIDER_SITE_OTHER): Payer: Medicare PPO | Admitting: *Deleted

## 2020-05-27 DIAGNOSIS — I255 Ischemic cardiomyopathy: Secondary | ICD-10-CM | POA: Diagnosis not present

## 2020-05-27 LAB — CUP PACEART REMOTE DEVICE CHECK
Battery Remaining Longevity: 109 mo
Battery Voltage: 3 V
Brady Statistic RV Percent Paced: 0.01 %
Date Time Interrogation Session: 20210908063526
HighPow Impedance: 72 Ohm
Implantable Lead Implant Date: 20110110
Implantable Lead Location: 753860
Implantable Lead Model: 6935
Implantable Pulse Generator Implant Date: 20190301
Lead Channel Impedance Value: 437 Ohm
Lead Channel Impedance Value: 494 Ohm
Lead Channel Pacing Threshold Amplitude: 0.75 V
Lead Channel Pacing Threshold Pulse Width: 0.4 ms
Lead Channel Sensing Intrinsic Amplitude: 16.375 mV
Lead Channel Sensing Intrinsic Amplitude: 16.375 mV
Lead Channel Setting Pacing Amplitude: 2.5 V
Lead Channel Setting Pacing Pulse Width: 0.4 ms
Lead Channel Setting Sensing Sensitivity: 0.3 mV

## 2020-05-27 NOTE — Progress Notes (Signed)
Anesthesia Chart Review   Case: [id number] Date/Time: 05/29/20 0715   Procedure: ROBOTIC PROXIMAL COLECTOMY (N/A )   Anesthesia type: General   Pre-op diagnosis: COLYN POLYP UNRESECTABLE BY COLONOSCOPY WITH IRON DEFICIENCY ANEMIA   Location: [facility] ROOM 02 / [facility]   Surgeons: Michael Boston, MD      DISCUSSION:64 y.o. former smoker (10 pack years, quit 09/19/17) with h/o GERD, CAD (STEMI 2010 tx with PCI to LAD), chronic systolic CHF (EF 79-39%), AICD in place (last device check 02/26/2020, device orders requested), colon polym unresectable by colonoscopy scheduled for above procedure 05/29/2020 with Dr. Michael Boston.   Pt last seen by cardiology 05/12/2020.  Per OV note, "As noted, she is to have R colon resection next month with Dr. Johney Maine.  She is at acceptable CV risk and our service is available as needed.  As noted, her ASA should be resumed when felt to be safe from a bleeding standpoint."  Anticipate pt can proceed with planned procedure barring acute status change.   VS: BP 134/64   Pulse 73   Temp 36.7 C (Oral)   Resp 16   Ht 5\' 4"  (1.626 m)   Wt 63.5 kg   SpO2 100%   BMI 24.03 kg/m   PROVIDERS: Redmond School, MD is PCP   Sherren Mocha, MD is Cardiologist  LABS: Labs reviewed: Acceptable for surgery. (all labs ordered are listed, but only abnormal results are displayed)  Labs Reviewed  BASIC METABOLIC PANEL - Abnormal; Notable for the following components:      Result Value   Glucose, Bld 105 (*)    Creatinine, Ser 1.49 (*)    GFR calc non Af Amer 37 (*)    GFR calc Af Amer 43 (*)    All other components within normal limits  CBC - Abnormal; Notable for the following components:   Hemoglobin 10.8 (*)    HCT 35.8 (*)    All other components within normal limits  HEMOGLOBIN A1C     IMAGES:   EKG: 04/23/2020 Rate 78 bpm  NSR Nonspecific ST abnormality   CV: Echo 07/31/2019 IMPRESSIONS    1. Left ventricular ejection fraction, by visual estimation, is 60 to   65%. The left ventricle has normal function. There is no left ventricular  hypertrophy.  2. LV endocardium is not well visualized . Technically difficult study.  3. Global right ventricle has normal systolic function.The right  ventricular size is normal. No increase in right ventricular wall  thickness.  4. Left atrial size was normal.  5. Right atrial size was normal.  6. The mitral valve is normal in structure. No evidence of mitral valve  regurgitation.  7. The tricuspid valve is normal in structure. Tricuspid valve  regurgitation is trivial.  8. The aortic valve is normal in structure. Aortic valve regurgitation is  not visualized.  9. The pulmonic valve was normal in structure. Pulmonic valve  regurgitation is not visualized.  10. Normal pulmonary artery systolic pressure.  11. The atrial septum is grossly normal.  Past Medical History:  Diagnosis Date  . AICD (automatic cardioverter/defibrillator) present   . Anemia   . Cervical disc disease    disabled Engineer, structural  . Coronary artery disease   . Depression   . Dyslipidemia   . Dysrhythmia   . GERD (gastroesophageal reflux disease)   . ICD (implantable cardiac defibrillator), single MDT    treated with BMS to LAD  . Ischemic cardiomyopathy    post-MI LVEF  less than 30%; acute AMI Rx w BMS>>LAD  . Myocardial infarction ([place])   . Uterine cancer [facility])    age 20, s/p partial hysterectomy    Past Surgical History:  Procedure Laterality Date  . ABDOMINAL HYSTERECTOMY    . BIOPSY  04/24/2020   Procedure: BIOPSY;  Surgeon: Wilford Corner, MD;  Location: [place];  Service: Endoscopy;;  . CERVICAL [place]    . COLONOSCOPY WITH PROPOFOL N/A 04/24/2020   Procedure: COLONOSCOPY WITH PROPOFOL;  Surgeon: Wilford Corner, MD;  Location: [place];  Service: Endoscopy;  Laterality: N/A;  . CORONARY ANGIOPLASTY WITH STENT PLACEMENT    . ESOPHAGOGASTRODUODENOSCOPY (EGD) WITH PROPOFOL N/A 04/24/2020    Procedure: ESOPHAGOGASTRODUODENOSCOPY (EGD) WITH PROPOFOL;  Surgeon: Wilford Corner, MD;  Location: [place];  Service: Endoscopy;  Laterality: N/A;  . HOT HEMOSTASIS N/A 04/24/2020   Procedure: HOT HEMOSTASIS (ARGON PLASMA COAGULATION/BICAP);  Surgeon: Wilford Corner, MD;  Location: [place];  Service: Endoscopy;  Laterality: N/A;  . ICD GENERATOR CHANGEOUT N/A 11/17/2017   Procedure: ICD GENERATOR CHANGEOUT;  Surgeon: Deboraha Sprang, MD;  Location: [place] CV LAB;  Service: Cardiovascular;  Laterality: N/A;  . icd implanted    . MASTECTOMY, PARTIAL Right   . PARTIAL HYSTERECTOMY    . POLYPECTOMY  04/24/2020   Procedure: POLYPECTOMY;  Surgeon: Wilford Corner, MD;  Location[facility] ENDOSCOPY;  Service: Endoscopy;;  . SUBMUCOSAL TATTOO INJECTION  04/24/2020   Procedure: SUBMUCOSAL TATTOO INJECTION;  Surgeon: Wilford Corner, MD;  Location: [facility] ENDOSCOPY;  Service: Endoscopy;;    MEDICATIONS: . acetaminophen (TYLENOL) 325 MG tablet  . DULoxetine (CYMBALTA) 60 MG capsule  . furosemide (LASIX) 20 MG tablet  . metoprolol tartrate (LOPRESSOR) 50 MG tablet  . metroNIDAZOLE (FLAGYL) 500 MG tablet  . neomycin (MYCIFRADIN) 500 MG tablet  . nitroGLYCERIN (NITROSTAT) 0.4 MG SL tablet  . pantoprazole (PROTONIX) 40 MG tablet  . potassium chloride SA (KLOR-CON) 20 MEQ tablet  . rosuvastatin (CRESTOR) 40 MG tablet   No current facility-administered medications for this encounter.     Konrad Felix, PA-C [facility] [phone]

## 2020-05-27 NOTE — Anesthesia Preprocedure Evaluation (Addendum)
Anesthesia Evaluation  Patient identified by MRN, date of birth, ID band Patient awake    Reviewed: Allergy & Precautions, NPO status , Patient's Chart, lab work & pertinent test results  History of Anesthesia Complications Negative for: history of anesthetic complications  Airway Mallampati: I  TM Distance: >3 FB Neck ROM: Full    Dental  (+) Edentulous Upper, Edentulous Lower, Dental Advisory Given   Pulmonary neg pulmonary ROS, former smoker,    Pulmonary exam normal        Cardiovascular + CAD and + Cardiac Stents  Normal cardiovascular exam+ Cardiac Defibrillator   Echo 07/31/2019 IMPRESSIONS    1. Left ventricular ejection fraction, by visual estimation, is 60 to  65%. The left ventricle has normal function. There is no left ventricular  hypertrophy.  2. LV endocardium is not well visualized . Technically difficult study.  3. Global right ventricle has normal systolic function.The right  ventricular size is normal. No increase in right ventricular wall  thickness.  4. Left atrial size was normal.  5. Right atrial size was normal.  6. The mitral valve is normal in structure. No evidence of mitral valve  regurgitation.  7. The tricuspid valve is normal in structure. Tricuspid valve  regurgitation is trivial.  8. The aortic valve is normal in structure. Aortic valve regurgitation is  not visualized.  9. The pulmonic valve was normal in structure. Pulmonic valve  regurgitation is not visualized.  10. Normal pulmonary artery systolic pressure.  11. The atrial septum is grossly normal.    Neuro/Psych PSYCHIATRIC DISORDERS Depression negative neurological ROS     GI/Hepatic Neg liver ROS, GERD  ,  Endo/Other  negative endocrine ROS  Renal/GU Renal InsufficiencyRenal disease     Musculoskeletal negative musculoskeletal ROS (+)   Abdominal   Peds  Hematology negative hematology ROS (+)    Anesthesia Other Findings   Reproductive/Obstetrics                           Anesthesia Physical Anesthesia Plan  ASA: III  Anesthesia Plan: General   Post-op Pain Management:    Induction: Intravenous  PONV Risk Score and Plan: 4 or greater and Ondansetron, Dexamethasone, Midazolam and Treatment may vary due to age or medical condition  Airway Management Planned: Oral ETT  Additional Equipment:   Intra-op Plan:   Post-operative Plan: Extubation in OR  Informed Consent: I have reviewed the patients History and Physical, chart, labs and discussed the procedure including the risks, benefits and alternatives for the proposed anesthesia with the patient or authorized representative who has indicated his/her understanding and acceptance.     Dental advisory given  Plan Discussed with: Anesthesiologist and CRNA  Anesthesia Plan Comments: (See PAT note 05/26/2020, Konrad Felix, PA-C)      Anesthesia Quick Evaluation

## 2020-05-28 ENCOUNTER — Encounter: Payer: Self-pay | Admitting: Internal Medicine

## 2020-05-28 NOTE — Progress Notes (Signed)
Remote ICD transmission.   

## 2020-05-28 NOTE — Progress Notes (Unsigned)
PERIOPERATIVE PRESCRIPTION FOR IMPLANTED CARDIAC DEVICE PROGRAMMING  Patient Information: Name:  [name] [name]  DOB:  11-04-1955  MRN:  [id number]  {TIP - You do not have to delete this tip  -  Copy the info from the staff message sent by the PAT staff  then press F2 here and paste the information using CTL - V on the next line :[id number]}  Planned Procedure: Robotic proximal colectomy  Surgeon: Dr. Michael Boston  Date of Procedure: 05/29/2020  Cautery will be used.  Position during surgery: unknown   Please send documentation back to:  [facility] (Fax # [phone])    Device Information:  Clinic EP Physician:  Virl Axe, MD   Device Type:  Defibrillator Manufacturer and Phone #:  [employer]: [phone] Pacemaker Dependent?:  No. Date of Last Device Check:  05/27/20 Normal Device Function?:  Yes.    Electrophysiologist's Recommendations:   Have magnet available.  Provide continuous ECG monitoring when magnet is used or reprogramming is to be performed.   Procedure may interfere with device function.  Magnet should be placed over device during procedure.  Per Device Clinic Standing Orders, Simone Curia, RN  11:54 AM 05/28/2020

## 2020-05-28 NOTE — Progress Notes (Signed)
Called and LVMM at [facility] requesting periop device orders be completed on patient .  Surgery on 05/29/2020.  Have faxed request x 3 .  Gave call back number of [phone].

## 2020-05-29 ENCOUNTER — Inpatient Hospital Stay (HOSPITAL_COMMUNITY): Payer: Medicare PPO | Admitting: Registered Nurse

## 2020-05-29 ENCOUNTER — Inpatient Hospital Stay (HOSPITAL_COMMUNITY): Payer: Medicare PPO | Admitting: Physician Assistant

## 2020-05-29 ENCOUNTER — Encounter (HOSPITAL_COMMUNITY): Payer: Self-pay | Admitting: Surgery

## 2020-05-29 ENCOUNTER — Other Ambulatory Visit: Payer: Self-pay

## 2020-05-29 ENCOUNTER — Encounter (HOSPITAL_COMMUNITY)
Admission: RE | Disposition: A | Discharge status: Home / Self Care | Payer: Self-pay | Source: Home / Self Care | Attending: Surgery

## 2020-05-29 ENCOUNTER — Inpatient Hospital Stay (HOSPITAL_COMMUNITY)
Admission: RE | Admit: 2020-05-29 | Discharge: 2020-05-31 | DRG: 331 | Disposition: A | Discharge status: Home / Self Care | Payer: Medicare PPO | Attending: Surgery | Admitting: Surgery

## 2020-05-29 DIAGNOSIS — K219 Gastro-esophageal reflux disease without esophagitis: Secondary | ICD-10-CM | POA: Diagnosis present

## 2020-05-29 DIAGNOSIS — Z95810 Presence of automatic (implantable) cardiac defibrillator: Secondary | ICD-10-CM | POA: Diagnosis not present

## 2020-05-29 DIAGNOSIS — Z90711 Acquired absence of uterus with remaining cervical stump: Secondary | ICD-10-CM | POA: Diagnosis not present

## 2020-05-29 DIAGNOSIS — Z8542 Personal history of malignant neoplasm of other parts of uterus: Secondary | ICD-10-CM | POA: Diagnosis not present

## 2020-05-29 DIAGNOSIS — D120 Benign neoplasm of cecum: Secondary | ICD-10-CM | POA: Diagnosis present

## 2020-05-29 DIAGNOSIS — I2510 Atherosclerotic heart disease of native coronary artery without angina pectoris: Secondary | ICD-10-CM | POA: Diagnosis present

## 2020-05-29 DIAGNOSIS — D500 Iron deficiency anemia secondary to blood loss (chronic): Secondary | ICD-10-CM | POA: Diagnosis present

## 2020-05-29 DIAGNOSIS — Z87891 Personal history of nicotine dependence: Secondary | ICD-10-CM | POA: Diagnosis not present

## 2020-05-29 DIAGNOSIS — D122 Benign neoplasm of ascending colon: Secondary | ICD-10-CM | POA: Diagnosis present

## 2020-05-29 DIAGNOSIS — Z20822 Contact with and (suspected) exposure to covid-19: Secondary | ICD-10-CM | POA: Diagnosis present

## 2020-05-29 DIAGNOSIS — I255 Ischemic cardiomyopathy: Secondary | ICD-10-CM | POA: Diagnosis present

## 2020-05-29 DIAGNOSIS — M5030 Other cervical disc degeneration, unspecified cervical region: Secondary | ICD-10-CM | POA: Diagnosis present

## 2020-05-29 DIAGNOSIS — Z955 Presence of coronary angioplasty implant and graft: Secondary | ICD-10-CM

## 2020-05-29 DIAGNOSIS — E785 Hyperlipidemia, unspecified: Secondary | ICD-10-CM | POA: Diagnosis present

## 2020-05-29 LAB — TYPE AND SCREEN
ABO/RH(D): O POS
Antibody Screen: NEGATIVE

## 2020-05-29 SURGERY — COLECTOMY, PARTIAL, ROBOT-ASSISTED, LAPAROSCOPIC
Anesthesia: General

## 2020-05-29 MED ORDER — MIDAZOLAM HCL 5 MG/5ML IJ SOLN
INTRAMUSCULAR | Status: DC | PRN
  Administered 2020-05-29: 2 mg via INTRAVENOUS

## 2020-05-29 MED ORDER — DEXAMETHASONE SODIUM PHOSPHATE 10 MG/ML IJ SOLN
INTRAMUSCULAR | Status: DC | PRN
  Administered 2020-05-29: 10 mg via INTRAVENOUS

## 2020-05-29 MED ORDER — BUPIVACAINE-EPINEPHRINE (PF) 0.25% -1:200000 IJ SOLN
INTRAMUSCULAR | Status: AC
  Filled 2020-05-29: qty 60

## 2020-05-29 MED ORDER — ENOXAPARIN SODIUM 40 MG/0.4ML ~~LOC~~ SOLN
40.0000 mg | Freq: Once | SUBCUTANEOUS | Status: AC
  Administered 2020-05-29: 40 mg via SUBCUTANEOUS
  Filled 2020-05-29: qty 0.4

## 2020-05-29 MED ORDER — PHENYLEPHRINE HCL-NACL 10-0.9 MG/250ML-% IV SOLN
INTRAVENOUS | Status: AC
  Filled 2020-05-29: qty 250

## 2020-05-29 MED ORDER — LIP MEDEX EX OINT
1.0000 "application " | TOPICAL_OINTMENT | Freq: Two times a day (BID) | CUTANEOUS | Status: DC
  Administered 2020-05-29 – 2020-05-31 (×3): 1 via TOPICAL

## 2020-05-29 MED ORDER — METHOCARBAMOL 1000 MG/10ML IJ SOLN: 1000.0000 mg | Freq: Four times a day (QID) | INTRAVENOUS | Status: DC | PRN

## 2020-05-29 MED ORDER — PHENYLEPHRINE 40 MCG/ML (10ML) SYRINGE FOR IV PUSH (FOR BLOOD PRESSURE SUPPORT)
PREFILLED_SYRINGE | INTRAVENOUS | Status: DC | PRN
  Administered 2020-05-29: 80 ug via INTRAVENOUS
  Administered 2020-05-29: 120 ug via INTRAVENOUS
  Administered 2020-05-29: 80 ug via INTRAVENOUS
  Administered 2020-05-29: 120 ug via INTRAVENOUS

## 2020-05-29 MED ORDER — TRAMADOL HCL 50 MG PO TABS: 50.0000 mg | ORAL_TABLET | Freq: Four times a day (QID) | ORAL | 0 refills | Status: DC | PRN

## 2020-05-29 MED ORDER — FENTANYL CITRATE (PF) 250 MCG/5ML IJ SOLN
INTRAMUSCULAR | Status: AC
  Filled 2020-05-29: qty 5

## 2020-05-29 MED ORDER — ORAL CARE MOUTH RINSE: 15.0000 mL | Freq: Once | OROMUCOSAL | Status: AC

## 2020-05-29 MED ORDER — ACETAMINOPHEN 500 MG PO TABS
1000.0000 mg | ORAL_TABLET | Freq: Once | ORAL | Status: AC
  Administered 2020-05-29: 1000 mg via ORAL
  Filled 2020-05-29: qty 2

## 2020-05-29 MED ORDER — GABAPENTIN 300 MG PO CAPS
300.0000 mg | ORAL_CAPSULE | ORAL | Status: AC
  Administered 2020-05-29: 300 mg via ORAL
  Filled 2020-05-29: qty 1

## 2020-05-29 MED ORDER — PROCHLORPERAZINE EDISYLATE 10 MG/2ML IJ SOLN: 5.0000 mg | Freq: Four times a day (QID) | INTRAMUSCULAR | Status: DC | PRN

## 2020-05-29 MED ORDER — METHOCARBAMOL 1000 MG/10ML IJ SOLN
1000.0000 mg | Freq: Four times a day (QID) | INTRAVENOUS | Status: DC | PRN
  Filled 2020-05-29: qty 10

## 2020-05-29 MED ORDER — ENALAPRILAT 1.25 MG/ML IV SOLN
0.6250 mg | Freq: Four times a day (QID) | INTRAVENOUS | Status: DC | PRN
  Filled 2020-05-29: qty 1

## 2020-05-29 MED ORDER — METHOCARBAMOL 500 MG PO TABS: 1000.0000 mg | ORAL_TABLET | Freq: Four times a day (QID) | ORAL | Status: DC | PRN

## 2020-05-29 MED ORDER — LACTATED RINGERS IV SOLN: INTRAVENOUS | Status: AC

## 2020-05-29 MED ORDER — ALUM & MAG HYDROXIDE-SIMETH 200-200-20 MG/5ML PO SUSP
30.0000 mL | Freq: Four times a day (QID) | ORAL | Status: DC | PRN
  Filled 2020-05-29: qty 30

## 2020-05-29 MED ORDER — SODIUM CHLORIDE 0.9 % IV SOLN: 250.0000 mL | INTRAVENOUS | Status: DC | PRN

## 2020-05-29 MED ORDER — ACETAMINOPHEN 500 MG PO TABS: 1000.0000 mg | ORAL_TABLET | ORAL | Status: DC

## 2020-05-29 MED ORDER — FENTANYL CITRATE (PF) 250 MCG/5ML IJ SOLN
INTRAMUSCULAR | Status: DC | PRN
Start: 2020-05-29 — End: 2020-05-29
  Administered 2020-05-29: 100 ug via INTRAVENOUS
  Administered 2020-05-29: 50 ug via INTRAVENOUS

## 2020-05-29 MED ORDER — MAGIC MOUTHWASH
15.0000 mL | Freq: Four times a day (QID) | ORAL | Status: DC | PRN
  Filled 2020-05-29: qty 15

## 2020-05-29 MED ORDER — ROSUVASTATIN CALCIUM 20 MG PO TABS
40.0000 mg | ORAL_TABLET | Freq: Every day | ORAL | Status: DC
  Administered 2020-05-29 – 2020-05-30 (×2): 40 mg via ORAL
  Filled 2020-05-29 (×2): qty 2

## 2020-05-29 MED ORDER — PANTOPRAZOLE SODIUM 40 MG PO TBEC
40.0000 mg | DELAYED_RELEASE_TABLET | Freq: Two times a day (BID) | ORAL | Status: DC
  Administered 2020-05-29 – 2020-05-31 (×4): 40 mg via ORAL
  Filled 2020-05-29 (×4): qty 1

## 2020-05-29 MED ORDER — NEOMYCIN SULFATE 500 MG PO TABS: 1000.0000 mg | ORAL_TABLET | ORAL | Status: DC

## 2020-05-29 MED ORDER — PSYLLIUM 95 % PO PACK
1.0000 | PACK | Freq: Two times a day (BID) | ORAL | Status: DC
  Administered 2020-05-29 – 2020-05-31 (×5): 1 via ORAL
  Filled 2020-05-29 (×6): qty 1

## 2020-05-29 MED ORDER — DIPHENHYDRAMINE HCL 12.5 MG/5ML PO ELIX: 12.5000 mg | ORAL_SOLUTION | Freq: Four times a day (QID) | ORAL | Status: DC | PRN

## 2020-05-29 MED ORDER — POLYETHYLENE GLYCOL 3350 17 GM/SCOOP PO POWD: 1.0000 | Freq: Once | ORAL | Status: DC

## 2020-05-29 MED ORDER — BUPIVACAINE LIPOSOME 1.3 % IJ SUSP
INTRAMUSCULAR | Status: DC | PRN
  Administered 2020-05-29: 20 mL

## 2020-05-29 MED ORDER — ONDANSETRON HCL 4 MG/2ML IJ SOLN: 4.0000 mg | Freq: Four times a day (QID) | INTRAMUSCULAR | Status: DC | PRN

## 2020-05-29 MED ORDER — KETAMINE HCL 10 MG/ML IJ SOLN
INTRAMUSCULAR | Status: DC | PRN
  Administered 2020-05-29: 30 mg via INTRAVENOUS

## 2020-05-29 MED ORDER — ROCURONIUM BROMIDE 10 MG/ML (PF) SYRINGE
PREFILLED_SYRINGE | INTRAVENOUS | Status: AC
  Filled 2020-05-29: qty 10

## 2020-05-29 MED ORDER — HYDROMORPHONE HCL 1 MG/ML IJ SOLN: 0.5000 mg | INTRAMUSCULAR | Status: DC | PRN

## 2020-05-29 MED ORDER — BUPIVACAINE-EPINEPHRINE (PF) 0.25% -1:200000 IJ SOLN
INTRAMUSCULAR | Status: DC | PRN
  Administered 2020-05-29: 60 mL

## 2020-05-29 MED ORDER — METOPROLOL TARTRATE 5 MG/5ML IV SOLN: 5.0000 mg | Freq: Four times a day (QID) | INTRAVENOUS | Status: DC | PRN

## 2020-05-29 MED ORDER — SODIUM CHLORIDE (PF) 0.9 % IJ SOLN
INTRAMUSCULAR | Status: AC
  Filled 2020-05-29: qty 50

## 2020-05-29 MED ORDER — METHYLENE BLUE 0.5 % INJ SOLN
INTRAVENOUS | Status: AC
  Filled 2020-05-29: qty 10

## 2020-05-29 MED ORDER — LACTATED RINGERS IV SOLN: INTRAVENOUS | Status: DC

## 2020-05-29 MED ORDER — DULOXETINE HCL 60 MG PO CPEP
60.0000 mg | ORAL_CAPSULE | Freq: Every day | ORAL | Status: DC
  Administered 2020-05-29 – 2020-05-31 (×3): 60 mg via ORAL
  Filled 2020-05-29 (×3): qty 1

## 2020-05-29 MED ORDER — SUGAMMADEX SODIUM 200 MG/2ML IV SOLN
INTRAVENOUS | Status: DC | PRN
  Administered 2020-05-29: 200 mg via INTRAVENOUS

## 2020-05-29 MED ORDER — PROMETHAZINE HCL 25 MG/ML IJ SOLN: 6.2500 mg | INTRAMUSCULAR | Status: DC | PRN

## 2020-05-29 MED ORDER — SODIUM CHLORIDE 0.9% FLUSH
3.0000 mL | Freq: Two times a day (BID) | INTRAVENOUS | Status: DC
  Administered 2020-05-30 (×2): 3 mL via INTRAVENOUS

## 2020-05-29 MED ORDER — LIDOCAINE 2% (20 MG/ML) 5 ML SYRINGE
INTRAMUSCULAR | Status: DC | PRN
  Administered 2020-05-29: 100 mg via INTRAVENOUS

## 2020-05-29 MED ORDER — ACETAMINOPHEN 500 MG PO TABS
1000.0000 mg | ORAL_TABLET | Freq: Four times a day (QID) | ORAL | Status: DC
  Administered 2020-05-29 – 2020-05-31 (×7): 1000 mg via ORAL
  Filled 2020-05-29 (×8): qty 2

## 2020-05-29 MED ORDER — ENSURE PRE-SURGERY PO LIQD
592.0000 mL | Freq: Once | ORAL | Status: DC
  Filled 2020-05-29: qty 592

## 2020-05-29 MED ORDER — LIDOCAINE HCL 2 % IJ SOLN
INTRAMUSCULAR | Status: AC
  Filled 2020-05-29: qty 40

## 2020-05-29 MED ORDER — ONDANSETRON HCL 4 MG PO TABS: 4.0000 mg | ORAL_TABLET | Freq: Four times a day (QID) | ORAL | Status: DC | PRN

## 2020-05-29 MED ORDER — CELECOXIB 200 MG PO CAPS
200.0000 mg | ORAL_CAPSULE | Freq: Once | ORAL | Status: AC
  Administered 2020-05-29: 200 mg via ORAL
  Filled 2020-05-29: qty 1

## 2020-05-29 MED ORDER — LIDOCAINE HCL 1 % IJ SOLN
INTRAMUSCULAR | Status: AC
  Filled 2020-05-29: qty 20

## 2020-05-29 MED ORDER — NITROGLYCERIN 0.4 MG SL SUBL: 0.4000 mg | SUBLINGUAL_TABLET | SUBLINGUAL | Status: DC | PRN

## 2020-05-29 MED ORDER — ROCURONIUM BROMIDE 10 MG/ML (PF) SYRINGE
PREFILLED_SYRINGE | INTRAVENOUS | Status: DC | PRN
  Administered 2020-05-29: 80 mg via INTRAVENOUS

## 2020-05-29 MED ORDER — GABAPENTIN 100 MG PO CAPS
200.0000 mg | ORAL_CAPSULE | Freq: Three times a day (TID) | ORAL | Status: DC
  Administered 2020-05-29 – 2020-05-31 (×6): 200 mg via ORAL
  Filled 2020-05-29 (×6): qty 2

## 2020-05-29 MED ORDER — ENOXAPARIN SODIUM 40 MG/0.4ML ~~LOC~~ SOLN
40.0000 mg | SUBCUTANEOUS | Status: DC
  Administered 2020-05-30 – 2020-05-31 (×2): 40 mg via SUBCUTANEOUS
  Filled 2020-05-29 (×2): qty 0.4

## 2020-05-29 MED ORDER — CHLORHEXIDINE GLUCONATE 0.12 % MT SOLN
15.0000 mL | Freq: Once | OROMUCOSAL | Status: AC
  Administered 2020-05-29: 15 mL via OROMUCOSAL

## 2020-05-29 MED ORDER — ONDANSETRON HCL 4 MG/2ML IJ SOLN
INTRAMUSCULAR | Status: AC
  Filled 2020-05-29: qty 2

## 2020-05-29 MED ORDER — ONDANSETRON HCL 4 MG/2ML IJ SOLN
INTRAMUSCULAR | Status: DC | PRN
  Administered 2020-05-29: 4 mg via INTRAVENOUS

## 2020-05-29 MED ORDER — BISACODYL 5 MG PO TBEC: 20.0000 mg | DELAYED_RELEASE_TABLET | Freq: Once | ORAL | Status: DC

## 2020-05-29 MED ORDER — SODIUM CHLORIDE 0.9% FLUSH: 3.0000 mL | INTRAVENOUS | Status: DC | PRN

## 2020-05-29 MED ORDER — DEXAMETHASONE SODIUM PHOSPHATE 10 MG/ML IJ SOLN
INTRAMUSCULAR | Status: AC
  Filled 2020-05-29: qty 1

## 2020-05-29 MED ORDER — 0.9 % SODIUM CHLORIDE (POUR BTL) OPTIME
TOPICAL | Status: DC | PRN
  Administered 2020-05-29: 2000 mL

## 2020-05-29 MED ORDER — MIDAZOLAM HCL 2 MG/2ML IJ SOLN
INTRAMUSCULAR | Status: AC
  Filled 2020-05-29: qty 2

## 2020-05-29 MED ORDER — METRONIDAZOLE 500 MG PO TABS: 1000.0000 mg | ORAL_TABLET | ORAL | Status: DC

## 2020-05-29 MED ORDER — SODIUM CHLORIDE 0.9 % IV SOLN: Freq: Three times a day (TID) | INTRAVENOUS | Status: DC | PRN

## 2020-05-29 MED ORDER — DIPHENHYDRAMINE HCL 50 MG/ML IJ SOLN: 12.5000 mg | Freq: Four times a day (QID) | INTRAMUSCULAR | Status: DC | PRN

## 2020-05-29 MED ORDER — BUPIVACAINE LIPOSOME 1.3 % IJ SUSP
20.0000 mL | Freq: Once | INTRAMUSCULAR | Status: DC
  Filled 2020-05-29: qty 20

## 2020-05-29 MED ORDER — ENSURE SURGERY PO LIQD
237.0000 mL | Freq: Two times a day (BID) | ORAL | Status: DC
  Administered 2020-05-29 – 2020-05-31 (×4): 237 mL via ORAL

## 2020-05-29 MED ORDER — KETAMINE HCL 10 MG/ML IJ SOLN
INTRAMUSCULAR | Status: AC
  Filled 2020-05-29: qty 1

## 2020-05-29 MED ORDER — PHENYLEPHRINE HCL-NACL 10-0.9 MG/250ML-% IV SOLN
INTRAVENOUS | Status: DC | PRN
  Administered 2020-05-29: 40 ug/min via INTRAVENOUS

## 2020-05-29 MED ORDER — FENTANYL CITRATE (PF) 100 MCG/2ML IJ SOLN
INTRAMUSCULAR | Status: AC
  Administered 2020-05-29: 50 ug via INTRAVENOUS
  Filled 2020-05-29: qty 2

## 2020-05-29 MED ORDER — ENSURE PRE-SURGERY PO LIQD
296.0000 mL | Freq: Once | ORAL | Status: DC
  Filled 2020-05-29: qty 296

## 2020-05-29 MED ORDER — METOPROLOL TARTRATE 25 MG PO TABS
25.0000 mg | ORAL_TABLET | Freq: Two times a day (BID) | ORAL | Status: DC
  Administered 2020-05-29 – 2020-05-30 (×3): 25 mg via ORAL
  Filled 2020-05-29 (×5): qty 1

## 2020-05-29 MED ORDER — PROPOFOL 10 MG/ML IV BOLUS
INTRAVENOUS | Status: AC
  Filled 2020-05-29: qty 20

## 2020-05-29 MED ORDER — ALVIMOPAN 12 MG PO CAPS
12.0000 mg | ORAL_CAPSULE | ORAL | Status: AC
  Administered 2020-05-29: 12 mg via ORAL
  Filled 2020-05-29: qty 1

## 2020-05-29 MED ORDER — LIDOCAINE 2% (20 MG/ML) 5 ML SYRINGE
INTRAMUSCULAR | Status: AC
  Filled 2020-05-29: qty 5

## 2020-05-29 MED ORDER — TRAMADOL HCL 50 MG PO TABS: 50.0000 mg | ORAL_TABLET | Freq: Four times a day (QID) | ORAL | Status: DC | PRN

## 2020-05-29 MED ORDER — SODIUM CHLORIDE 0.9 % IV SOLN
2.0000 g | INTRAVENOUS | Status: AC
  Administered 2020-05-29: 2 g via INTRAVENOUS
  Filled 2020-05-29: qty 2

## 2020-05-29 MED ORDER — PHENYLEPHRINE 40 MCG/ML (10ML) SYRINGE FOR IV PUSH (FOR BLOOD PRESSURE SUPPORT)
PREFILLED_SYRINGE | INTRAVENOUS | Status: AC
  Filled 2020-05-29: qty 10

## 2020-05-29 MED ORDER — LIDOCAINE 20MG/ML (2%) 15 ML SYRINGE OPTIME
INTRAMUSCULAR | Status: DC | PRN
  Administered 2020-05-29: 1.5 mg/kg/h via INTRAVENOUS

## 2020-05-29 MED ORDER — METHOCARBAMOL 500 MG PO TABS
1000.0000 mg | ORAL_TABLET | Freq: Four times a day (QID) | ORAL | Status: DC | PRN
  Administered 2020-05-29: 1000 mg via ORAL
  Filled 2020-05-29: qty 2

## 2020-05-29 MED ORDER — TRAMADOL HCL 50 MG PO TABS
ORAL_TABLET | ORAL | Status: AC
Start: 2020-05-29 — End: 2020-05-29
  Administered 2020-05-29: 50 mg via ORAL
  Filled 2020-05-29: qty 1

## 2020-05-29 MED ORDER — POTASSIUM CHLORIDE CRYS ER 20 MEQ PO TBCR
40.0000 meq | EXTENDED_RELEASE_TABLET | Freq: Every day | ORAL | Status: DC
  Administered 2020-05-29 – 2020-05-31 (×3): 40 meq via ORAL
  Filled 2020-05-29 (×3): qty 2

## 2020-05-29 MED ORDER — SODIUM CHLORIDE 0.9 % IV SOLN
2.0000 g | Freq: Two times a day (BID) | INTRAVENOUS | Status: AC
  Administered 2020-05-29: 2 g via INTRAVENOUS
  Filled 2020-05-29: qty 2

## 2020-05-29 MED ORDER — FENTANYL CITRATE (PF) 100 MCG/2ML IJ SOLN
25.0000 ug | INTRAMUSCULAR | Status: DC | PRN
  Administered 2020-05-29: 50 ug via INTRAVENOUS

## 2020-05-29 MED ORDER — PROCHLORPERAZINE MALEATE 10 MG PO TABS
10.0000 mg | ORAL_TABLET | Freq: Four times a day (QID) | ORAL | Status: DC | PRN
  Filled 2020-05-29: qty 1

## 2020-05-29 MED ORDER — ALVIMOPAN 12 MG PO CAPS
12.0000 mg | ORAL_CAPSULE | Freq: Two times a day (BID) | ORAL | Status: DC
  Administered 2020-05-30: 12 mg via ORAL
  Filled 2020-05-29 (×2): qty 1

## 2020-05-29 MED ORDER — PROPOFOL 10 MG/ML IV BOLUS
INTRAVENOUS | Status: DC | PRN
  Administered 2020-05-29: 150 mg via INTRAVENOUS

## 2020-05-29 MED FILL — traMADol HCL 50 MG TABS: 50 | 3 days supply | Qty: 20 | Fill #0

## 2020-05-29 SURGICAL SUPPLY — 107 items
APPLIER CLIP 5 13 M/L LIGAMAX5 (MISCELLANEOUS)
APPLIER CLIP ROT 10 11.4 M/L (STAPLE)
BLADE EXTENDED COATED 6.5IN (ELECTRODE) IMPLANT
CANNULA REDUC XI 12-8 STAPL (CANNULA)
CANNULA REDUC XI 12-8MM STAPL (CANNULA)
CANNULA REDUCER 12-8 DVNC XI (CANNULA) IMPLANT
CELLS DAT CNTRL 66122 CELL SVR (MISCELLANEOUS) IMPLANT
CHLORAPREP W/TINT 26 (MISCELLANEOUS) IMPLANT
CLIP APPLIE 5 13 M/L LIGAMAX5 (MISCELLANEOUS) IMPLANT
CLIP APPLIE ROT 10 11.4 M/L (STAPLE) IMPLANT
COVER SURGICAL LIGHT HANDLE (MISCELLANEOUS) ×6 IMPLANT
COVER TIP SHEARS 8 DVNC (MISCELLANEOUS) ×1 IMPLANT
COVER TIP SHEARS 8MM DA VINCI (MISCELLANEOUS) ×3
COVER WAND RF STERILE (DRAPES) ×3 IMPLANT
DECANTER SPIKE VIAL GLASS SM (MISCELLANEOUS) ×3 IMPLANT
DEVICE TROCAR PUNCTURE CLOSURE (ENDOMECHANICALS) IMPLANT
DRAIN CHANNEL 19F RND (DRAIN) IMPLANT
DRAPE ARM DVNC X/XI (DISPOSABLE) ×4 IMPLANT
DRAPE COLUMN DVNC XI (DISPOSABLE) ×1 IMPLANT
DRAPE DA VINCI XI ARM (DISPOSABLE) ×12
DRAPE DA VINCI XI COLUMN (DISPOSABLE) ×3
DRAPE SURG IRRIG POUCH 19X23 (DRAPES) ×3 IMPLANT
DRSG OPSITE POSTOP 4X10 (GAUZE/BANDAGES/DRESSINGS) IMPLANT
DRSG OPSITE POSTOP 4X6 (GAUZE/BANDAGES/DRESSINGS) ×3 IMPLANT
DRSG OPSITE POSTOP 4X8 (GAUZE/BANDAGES/DRESSINGS) IMPLANT
DRSG TEGADERM 2-3/8X2-3/4 SM (GAUZE/BANDAGES/DRESSINGS) ×15 IMPLANT
DRSG TEGADERM 4X4.75 (GAUZE/BANDAGES/DRESSINGS) IMPLANT
ELECT PENCIL ROCKER SW 15FT (MISCELLANEOUS) ×3 IMPLANT
ELECT REM PT RETURN 15FT ADLT (MISCELLANEOUS) ×3 IMPLANT
ENDOLOOP SUT PDS II  0 18 (SUTURE)
ENDOLOOP SUT PDS II 0 18 (SUTURE) IMPLANT
EVACUATOR SILICONE 100CC (DRAIN) IMPLANT
GAUZE SPONGE 2X2 8PLY STRL LF (GAUZE/BANDAGES/DRESSINGS) ×1 IMPLANT
GLOVE ECLIPSE 8.0 STRL XLNG CF (GLOVE) ×9 IMPLANT
GLOVE INDICATOR 8.0 STRL GRN (GLOVE) ×9 IMPLANT
GOWN STRL REUS W/TWL XL LVL3 (GOWN DISPOSABLE) ×9 IMPLANT
GRASPER SUT TROCAR 14GX15 (MISCELLANEOUS) IMPLANT
HOLDER FOLEY CATH W/STRAP (MISCELLANEOUS) ×3 IMPLANT
IRRIG SUCT STRYKERFLOW 2 WTIP (MISCELLANEOUS) ×3
IRRIGATION SUCT STRKRFLW 2 WTP (MISCELLANEOUS) ×1 IMPLANT
KIT PROCEDURE DA VINCI SI (MISCELLANEOUS)
KIT PROCEDURE DVNC SI (MISCELLANEOUS) IMPLANT
KIT SIGMOIDOSCOPE (SET/KITS/TRAYS/PACK) IMPLANT
KIT TURNOVER KIT A (KITS) IMPLANT
NEEDLE INSUFFLATION 14GA 120MM (NEEDLE) ×3 IMPLANT
PACK CARDIOVASCULAR III (CUSTOM PROCEDURE TRAY) ×3 IMPLANT
PACK COLON (CUSTOM PROCEDURE TRAY) ×3 IMPLANT
PAD POSITIONING PINK XL (MISCELLANEOUS) ×3 IMPLANT
PORT LAP GEL ALEXIS MED 5-9CM (MISCELLANEOUS) ×3 IMPLANT
PROTECTOR NERVE ULNAR (MISCELLANEOUS) ×6 IMPLANT
RELOAD STAPLER 2.5X60 WHT DVNC (STAPLE) ×1 IMPLANT
RELOAD STAPLER 3.5X45 BLU DVNC (STAPLE) IMPLANT
RELOAD STAPLER 3.5X60 BLU DVNC (STAPLE) ×2 IMPLANT
RELOAD STAPLER 4.3X45 GRN DVNC (STAPLE) IMPLANT
RELOAD STAPLER 4.3X60 GRN DVNC (STAPLE) IMPLANT
RTRCTR WOUND ALEXIS 18CM MED (MISCELLANEOUS)
SCISSORS LAP 5X35 DISP (ENDOMECHANICALS) ×3 IMPLANT
SEAL CANN UNIV 5-8 DVNC XI (MISCELLANEOUS) ×3 IMPLANT
SEAL XI 5MM-8MM UNIVERSAL (MISCELLANEOUS) ×9
SEALER VESSEL DA VINCI XI (MISCELLANEOUS) ×3
SEALER VESSEL EXT DVNC XI (MISCELLANEOUS) ×1 IMPLANT
SOLUTION ELECTROLUBE (MISCELLANEOUS) ×3 IMPLANT
SPONGE GAUZE 2X2 STER 10/PKG (GAUZE/BANDAGES/DRESSINGS) ×2
STAPLER 45 DA VINCI SURE FORM (STAPLE)
STAPLER 45 SUREFORM DVNC (STAPLE) IMPLANT
STAPLER 60 DA VINCI SURE FORM (STAPLE) ×3
STAPLER 60 SUREFORM DVNC (STAPLE) ×1 IMPLANT
STAPLER CANNULA SEAL DVNC XI (STAPLE) ×1 IMPLANT
STAPLER CANNULA SEAL XI (STAPLE) ×3
STAPLER RELOAD 2.5X60 WHITE (STAPLE) ×3
STAPLER RELOAD 2.5X60 WHT DVNC (STAPLE) ×1
STAPLER RELOAD 3.5X45 BLU DVNC (STAPLE)
STAPLER RELOAD 3.5X45 BLUE (STAPLE)
STAPLER RELOAD 3.5X60 BLU DVNC (STAPLE) ×2
STAPLER RELOAD 3.5X60 BLUE (STAPLE) ×6
STAPLER RELOAD 4.3X45 GREEN (STAPLE)
STAPLER RELOAD 4.3X45 GRN DVNC (STAPLE)
STAPLER RELOAD 4.3X60 GREEN (STAPLE)
STAPLER RELOAD 4.3X60 GRN DVNC (STAPLE)
STOPCOCK 4 WAY LG BORE MALE ST (IV SETS) ×6 IMPLANT
SURGILUBE 2OZ TUBE FLIPTOP (MISCELLANEOUS) IMPLANT
SUT MNCRL AB 4-0 PS2 18 (SUTURE) ×3 IMPLANT
SUT PDS AB 1 CT1 27 (SUTURE) ×6 IMPLANT
SUT PROLENE 0 CT 2 (SUTURE) IMPLANT
SUT PROLENE 2 0 KS (SUTURE) IMPLANT
SUT PROLENE 2 0 SH DA (SUTURE) IMPLANT
SUT SILK 2 0 (SUTURE)
SUT SILK 2 0 SH CR/8 (SUTURE) IMPLANT
SUT SILK 2-0 18XBRD TIE 12 (SUTURE) IMPLANT
SUT SILK 3 0 (SUTURE)
SUT SILK 3 0 SH CR/8 (SUTURE) ×3 IMPLANT
SUT SILK 3-0 18XBRD TIE 12 (SUTURE) IMPLANT
SUT V-LOC BARB 180 2/0GR6 GS22 (SUTURE) ×6
SUT VIC AB 3-0 SH 18 (SUTURE) IMPLANT
SUT VIC AB 3-0 SH 27 (SUTURE)
SUT VIC AB 3-0 SH 27XBRD (SUTURE) IMPLANT
SUT VICRYL 0 UR6 27IN ABS (SUTURE) ×6 IMPLANT
SUTURE V-LC BRB 180 2/0GR6GS22 (SUTURE) ×2 IMPLANT
SYR 10ML ECCENTRIC (SYRINGE) ×3 IMPLANT
SYS LAPSCP GELPORT 120MM (MISCELLANEOUS)
SYSTEM LAPSCP GELPORT 120MM (MISCELLANEOUS) IMPLANT
TOWEL OR NON WOVEN STRL DISP B (DISPOSABLE) ×3 IMPLANT
TRAY FOLEY MTR SLVR 16FR STAT (SET/KITS/TRAYS/PACK) ×3 IMPLANT
TROCAR ADV FIXATION 5X100MM (TROCAR) ×3 IMPLANT
TUBING CONNECTING 10 (TUBING) ×4 IMPLANT
TUBING CONNECTING 10' (TUBING) ×2
TUBING INSUFFLATION 10FT LAP (TUBING) ×3 IMPLANT

## 2020-05-29 NOTE — Anesthesia Postprocedure Evaluation (Signed)
Anesthesia Post Note  Patient: [name] [name]  Procedure(s) Performed: ROBOTIC PROXIMAL RIGHT COLECTOMY FOR ASCENDING COLON CANCER WITH TAP BLOCK (N/A )     Patient location during evaluation: PACU Anesthesia Type: General Level of consciousness: sedated Pain management: pain level controlled Vital Signs Assessment: post-procedure vital signs reviewed and stable Respiratory status: spontaneous breathing and respiratory function stable Cardiovascular status: stable Postop Assessment: no apparent nausea or vomiting Anesthetic complications: no   No complications documented.  Last Vitals:  Vitals:   05/29/20 1015 05/29/20 1030  BP: (!) 110/59 (!) 92/51  Pulse: 70 67  Resp: 14 12  Temp:  36.4 C  SpO2: 99% 99%    Last Pain:  Vitals:   05/29/20 1030  TempSrc:   PainSc: Asleep                 Adonijah Baena DANIEL

## 2020-05-29 NOTE — Plan of Care (Signed)

## 2020-05-29 NOTE — Op Note (Signed)
05/29/2020  9:47 AM  PATIENT:  [name] [name]  64 y.o. female  Patient Care Team: Redmond School, MD as PCP - General (Internal Medicine) Sherren Mocha, MD as PCP - Cardiology (Cardiology) Michael Boston, MD as Consulting Physician (General Surgery) Wilford Corner, MD as Consulting Physician (Gastroenterology)  PRE-OPERATIVE DIAGNOSIS:  COLYN POLYP UNRESECTABLE BY COLONOSCOPY WITH IRON DEFICIENCY ANEMIA  POST-OPERATIVE DIAGNOSIS:  COLYN POLYP UNRESECTABLE BY COLONOSCOPY WITH IRON DEFICIENCY ANEMIA  PROCEDURE:  ROBOTIC PROXIMAL RIGHT COLECTOMY TAP BLOCK  SURGEON:  Adin Hector, MD  ASSISTANT: Nadeen Landau, MD An experienced assistant was required given the standard of surgical care given the complexity of the case.  This assistant was needed for exposure, dissection, suctioning, retraction, instrument exchange, etc.   ANESTHESIA:     General  Nerve block provided with liposomal bupivacaine (Experel) mixed with 0.25% bupivacaine as a Bilateral TAP block x 65mL each side at the level of the transverse abdominis & preperitoneal spaces along the flank at the anterior axillary line, from subcostal ridge to iliac crest under laparoscopic guidance   Local field block at port sites & extraction wound  EBL:  Total I/O In: 1300 [I.V.:1200; IV Piggyback:100] Out: 400 [Urine:350; Blood:50]  Delay start of Pharmacological VTE agent (>24hrs) due to surgical blood loss or risk of bleeding:  no  DRAINS: none   SPECIMEN:  PROXIMAL (RIGHT) COLON  DISPOSITION OF SPECIMEN:  PATHOLOGY  COUNTS:  YES  PLAN OF CARE: Admit to inpatient   PATIENT DISPOSITION:  PACU - hemodynamically stable.  INDICATION:    Patient with significant iron deficiency anemia requiring transfusions.  Underwent colonoscopy and found to have bulky mass at ascending colon.  Tattoo did biopsies.  Pathology showing adenomatous polyp.  Felt not safe for endoscopic resection.  Therefore surgical consultation  done.  I recommended segmental resection:  The anatomy & physiology of the digestive tract was discussed.  The pathophysiology was discussed.  Natural history risks without surgery was discussed.   I worked to give an overview of the disease and the frequent need to have multispecialty involvement.  I feel the risks of no intervention will lead to serious problems that outweigh the operative risks; therefore, I recommended a partial colectomy to remove the pathology.  Laparoscopic & open techniques were discussed.   Risks such as bleeding, infection, abscess, leak, reoperation, possible ostomy, hernia, heart attack, death, and other risks were discussed.  I noted a good likelihood this will help address the problem.   Goals of post-operative recovery were discussed as well.  We will work to minimize complications.  An educational handout on the pathology was given as well.  Questions were answered.    The patient expresses understanding & wishes to proceed with surgery.  OR FINDINGS:   Patient had mass at proximal ascending colon near site of tattoo.  Feels mostly soft.  No definite perforation.  No obvious metastatic disease on visceral parietal peritoneum or liver.  It is an ileocolonic anastomosis that rests in the epigastric region CASE DATA:  Type of patient?: Elective [facility] Private Case  Status of Case? Elective Scheduled  Infection Present At Time Of Surgery (PATOS)?  NO  DESCRIPTION:   Informed consent was confirmed.  The patient underwent general anaesthesia without difficulty.  The patient was positioned with arms tucked & secured appropriately.  VTE prevention in place.  The patient's abdomen was clipped, prepped, & draped in a sterile fashion.  Surgical timeout confirmed our plan.  The patient was positioned  in reverse Trendelenburg.  Abdominal entry was gained using optical entry technique in the  left upper abdomen.  Entry was clean.  I induced carbon dioxide insufflation.   Camera inspection revealed no injury.  Extra ports were carefully placed under direct laparoscopic visualization.  Could see tattooing in the right paracolic gutter in the right lower quadrant consistent with suspected ascending colon mass.  I mobilized & reflected the greater omentum and small bowel in the upper abdomen.   I was able to elevate the proximal colon to isolate the ileocolonic pedicle.  I scored the ileal mesentery just proximal to that.   I carried that further dissection in a medial to lateral fashion.  I was able to bluntly get into the retro-mesenteric plane on the right side.  I freed the proximal right sided colonic mesentery off the retroperitoneum including the duodenal sweep, pancreatic head, & Gerota's fascia of the right kidney. I was able to get underneath the hepatic flexure.  I was able to get underneath the proximal and mid transverse colon.  I isolated the proximal ileocecal pedicle.  I skeletonized it & transected the vessels using the vessel sealer    I then proceeded to mobilize the terminal ileum & proximal "right" colon in a lateral to medial fashion.  I mobilized the distal ileal mesentery off its retroperitoneal and pelvic attachments.  I mobilized the ascending colon off It is side wall attachments to the paracolic gutter and retroperitoneum.  I also mobilized the greater omentum off the mid transverse colon and mobilized the mid to proximal transverse colon in a superior to inferior fashion.  This allowed me to mobilize the hepatic flexure and get a complete mobilization of the proximal "right" colon to the mid-transverse colon..  I could isolate the pathology. When he went ahead and proceeded with transection.  I transected the distal ileal mesentery and then transected at the distal ileum with a robotic stapler -60 mm blue load.  I then transected transverse colon mesentery just proximal to a dominant middle colic arterial pedicle radially.  Transected at the proximal  transverse colon with a robotic stapler.  We assured hemostasis.   I did a side-to-side stapled anastomosis of ileum to mid-transverse colon using a 57mm robotic stapler white load x 1 firings in an isoperistaltic fashion.  (Distal stump of ileum to mid transverse colon for the distal end of the anastomosis.  Proximal end of colon stump to more proximal ileum for the proximal end of the anastomosis).  I sewed the common staple channel wound with an absorbable suture ( 2-0 V-lock) in a running [place] fashion from each corner and meeting in the center.  I did meticulous inspection prove an airtight closure.    I protected the anastomosis line with an anterior omentopexy of greater omentum using V lock suture.  We did reinspection of the abdomen.  Hemostasis was good.    Ureters, retroperitoneum, and bowel uninjured.  The anastomosis looked healthy.    Endoluminal gas was evacuated.  We placed the wound protector through the suprapubic 80mm port site after it was enlarged in a Pfannenstiel fashion.  Specimen removed without incident.    Ports & wound protector removed.  Hemostasis was good.  Sterile unused instruments were used from this point.  I closed the skin at the port sites using Monocryl stitch and sterile dressing.  I closed the extraction wound using a 0 Vicryl vertical peritoneal closure and a #1 PDS transverse anterior rectal fascial closure like  a small Pfannenstiel closure. I closed the skin with some interrupted Monocryl stitches. I placed antibiotic-soaked wicks into the closure at the corners x2.  I placed sterile dressings.     Patient is being extubated go to recovery room. I discussed postop care with the patient in detail the office & in the holding area. Instructions are written. I discussed operative findings, updated the patient's status, discussed probable steps to recovery, and gave postoperative recommendations to the patient's daughter.  Recommendations were made.  Questions were  answered.  She expressed understanding & appreciation.  Adin Hector, M.D., F.A.C.S. Gastrointestinal and Minimally Invasive Surgery [facility] [address], [place], [place] [id number] [phone] Main / Paging

## 2020-05-29 NOTE — Interval H&P Note (Signed)
History and Physical Interval Note:  05/29/2020 7:23 AM  [name] [name]  has presented today for surgery, with the diagnosis of [name] [name].  The various methods of treatment have been discussed with the patient and family. After consideration of risks, benefits and other options for treatment, the patient has consented to  Procedure(s): ROBOTIC PROXIMAL COLECTOMY (N/A) as a surgical intervention.  The patient's history has been reviewed, patient examined, no change in status, stable for surgery.  I have reviewed the patient's chart and labs.  Questions were answered to the patient's satisfaction.    I have re-reviewed the the patient's records, history, medications, and allergies.  I have re-examined the patient.  I again discussed intraoperative plans and goals of post-operative recovery.  The patient agrees to proceed.  [name] [name]  [birth year] [id number]  Patient Care Team: Redmond School, MD as PCP - General (Internal Medicine) Sherren Mocha, MD as PCP - Cardiology (Cardiology) Michael Boston, MD as Consulting Physician (General Surgery) Wilford Corner, MD as Consulting Physician (Gastroenterology)  Patient Active Problem List   Diagnosis Date Noted   Preoperative clearance    Symptomatic [name] 04/22/2020   Acute renal failure superimposed on stage 3b chronic kidney disease ([place]) 04/22/2020   VT (ventricular tachycardia) ([place]) 12/01/2019   SVT (supraventricular tachycardia) ([place]) 11/13/2012   Dual implantable cardioverter-defibrillator in situ    Ischemic cardiomyopathy    [place], CHRONIC 10/19/2010   FATIGUE 10/19/2010   CAD, NATIVE VESSEL 01/11/2010   Hyperlipidemia with target low density lipoprotein (LDL) cholesterol less than 70 mg/dL 08/05/2009   DEPRESSION 08/05/2009   GASTROESOPHAGEAL REFLUX DISEASE 08/05/2009   [place] DISEASE, CERVICAL 08/05/2009    Past Medical History:  Diagnosis Date   AICD  (automatic cardioverter/defibrillator) present    [name]    Cervical disc disease    disabled police officer   Coronary artery disease    Depression    Dyslipidemia    Dysrhythmia    GERD (gastroesophageal reflux disease)    ICD (implantable cardiac defibrillator), single MDT    treated with BMS to LAD   Ischemic cardiomyopathy    post-MI LVEF less than 30%; acute AMI Rx w BMS>>LAD   Myocardial infarction [facility])    Uterine cancer [facility])    age 64, s/p partial hysterectomy    Past Surgical History:  Procedure Laterality Date   ABDOMINAL HYSTERECTOMY     BIOPSY  04/24/2020   Procedure: BIOPSY;  Surgeon: Wilford Corner, MD;  Location: [place];  Service: Endoscopy;;   CERVICAL DISC SURGERY     COLONOSCOPY WITH PROPOFOL N/A 04/24/2020   Procedure: COLONOSCOPY WITH PROPOFOL;  Surgeon: Wilford Corner, MD;  Location: [facility] ENDOSCOPY;  Service: Endoscopy;  Laterality: N/A;   CORONARY ANGIOPLASTY WITH STENT PLACEMENT     ESOPHAGOGASTRODUODENOSCOPY (EGD) WITH PROPOFOL N/A 04/24/2020   Procedure: ESOPHAGOGASTRODUODENOSCOPY (EGD) WITH PROPOFOL;  Surgeon: Wilford Corner, MD;  Location: [place];  Service: Endoscopy;  Laterality: N/A;   HOT HEMOSTASIS N/A 04/24/2020   Procedure: HOT HEMOSTASIS (ARGON PLASMA COAGULATION/BICAP);  Surgeon: Wilford Corner, MD;  Location: [place];  Service: Endoscopy;  Laterality: N/A;   ICD GENERATOR CHANGEOUT N/A 11/17/2017   Procedure: ICD GENERATOR CHANGEOUT;  Surgeon: Deboraha Sprang, MD;  Location: [place] CV LAB;  Service: Cardiovascular;  Laterality: N/A;   icd implanted     MASTECTOMY, PARTIAL Right    PARTIAL HYSTERECTOMY     POLYPECTOMY  04/24/2020   Procedure: POLYPECTOMY;  Surgeon: Wilford Corner, MD;  Location: [place] ENDOSCOPY;  Service: Endoscopy;;   SUBMUCOSAL TATTOO INJECTION  04/24/2020   Procedure: SUBMUCOSAL TATTOO INJECTION;  Surgeon: Wilford Corner, MD;  Location: [facility] ENDOSCOPY;  Service: Endoscopy;;    Social History    Socioeconomic History   Marital status: Divorced    Spouse name: Not on file   Number of children: 4   Years of education: 13   Highest education level: Not on file  Occupational History   Not on file  Tobacco Use   Smoking status: Former Smoker    Packs/day: 0.50    Years: 20.00    Pack years: 10.00    Quit date: 2019    Years since quitting: 2.6   Smokeless tobacco: Never Used  Scientific laboratory technician Use: Never used  Substance and Sexual Activity   Alcohol use: Yes    Comment: rare   Drug use: No   Sexual activity: Not on file  Other Topics Concern   Not on file  Social History Narrative   Not on file   Social Determinants of Health   Financial Resource Strain:    Difficulty of Paying Living Expenses: Not on file  Food Insecurity:    Worried About Charity fundraiser in the Last Year: Not on file   [employer] of Food in the Last Year: Not on file  Transportation Needs:    Lack of Transportation (Medical): Not on file   Lack of Transportation (Non-Medical): Not on file  Physical Activity:    Days of Exercise per Week: Not on file   Minutes of Exercise per Session: Not on file  Stress:    Feeling of Stress : Not on file  Social Connections:    Frequency of Communication with Friends and Family: Not on file   Frequency of Social Gatherings with Friends and Family: Not on file   Attends Religious Services: Not on file   Active Member of [place] or Organizations: Not on file   Attends Archivist Meetings: Not on file   Marital Status: Not on file  Intimate Partner Violence:    Fear of Current or Ex-Partner: Not on file   Emotionally Abused: Not on file   Physically Abused: Not on file   Sexually Abused: Not on file    Family History  Problem Relation Age of Onset   Heart attack Mother    Cancer Neg Hx     Medications Prior to Admission  Medication Sig Dispense Refill Last Dose   acetaminophen (TYLENOL) 325 MG tablet Take 650 mg by mouth every 6  (six) hours as needed for mild pain or moderate pain.    Past Month at Unknown time   DULoxetine (CYMBALTA) 60 MG capsule Take 60 mg by mouth daily.   3 05/28/2020 at Unknown time   furosemide (LASIX) 20 MG tablet Take one tablet by mouth once daily as needed for swelling. No more than 3 times per week. (Patient taking differently: Take 20 mg by mouth every Monday, Wednesday, and Friday. ) 12 tablet 3 05/28/2020 at Unknown time   metoprolol tartrate (LOPRESSOR) 50 MG tablet Take 0.5 tablets (25 mg total) by mouth 2 (two) times daily. 90 tablet 3 05/28/2020 at Unknown time   metroNIDAZOLE (FLAGYL) 500 MG tablet Take 1,000 mg by mouth in the morning, at noon, and at bedtime.    05/28/2020 at Unknown time   neomycin (MYCIFRADIN) 500 MG tablet Take 1,000 mg by mouth  3 (three) times daily.   05/28/2020 at Unknown time   nitroGLYCERIN (NITROSTAT) 0.4 MG SL tablet Place 0.4 mg under the tongue every 5 (five) minutes x 3 doses as needed for chest pain.       pantoprazole (PROTONIX) 40 MG tablet Take 1 tablet (40 mg total) by mouth 2 (two) times daily. 60 tablet 0 05/28/2020 at Unknown time   potassium chloride SA (KLOR-CON) 20 MEQ tablet TAKE 2 TABLETS(40 MEQ) BY MOUTH DAILY (Patient taking differently: Take 40 mEq by mouth daily. ) 180 tablet 3 05/28/2020 at Unknown time   rosuvastatin (CRESTOR) 40 MG tablet Take 1 tablet (40 mg total) by mouth daily. (Patient taking differently: Take 40 mg by mouth at bedtime. ) 30 tablet 11 05/28/2020 at Unknown time    Current Facility-Administered Medications  Medication Dose Route Frequency Provider Last Rate Last Admin   bupivacaine liposome (EXPAREL) 1.3 % injection 266 mg  20 mL Infiltration Once Michael Boston, MD       cefoTEtan (CEFOTAN) 2 g in sodium chloride 0.9 % 100 mL IVPB  2 g Intravenous On Call to OR Michael Boston, MD       [START ON 05/30/2020] feeding supplement (ENSURE PRE-SURGERY) liquid 296 mL  296 mL Oral Once Michael Boston, MD       feeding supplement (ENSURE  PRE-SURGERY) liquid 592 mL  592 mL Oral Once Michael Boston, MD       lactated ringers infusion   Intravenous Continuous Ellender, Karyl Kinnier, MD 10 mL/hr at 05/29/20 0644 Continued from Pre-op at 05/29/20 0644   lidocaine (XYLOCAINE) 1 % (with pres) injection            phenylephrine (NEOSYNEPHRINE) 10-0.9 MG/250ML-% infusion              No Known Allergies  BP 114/66   Pulse 71   Temp 98.4 F (36.9 C) (Oral)   Resp 16   SpO2 99%   Labs: Results for orders placed or performed during the hospital encounter of 05/29/20 (from the past 48 hour(s))  Type and screen [place]     Status: None   Collection Time: 05/29/20  6:12 AM  Result Value Ref Range   ABO/RH(D) O POS    Antibody Screen NEG    Sample Expiration      06/01/2020,2359 Performed at [facility], [place] [address], [place], [place] [address]     Imaging / Studies: CUP PACEART REMOTE DEVICE CHECK  Result Date: 05/27/2020 Scheduled remote reviewed. Normal device function.  1 NSVT 12 beats 190's Next remote 91 days. JM    .Adin Hector, M.D., F.A.C.S. Gastrointestinal and Minimally Invasive Surgery [facility] [address] [place], [place] [address] [phone] Main / Paging  05/29/2020 7:24 AM    Adin Hector

## 2020-05-29 NOTE — Anesthesia Procedure Notes (Signed)
Procedure Name: Intubation Date/Time: 05/29/2020 7:38 AM Performed by: Talbot Grumbling, CRNA Pre-anesthesia Checklist: Patient identified, Emergency Drugs available, Suction available and Patient being monitored Patient Re-evaluated:Patient Re-evaluated prior to induction Oxygen Delivery Method: Circle system utilized Preoxygenation: Pre-oxygenation with 100% oxygen Induction Type: IV induction Ventilation: Mask ventilation without difficulty Laryngoscope Size: Mac and 3 Grade View: Grade I Tube type: Oral Tube size: 7.0 mm Number of attempts: 1 Airway Equipment and Method: Stylet Placement Confirmation: ETT inserted through vocal cords under direct vision,  positive ETCO2 and breath sounds checked- equal and bilateral Secured at: 21 cm Tube secured with: Tape Dental Injury: Teeth and Oropharynx as per pre-operative assessment

## 2020-05-29 NOTE — H&P (Signed)
[name] [name] DOB: Jan 09, 1956 Single / Language: [name] [name] / Race: White Female  ` ` Patient sent for surgical consultation at the request of Dr Cannon Kettle  Chief Complaint: Lower GI bleeding due to large ascending colon polyp. ` ` The patient is a 64-year-old female. History of coronary disease status post stenting with chronic ischemic cardiomyopathy. Usual ejection fraction around 35-40 percent. Chronic kidney disease. Had worsening fatigue and was found to be severely anemic. Admitted. Given transfusion. Upper and lower endoscopy done. A few small angiodysplastic lesions were noted in the stomach. They do not seem to be bleeding but were treated with argon plasma coagulation. Endoscopy revealed a few small polyps and a much larger polyp in the ascending colon. Seem primarily adenomatous but concern for possible cancer. Biopsies done by Dr. Michail Sermon. Pathology shows adenomatous polyp. This was suspected to be the source of her bleeding and anemia. Surgical consultation made. Discussion about doing surgery. Patient followed by the [place] [facility]. Usually Dr. Sherren Mocha. Cardiology was consulted while the patient was admitted. She did not have any angina symptoms with a hemoglobin of 5.7. Patient notes that she can go up a flight of stairs without difficulty and usually walks her dog for at least a mile or 2 every day without difficulty. Given her excellent exercise tolerance, she was cleared for surgery as needed without further testing. Normally she takes an aspirin but no other aggressive anticoagulation. Patient wished to have outpatient follow-up and therefore was discharged.  Patient returns today, 3 days after discharge, to consider surgery. She comes today with her daughter. The and questions about the endoscopies. Daughter never heard and feedback from a gastrologist. Patient still smokes. She is cut fact that still does a few cigarettes a day  with stress of her recent admission. Has had episodes where she is avoided it. Usually walks her dogs every day. Does get lower extremity swelling. Lasix every other day helps. Wished spent time talking about her dizziness and lightheadedness and other health issues. He is the better after transfusion. Normally moves her bowels every day and has had some issues with constipation the past few months. She did have a hysterectomy when she was 25 for uterine cancer. No chemoradiation. She is not on blood thinners. Not even aspirin.   Family History Problem Relation Age of Onset . Heart attack Mother . Cancer Neg Hx    Past Medical History: Diagnosis Date . Cervical disc disease disabled Engineer, structural . Depression . Dyslipidemia . GERD (gastroesophageal reflux disease) . ICD (implantable cardiac defibrillator), single MDT treated with BMS to LAD . Ischemic cardiomyopathy post-MI LVEF less than 30%; acute AMI Rx w BMS>>LAD . Uterine cancer [name] [name]) age 59, s/p partial hysterectomy    Past Surgical History: Procedure Laterality Date . BIOPSY 04/24/2020 Procedure: BIOPSY; Surgeon: Wilford Corner, MD; Location: [place]; Service: Endoscopy;; . CERVICAL [place] . COLONOSCOPY WITH PROPOFOL N/A 04/24/2020 Procedure: COLONOSCOPY WITH PROPOFOL; Surgeon: Wilford Corner, MD; Location: [place]; Service: Endoscopy; Laterality: N/A; . CORONARY ANGIOPLASTY WITH STENT PLACEMENT . ESOPHAGOGASTRODUODENOSCOPY (EGD) WITH PROPOFOL N/A 04/24/2020 Procedure: ESOPHAGOGASTRODUODENOSCOPY (EGD) WITH PROPOFOL; Surgeon: Wilford Corner, MD; Location: [place]; Service: Endoscopy; Laterality: N/A; . HOT HEMOSTASIS N/A 04/24/2020 Procedure: HOT HEMOSTASIS (ARGON PLASMA COAGULATION/BICAP); Surgeon: Wilford Corner, MD; Location: [place]; Service: Endoscopy; Laterality: N/A; . ICD GENERATOR CHANGEOUT N/A 11/17/2017 Procedure: ICD GENERATOR CHANGEOUT; Surgeon:  Deboraha Sprang, MD; Location: [facility] [facility]; Service: Cardiovascular; Laterality: N/A; . icd implanted . PARTIAL HYSTERECTOMY . partial  right masectomy . POLYPECTOMY 04/24/2020 Procedure: POLYPECTOMY; Surgeon: Wilford Corner, MD; Location: [name]; Service: Endoscopy;; . SUBMUCOSAL TATTOO INJECTION 04/24/2020 Procedure: SUBMUCOSAL TATTOO INJECTION; Surgeon: Wilford Corner, MD; Location: [place]; Service: Endoscopy;;   Social History: reports that she quit smoking about 2 years ago. She has a 10.00 pack-year smoking history. She has never used smokeless tobacco. She reports current alcohol use. She reports that she does not use drugs.  Allergies: No Known Allergies   Medications Prior to Admission Medication Sig Dispense Refill . acetaminophen (TYLENOL) 325 MG tablet Take 650 mg by mouth every 6 (six) hours as needed for mild pain or moderate pain. Marland Kitchen aspirin EC 81 MG EC tablet Take 1 tablet (81 mg total) by mouth daily. 30 tablet 6 . DULoxetine (CYMBALTA) 60 MG capsule Take 60 mg by mouth daily. 3 . furosemide (LASIX) 20 MG tablet TAKE 1 TABLET BY MOUTH EVERY OTHER DAY (Patient taking differently: Take 20 mg by mouth daily. ) 45 tablet 2 . metoprolol tartrate (LOPRESSOR) 50 MG tablet Take 0.5 tablets (25 mg total) by mouth 2 (two) times daily. 90 tablet 3 . nitroGLYCERIN (NITROSTAT) 0.4 MG SL tablet Place 0.4 mg under the tongue every 5 (five) minutes x 3 doses as needed for chest pain. . pantoprazole (PROTONIX) 40 MG tablet Take 1 tablet (40 mg total) by mouth daily. 90 tablet 3 . potassium chloride SA (KLOR-CON) 20 MEQ tablet TAKE 2 TABLETS(40 MEQ) BY MOUTH DAILY (Patient taking differently: Take 40 mEq by mouth once. ) 180 tablet 3 . rosuvastatin (CRESTOR) 40 MG tablet Take 1 tablet (40 mg total) by mouth daily. (Patient taking differently: Take 40 mg by mouth at bedtime. ) 30 tablet 11    Prior to Admission medications Medication Sig Start Date End  Date Taking? Authorizing Provider acetaminophen (TYLENOL) 325 MG tablet Take 650 mg by mouth every 6 (six) hours as needed for mild pain or moderate pain. Yes [provider] aspirin EC 81 MG EC tablet Take 1 tablet (81 mg total) by mouth daily. 05/02/11 Yes Sherren Mocha, MD DULoxetine (CYMBALTA) 60 MG capsule Take 60 mg by mouth daily. 11/25/14 Yes [provider] furosemide (LASIX) 20 MG tablet TAKE 1 TABLET BY MOUTH EVERY OTHER DAY Patient taking differently: Take 20 mg by mouth daily. 12/11/19 Yes Sherren Mocha, MD metoprolol tartrate (LOPRESSOR) 50 MG tablet Take 0.5 tablets (25 mg total) by mouth 2 (two) times daily. 04/14/20 Yes Supple, Megan E, RPH-CPP nitroGLYCERIN (NITROSTAT) 0.4 MG SL tablet Place 0.4 mg under the tongue every 5 (five) minutes x 3 doses as needed for chest pain. Yes [provider] pantoprazole (PROTONIX) 40 MG tablet Take 1 tablet (40 mg total) by mouth daily. 07/24/19 Yes Sherren Mocha, MD potassium chloride SA (KLOR-CON) 20 MEQ tablet TAKE 2 TABLETS(40 MEQ) BY MOUTH DAILY Patient taking differently: Take 40 mEq by mouth once. 09/16/19 Yes Sherren Mocha, MD rosuvastatin (CRESTOR) 40 MG tablet Take 1 tablet (40 mg total) by mouth daily. Patient taking differently: Take 40 mg by mouth at bedtime. 08/12/19 Yes Sherren Mocha, MD    (Review of systems as stated in this history (HPI) or in the review of systems. Otherwise all other 12 point ROS are negative) ` ` `  This patient encounter took 40 minutes today to perform the following: obtain history, perform exam, review outside records, interpret tests & imaging, counsel the patient on their diagnosis; and, document this encounter, including findings & plan in the electronic health record (EHR).  BP  114/66   Pulse 71   Temp 98.4 F (36.9 C) (Oral)   Resp 16   SpO2 99%  05/29/2020   Physical Exam Adin Hector MD; 04/28/2020 11:42 AM)  General Mental  Status-Alert. General Appearance-Not in acute distress, Not Sickly. Orientation-Oriented X3. Hydration-Well hydrated. Voice-Normal.  Integumentary Global Assessment Upon inspection and palpation of skin surfaces of the - Axillae: non-tender, no inflammation or ulceration, no drainage. and Distribution of scalp and body hair is normal. General Characteristics Temperature - normal warmth is noted.  Head and Neck Head-normocephalic, atraumatic with no lesions or palpable masses. Face Global Assessment - atraumatic, no absence of expression. Neck Global Assessment - no abnormal movements, no bruit auscultated on the right, no bruit auscultated on the left, no decreased range of motion, non-tender. Trachea-midline. Thyroid Gland Characteristics - non-tender.  Eye Eyeball - Left-Extraocular movements intact, No Nystagmus - Left. Eyeball - Right-Extraocular movements intact, No Nystagmus - Right. Cornea - Left-No Hazy - Left. Cornea - Right-No Hazy - Right. Sclera/Conjunctiva - Left-No scleral icterus, No Discharge - Left. Sclera/Conjunctiva - Right-No scleral icterus, No Discharge - Right. Pupil - Left-Direct reaction to light normal. Pupil - Right-Direct reaction to light normal.  ENMT Ears Pinna - Left - no drainage observed, no generalized tenderness observed. Pinna - Right - no drainage observed, no generalized tenderness observed. Nose and Sinuses External Inspection of the Nose - no destructive lesion observed. Inspection of the nares - Left - quiet respiration. Inspection of the nares - Right - quiet respiration. Mouth and Throat Lips - Upper Lip - no fissures observed, no pallor noted. Lower Lip - no fissures observed, no pallor noted. Nasopharynx - no discharge present. Oral Cavity/Oropharynx - Tongue - no dryness observed. Oral Mucosa - no cyanosis observed. Hypopharynx - no evidence of airway distress observed.  Chest and Lung  Exam Inspection Movements - Normal and Symmetrical. Accessory muscles - No use of accessory muscles in breathing. Palpation Palpation of the chest reveals - Non-tender. Auscultation Breath sounds - Normal and Clear.  Cardiovascular Auscultation Rhythm - Regular. Murmurs & Other Heart Sounds - Auscultation of the heart reveals - No Murmurs and No Systolic Clicks.  Abdomen Inspection Inspection of the abdomen reveals - No Visible peristalsis and No Abnormal pulsations. Umbilicus - No Bleeding, No Urine drainage. Palpation/Percussion Palpation and Percussion of the abdomen reveal - Soft, Non Tender, No Rebound tenderness, No Rigidity (guarding) and No Cutaneous hyperesthesia. Note: Abdomen soft. Not severely distended. No distasis recti. No umbilical or other anterior abdominal wall hernias  Female Genitourinary Sexual Maturity Tanner 5 - Adult hair pattern. Note: No vaginal bleeding nor discharge  Peripheral Vascular Upper Extremity Inspection - Left - No Cyanotic nailbeds - Left, Not Ischemic. Inspection - Right - No Cyanotic nailbeds - Right, Not Ischemic.  Neurologic Neurologic evaluation reveals -normal attention span and ability to concentrate, able to name objects and repeat phrases. Appropriate fund of knowledge , normal sensation and normal coordination. Mental Status Affect - not angry, not paranoid. Cranial Nerves-Normal Bilaterally. Gait-Normal.  Neuropsychiatric Mental status exam performed with findings of-able to articulate well with normal speech/language, rate, volume and coherence, thought content normal with ability to perform basic computations and apply abstract reasoning and no evidence of hallucinations, delusions, obsessions or homicidal/suicidal ideation.  Musculoskeletal Global Assessment Spine, Ribs and Pelvis - no instability, subluxation or laxity. Right Upper Extremity - no instability, subluxation or laxity.  Lymphatic Head &  Neck  General Head & Neck Lymphatics: Bilateral - Description -  No Localized lymphadenopathy. Axillary  General Axillary Region: Bilateral - Description - No Localized lymphadenopathy. Femoral & Inguinal  Generalized Femoral & Inguinal Lymphatics: Left - Description - No Localized lymphadenopathy. Right - Description - No Localized lymphadenopathy.    Assessment & Plan  ADENOMATOUS POLYP OF ASCENDING COLON (D12.2) Story: Large ascending colon polyp with symptomatically anemia requiring admission and transfusion of blood. Stabilized. No definite tumor visit suspicious based on large size. Impression: Symptomatic anemia due to chronic blood loss from large adenomatous polyp. Possible malignancy as well.  Standard of care is segmental colonic resection. She will be a good candidate for Robotic proximal colectomy.  Operative risk for increased with her ischemic cardiomyopathy, but she normally has good exercise tolerance. Followed closely by cardiology. They feel that she is optimize and not particularly high risk at this point given her good activity level.   The anatomy & physiology of the digestive tract was discussed. The pathophysiology of the colon was discussed. Natural history risks without surgery was discussed. I feel the risks of no intervention will lead to serious problems that outweigh the operative risks; therefore, I recommended a partial colectomy to remove the pathology. Minimally invasive (Robotic/Laparoscopic) & open techniques were discussed.  Risks such as bleeding, infection, abscess, leak, reoperation, possible ostomy, hernia, heart attack, death, and other risks were discussed. I noted a good likelihood this will help address the problem. Goals of post-operative recovery were discussed as well. Need for adequate nutrition, daily bowel regimen and healthy physical activity, to optimize recovery was noted as well. We will work to minimize  complications. Educational materials were available as well. Questions were answered. The patient expresses understanding & wishes to proceed with surgery.  Pt Education - [facility] Colon Bowel Prep 2018 ERAS/Miralax/Antibiotics Started Neomycin Sulfate 500 MG Oral Tablet, 2 (two) Tablet SEE NOTE, #6, 04/28/2020, No Refill. Local Order: Pharmacist Notes: TAKE TWO TABLETS AT 2 PM, 3 PM, AND 10 PM THE DAY PRIOR TO SURGERY Started Flagyl 500 MG Oral Tablet, 2 (two) Tablet SEE NOTE, #6, 04/28/2020, No Refill. Local Order: Pharmacist Notes: Take at 2pm, 3pm, and 10pm the day prior to your colon operation Pt Education - Pamphlet Given - Laparoscopic Colorectal Surgery: discussed with patient and provided information. Pt Education - [facility] Colectomy post-op instructions: discussed with patient and provided information.  CHRONIC LOWER GI BLEEDING (K92.2)   IRON DEFICIENCY ANEMIA DUE TO CHRONIC BLOOD LOSS (D50.0)   ICD (IMPLANTABLE CARDIOVERTER-DEFIBRILLATOR) IN PLACE (Z95.810) Impression: Double check with [employer] okay to protect perioperatively   ISCHEMIC CARDIOMYOPATHY (I25.5) Impression: Some ischemic cardiomyopathy. However decent excess tolerance. Seen by cardiology and cleared with her hospitalization last week.  Quit smoking  Adin Hector, MD, FACS, MASCRS Gastrointestinal and Minimally Invasive Surgery  [name] [address] [place], [place] [address] [phone] Fax [phone] Main/Paging  CONTACT INFORMATION: Weekday (9AM-5PM) concerns: Call [facility] main office at [phone] Weeknight (5PM-9AM) or Weekend/Holiday concerns: Check [email or web address] for General Surgery [facility] coverage (Please, do not use SecureChat as it is not reliable communication to operating surgeons for immediate patient care)

## 2020-05-29 NOTE — Progress Notes (Signed)
Pt is alert, awake and oriented. Pt ambulated from stretcher to recliner. After 3 hours NT was trying to ambulate her, BP dropped. At sitting 100/57, standing 94/55, pt stated she feeling dizzy. Assisted Pt back to the recliner. Pt stated she is feeling better. Recent BP is 95/60.

## 2020-05-29 NOTE — Discharge Instructions (Signed)
SURGERY: POST OP INSTRUCTIONS °(Surgery for small bowel obstruction, colon resection, etc) ° ° °###################################################################### ° °EAT °Gradually transition to a high fiber diet with a fiber supplement over the next few days after discharge ° °WALK °Walk an hour a day.  Control your pain to do that.   ° °CONTROL PAIN °Control pain so that you can walk, sleep, tolerate sneezing/coughing, go up/down stairs. ° °HAVE A BOWEL MOVEMENT DAILY °Keep your bowels regular to avoid problems.  OK to try a laxative to override constipation.  OK to use an antidairrheal to slow down diarrhea.  Call if not better after 2 tries ° °CALL IF YOU HAVE PROBLEMS/CONCERNS °Call if you are still struggling despite following these instructions. °Call if you have concerns not answered by these instructions ° °###################################################################### ° ° °DIET °Follow a light diet the first few days at home.  Start with a bland diet such as soups, liquids, starchy foods, low fat foods, etc.  If you feel full, bloated, or constipated, stay on a ful liquid or pureed/blenderized diet for a few days until you feel better and no longer constipated. °Be sure to drink plenty of fluids every day to avoid getting dehydrated (feeling dizzy, not urinating, etc.). °Gradually add a fiber supplement to your diet over the next week.  Gradually get back to a regular solid diet.  Avoid fast food or heavy meals the first week as you are more likely to get nauseated. °It is expected for your digestive tract to need a few months to get back to normal.  It is common for your bowel movements and stools to be irregular.  You will have occasional bloating and cramping that should eventually fade away.  Until you are eating solid food normally, off all pain medications, and back to regular activities; your bowels will not be normal. °Focus on eating a low-fat, high fiber diet the rest of your life  (See Getting to Good Bowel Health, below). ° °CARE of your INCISION or WOUND °It is good for closed incision and even open wounds to be washed every day.  Shower every day.  Short baths are fine.  Wash the incisions and wounds clean with soap & water.    °If you have a closed incision(s), wash the incision with soap & water every day.  You may leave closed incisions open to air if it is dry.   You may cover the incision with clean gauze & replace it after your daily shower for comfort. °If you have skin tapes (Steristrips) or skin glue (Dermabond) on your incision, leave them in place.  They will fall off on their own like a scab.  You may trim any edges that curl up with clean scissors.  If you have staples, set up an appointment for them to be removed in the office in 10 days after surgery.  °If you have a drain, wash around the skin exit site with soap & water and place a new dressing of gauze or band aid around the skin every day.  Keep the drain site clean & dry.    °If you have an open wound with packing, see wound care instructions.  In general, it is encouraged that you remove your dressing and packing, shower with soap & water, and replace your dressing once a day.  Pack the wound with clean gauze moistened with normal (0.9%) saline to keep the wound moist & uninfected.  Pressure on the dressing for 30 minutes will stop most wound   bleeding.  Eventually your body will heal & pull the open wound closed over the next few months.  °Raw open wounds will occasionally bleed or secrete yellow drainage until it heals closed.  Drain sites will drain a little until the drain is removed.  Even closed incisions can have mild bleeding or drainage the first few days until the skin edges scab over & seal.   °If you have an open wound with a wound vac, see wound vac care instructions. ° ° ° ° °ACTIVITIES as tolerated °Start light daily activities --- self-care, walking, climbing stairs-- beginning the day after surgery.   Gradually increase activities as tolerated.  Control your pain to be active.  Stop when you are tired.  Ideally, walk several times a day, eventually an hour a day.   °Most people are back to most day-to-day activities in a few weeks.  It takes 4-8 weeks to get back to unrestricted, intense activity. °If you can walk 30 minutes without difficulty, it is safe to try more intense activity such as jogging, treadmill, bicycling, low-impact aerobics, swimming, etc. °Save the most intensive and strenuous activity for last (Usually 4-8 weeks after surgery) such as sit-ups, heavy lifting, contact sports, etc.  Refrain from any intense heavy lifting or straining until you are off narcotics for pain control.  You will have off days, but things should improve week-by-week. °DO NOT PUSH THROUGH PAIN.  Let pain be your guide: If it hurts to do something, don't do it.  Pain is your body warning you to avoid that activity for another week until the pain goes down. °You may drive when you are no longer taking narcotic prescription pain medication, you can comfortably wear a seatbelt, and you can safely make sudden turns/stops to protect yourself without hesitating due to pain. °You may have sexual intercourse when it is comfortable. If it hurts to do something, stop. ° °MEDICATIONS °Take your usually prescribed home medications unless otherwise directed.   °Blood thinners:  °Usually you can restart any strong blood thinners after the second postoperative day.  It is OK to take aspirin right away.    ° If you are on strong blood thinners (warfarin/Coumadin, Plavix, Xerelto, Eliquis, Pradaxa, etc), discuss with your surgeon, medicine PCP, and/or cardiologist for instructions on when to restart the blood thinner & if blood monitoring is needed (PT/INR blood check, etc).   ° ° °PAIN CONTROL °Pain after surgery or related to activity is often due to strain/injury to muscle, tendon, nerves and/or incisions.  This pain is usually  short-term and will improve in a few months.  °To help speed the process of healing and to get back to regular activity more quickly, DO THE FOLLOWING THINGS TOGETHER: °1. Increase activity gradually.  DO NOT PUSH THROUGH PAIN °2. Use Ice and/or Heat °3. Try Gentle Massage and/or Stretching °4. Take over the counter pain medication °5. Take Narcotic prescription pain medication for more severe pain ° °Good pain control = faster recovery.  It is better to take more medicine to be more active than to stay in bed all day to avoid medications. °1.  Increase activity gradually °Avoid heavy lifting at first, then increase to lifting as tolerated over the next 6 weeks. °Do not “push through” the pain.  Listen to your body and avoid positions and maneuvers than reproduce the pain.  Wait a few days before trying something more intense °Walking an hour a day is encouraged to help your body recover faster   and more safely.  Start slowly and stop when getting sore.  If you can walk 30 minutes without stopping or pain, you can try more intense activity (running, jogging, aerobics, cycling, swimming, treadmill, sex, sports, weightlifting, etc.) °Remember: If it hurts to do it, then don’t do it! °2. Use Ice and/or Heat °You will have swelling and bruising around the incisions.  This will take several weeks to resolve. °Ice packs or heating pads (6-8 times a day, 30-60 minutes at a time) will help sooth soreness & bruising. °Some people prefer to use ice alone, heat alone, or alternate between ice & heat.  Experiment and see what works best for you.  Consider trying ice for the first few days to help decrease swelling and bruising; then, switch to heat to help relax sore spots and speed recovery. °Shower every day.  Short baths are fine.  It feels good!  Keep the incisions and wounds clean with soap & water.   °3. Try Gentle Massage and/or Stretching °Massage at the area of pain many times a day °Stop if you feel pain - do not  overdo it °4. Take over the counter pain medication °This helps the muscle and nerve tissues become less irritable and calm down faster °Choose ONE of the following over-the-counter anti-inflammatory medications: °Acetaminophen 500mg tabs (Tylenol) 1-2 pills with every meal and just before bedtime (avoid if you have liver problems or if you have acetaminophen in you narcotic prescription) °Naproxen 220mg tabs (ex. Aleve, Naprosyn) 1-2 pills twice a day (avoid if you have kidney, stomach, IBD, or bleeding problems) °Ibuprofen 200mg tabs (ex. Advil, Motrin) 3-4 pills with every meal and just before bedtime (avoid if you have kidney, stomach, IBD, or bleeding problems) °Take with food/snack several times a day as directed for at least 2 weeks to help keep pain / soreness down & more manageable. °5. Take Narcotic prescription pain medication for more severe pain °A prescription for strong pain control is often given to you upon discharge (for example: oxycodone/Percocet, hydrocodone/Norco/Vicodin, or tramadol/Ultram) °Take your pain medication as prescribed. °Be mindful that most narcotic prescriptions contain Tylenol (acetaminophen) as well - avoid taking too much Tylenol. °If you are having problems/concerns with the prescription medicine (does not control pain, nausea, vomiting, rash, itching, etc.), please call us [phone] to see if we need to switch you to a different pain medicine that will work better for you and/or control your side effects better. °If you need a refill on your pain medication, you must call the office before 4 pm and on weekdays only.  By federal law, prescriptions for narcotics cannot be called into a pharmacy.  They must be filled out on paper & picked up from our office by the patient or authorized caretaker.  Prescriptions cannot be filled after 4 pm nor on weekends.   ° °WHEN TO CALL US [phone] °Severe uncontrolled or worsening pain  °Fever over 101 F (38.5 C) °Concerns with  the incision: Worsening pain, redness, rash/hives, swelling, bleeding, or drainage °Reactions / problems with new medications (itching, rash, hives, nausea, etc.) °Nausea and/or vomiting °Difficulty urinating °Difficulty breathing °Worsening fatigue, dizziness, lightheadedness, blurred vision °Other concerns °If you are not getting better after two weeks or are noticing you are getting worse, contact our office [phone] for further advice.  We may need to adjust your medications, re-evaluate you in the office, send you to the emergency room, or see what other things we can do to help. °The   clinic staff is available to answer your questions during regular business hours (8:30am-5pm).  Please don’t hesitate to call and ask to speak to one of our nurses for clinical concerns.    °A surgeon from [facility] is always on call at the hospitals 24 hours/day °If you have a medical emergency, go to the nearest emergency room or call 911. ° °FOLLOW UP in our office °One the day of your discharge from the hospital (or the next business weekday), please call [facility] to set up or confirm an appointment to see your surgeon in the office for a follow-up appointment.  Usually it is 2-3 weeks after your surgery.   °If you have skin staples at your incision(s), let the office know so we can set up a time in the office for the nurse to remove them (usually around 10 days after surgery). °Make sure that you call for appointments the day of discharge (or the next business weekday) from the hospital to ensure a convenient appointment time. °IF YOU HAVE DISABILITY OR FAMILY LEAVE FORMS, BRING THEM TO THE OFFICE FOR PROCESSING.  DO NOT GIVE THEM TO YOUR DOCTOR. ° °[facility] °[address], [address], [place], [place]  [address] ? °[phone] - Main °[phone] - Toll Free,  [phone] - Fax °[email or web address] ° °GETTING TO GOOD BOWEL HEALTH. °It is  expected for your digestive tract to need a few months to get back to normal.  It is common for your bowel movements and stools to be irregular.  You will have occasional bloating and cramping that should eventually fade away.  Until you are eating solid food normally, off all pain medications, and back to regular activities; your bowels will not be normal.   °Avoiding constipation °The goal: ONE SOFT BOWEL MOVEMENT A DAY!    °Drink plenty of fluids.  Choose water first. °TAKE A FIBER SUPPLEMENT EVERY DAY THE REST OF YOUR LIFE °During your first week back home, gradually add back a fiber supplement every day °Experiment which form you can tolerate.   There are many forms such as powders, tablets, wafers, gummies, etc °Psyllium bran (Metamucil), methylcellulose (Citrucel), Miralax or Glycolax, Benefiber, Flax Seed.  °Adjust the dose week-by-week (1/2 dose/day to 6 doses a day) until you are moving your bowels 1-2 times a day.  Cut back the dose or try a different fiber product if it is giving you problems such as diarrhea or bloating. °Sometimes a laxative is needed to help jump-start bowels if constipated until the fiber supplement can help regulate your bowels.  If you are tolerating eating & you are farting, it is okay to try a gentle laxative such as double dose MiraLax, prune juice, or Milk of Magnesia.  Avoid using laxatives too often. °Stool softeners can sometimes help counteract the constipating effects of narcotic pain medicines.  It can also cause diarrhea, so avoid using for too long. °If you are still constipated despite taking fiber daily, eating solids, and a few doses of laxatives, call our office. °Controlling diarrhea °Try drinking liquids and eating bland foods for a few days to avoid stressing your intestines further. °Avoid dairy products (especially milk & ice cream) for a short time.  The intestines often can lose the ability to digest lactose when stressed. °Avoid foods that cause gassiness or  bloating.  Typical foods include beans and other legumes, cabbage, broccoli, and dairy foods.  Avoid greasy, spicy, fast foods.  Every person has   some sensitivity to other foods, so listen to your body and avoid those foods that trigger problems for you. °Probiotics (such as active yogurt, Align, etc) may help repopulate the intestines and colon with normal bacteria and calm down a sensitive digestive tract °Adding a fiber supplement gradually can help thicken stools by absorbing excess fluid and retrain the intestines to act more normally.  Slowly increase the dose over a few weeks.  Too much fiber too soon can backfire and cause cramping & bloating. °It is okay to try and slow down diarrhea with a few doses of antidiarrheal medicines.   °Bismuth subsalicylate (ex. Kayopectate, Pepto Bismol) for a few doses can help control diarrhea.  Avoid if pregnant.   °Loperamide (Imodium) can slow down diarrhea.  Start with one tablet (2mg) first.  Avoid if you are having fevers or severe pain.  °ILEOSTOMY PATIENTS WILL HAVE CHRONIC DIARRHEA since their colon is not in use.    °Drink plenty of liquids.  You will need to drink even more glasses of water/liquid a day to avoid getting dehydrated. °Record output from your ileostomy.  Expect to empty the bag every 3-4 hours at first.  Most people with a permanent ileostomy empty their bag 4-6 times at the least.   °Use antidiarrheal medicine (especially Imodium) several times a day to avoid getting dehydrated.  Start with a dose at bedtime & breakfast.  Adjust up or down as needed.  Increase antidiarrheal medications as directed to avoid emptying the bag more than 8 times a day (every 3 hours). °Work with your wound ostomy nurse to learn care for your ostomy.  See ostomy care instructions. °TROUBLESHOOTING IRREGULAR BOWELS °1) Start with a soft & bland diet. No spicy, greasy, or fried foods.  °2) Avoid gluten/wheat or dairy products from diet to see if symptoms improve. °3) Miralax  17gm or flax seed mixed in 8oz. water or juice-daily. May use 2-4 times a day as needed. °4) Gas-X, Phazyme, etc. as needed for gas & bloating.  °5) Prilosec (omeprazole) over-the-counter as needed °6)  Consider probiotics (Align, Activa, etc) to help calm the bowels down ° °Call your doctor if you are getting worse or not getting better.  Sometimes further testing (cultures, endoscopy, X-ray studies, CT scans, bloodwork, etc.) may be needed to help diagnose and treat the cause of the diarrhea. °[facility] °[address], [address], [place], [place]  [address] °[phone] - Main.    °[phone]  - Toll Free.   [phone] - Fax °[email or web address] ° ° °

## 2020-05-29 NOTE — Transfer of Care (Signed)
Immediate Anesthesia Transfer of Care Note  Patient: [name] [name]  Procedure(s) Performed: ROBOTIC PROXIMAL RIGHT COLECTOMY FOR ASCENDING COLON CANCER WITH TAP BLOCK (N/A )  Patient Location: PACU  Anesthesia Type:General  Level of Consciousness: sedated  Airway & Oxygen Therapy: Patient Spontanous Breathing and Patient connected to face mask oxygen  Post-op Assessment: Report given to RN and Post -op Vital signs reviewed and stable  Post vital signs: Reviewed and stable  Last Vitals:  Vitals Value Taken Time  BP    Temp    Pulse 82 05/29/20 0945  Resp 21 05/29/20 0945  SpO2 100 % 05/29/20 0945  Vitals shown include unvalidated device data.  Last Pain:  Vitals:   05/29/20 0639  TempSrc: Oral         Complications: No complications documented.

## 2020-05-30 LAB — BASIC METABOLIC PANEL
Anion gap: 10 (ref 5–15)
BUN: 10 mg/dL (ref 8–23)
CO2: 21 mmol/L — ABNORMAL LOW (ref 22–32)
Calcium: 9 mg/dL (ref 8.9–10.3)
Chloride: 100 mmol/L (ref 98–111)
Creatinine, Ser: 1.52 mg/dL — ABNORMAL HIGH (ref 0.44–1.00)
GFR calc Af Amer: 42 mL/min — ABNORMAL LOW (ref >60)
GFR calc non Af Amer: 36 mL/min — ABNORMAL LOW (ref >60)
Glucose, Bld: 113 mg/dL — ABNORMAL HIGH (ref 70–99)
Potassium: 4.9 mmol/L (ref 3.5–5.1)
Sodium: 131 mmol/L — ABNORMAL LOW (ref 135–145)

## 2020-05-30 LAB — CBC
HCT: 28.2 % — ABNORMAL LOW (ref 36.0–46.0)
Hemoglobin: 8.4 g/dL — ABNORMAL LOW (ref 12.0–15.0)
MCH: 27.6 pg (ref 26.0–34.0)
MCHC: 29.8 g/dL — ABNORMAL LOW (ref 30.0–36.0)
MCV: 92.8 fL (ref 80.0–100.0)
Platelets: 159 10*3/uL (ref 150–400)
RBC: 3.04 MIL/uL — ABNORMAL LOW (ref 3.87–5.11)
RDW: 14.8 % (ref 11.5–15.5)
WBC: 8.9 10*3/uL (ref 4.0–10.5)
nRBC: 0 % (ref 0.0–0.2)

## 2020-05-30 LAB — MAGNESIUM: Magnesium: 1.6 mg/dL — ABNORMAL LOW (ref 1.7–2.4)

## 2020-05-30 MED ORDER — LIP MEDEX EX OINT
TOPICAL_OINTMENT | CUTANEOUS | Status: AC
  Administered 2020-05-30: 1 via TOPICAL
  Filled 2020-05-30: qty 7

## 2020-05-30 NOTE — Progress Notes (Signed)
1 Day Post-Op   Subjective/Chief Complaint: Doing well havign min pain Tol clears Ambulating    Objective: Vital signs in last 24 hours: Temp:  [97.5 F (36.4 C)-98.9 F (37.2 C)] 98.9 F (37.2 C) (09/11 0517) Pulse Rate:  [59-82] 67 (09/11 0517) Resp:  [12-21] 16 (09/11 0517) BP: (92-128)/(51-69) 110/56 (09/11 0517) SpO2:  [91 %-100 %] 91 % (09/11 0517) Weight:  [63.5 kg] 63.5 kg (09/10 1303) Last BM Date: 05/29/20  Intake/Output from previous day: 09/10 0701 - 09/11 0700 In: 3245.8 [P.O.:1035; I.V.:2010.8; IV Piggyback:200] Out: 1500 [Urine:1450; Blood:50] Intake/Output this shift: No intake/output data recorded.  PE:  Constitutional: No acute distress, conversant, appears states age. Eyes: Anicteric sclerae, moist conjunctiva, no lid lag Lungs: Clear to auscultation bilaterally, normal respiratory effort CV: regular rate and rhythm, no murmurs, no peripheral edema, pedal pulses 2+ GI: Soft, no masses or hepatosplenomegaly, non-tender to palpation, inc c/d/i Skin: No rashes, palpation reveals normal turgor Psychiatric: appropriate judgment and insight, oriented to person, place, and time   Lab Results:  Recent Labs    05/30/20 0309  WBC 8.9  HGB 8.4*  HCT 28.2*  PLT 159   BMET Recent Labs    05/30/20 0309  NA 131*  K 4.9  CL 100  CO2 21*  GLUCOSE 113*  BUN 10  CREATININE 1.52*  CALCIUM 9.0   PT/INR No results for input(s): LABPROT, INR in the last 72 hours. ABG No results for input(s): PHART, HCO3 in the last 72 hours.  Invalid input(s): PCO2, PO2  Studies/Results: No results found.  Anti-infectives: Anti-infectives (From admission, onward)   Start     Dose/Rate Route Frequency Ordered Stop   05/29/20 2000  cefoTEtan (CEFOTAN) 2 g in sodium chloride 0.9 % 100 mL IVPB        2 g 200 mL/hr over 30 Minutes Intravenous Every 12 hours 05/29/20 1311 05/29/20 2022   05/29/20 1400  neomycin (MYCIFRADIN) tablet 1,000 mg  Status:  Discontinued        "And" Linked Group Details   1,000 mg Oral 3 times per day 05/29/20 0541 05/29/20 0545   05/29/20 1400  metroNIDAZOLE (FLAGYL) tablet 1,000 mg  Status:  Discontinued       "And" Linked Group Details   1,000 mg Oral 3 times per day 05/29/20 0541 05/29/20 0545   05/29/20 0600  cefoTEtan (CEFOTAN) 2 g in sodium chloride 0.9 % 100 mL IVPB        2 g 200 mL/hr over 30 Minutes Intravenous On call to O.R. 05/29/20 0541 05/29/20 0810      Assessment/Plan: s/p Procedure(s): ROBOTIC PROXIMAL RIGHT COLECTOMY FOR ASCENDING COLON CANCER WITH TAP BLOCK (N/A) POD1 -adv to soft diet -mobilize -Pan home tomorrow if con't to do well  LOS: 1 day    Ralene Ok 05/30/2020

## 2020-05-31 NOTE — Progress Notes (Signed)
Patient given discharge instructions, and all questions were answered.  Patient was taken by wheelchair to main exit.

## 2020-06-01 LAB — SURGICAL PATHOLOGY

## 2020-06-02 NOTE — Discharge Summary (Signed)
Physician Discharge Summary    Patient ID: [name] [name] MRN: [id number] DOB/AGE: [birth year]  64 y.o.  Patient Care Team: Redmond School, MD as PCP - General (Internal Medicine) Sherren Mocha, MD as PCP - Cardiology (Cardiology) Michael Boston, MD as Consulting Physician (General Surgery) Wilford Corner, MD as Consulting Physician (Gastroenterology)  Admit date: 05/29/2020  Discharge date: 05/31/2020 Hospital Stay = 2 days    Discharge Diagnoses:  Principal Problem:   Adenomatous colon mass s/p robotic proximal colectomy 05/29/2020 Active Problems:   Hyperlipidemia with target low density lipoprotein (LDL) cholesterol less than 70 mg/dL   CAD, NATIVE VESSEL   GASTROESOPHAGEAL REFLUX DISEASE   DISC DISEASE, CERVICAL   Dual implantable cardioverter-defibrillator in situ   4 Days Post-Op  05/29/2020  POST-OPERATIVE DIAGNOSIS:   ascending colon mass  SURGERY:  05/29/2020  Procedure(s): ROBOTIC PROXIMAL RIGHT COLECTOMY FOR ASCENDING COLON CANCER WITH TAP BLOCK  SURGEON:    Surgeon(s): Michael Boston, MD Ileana Roup, MD  Consults: None  Hospital Course:   The patient underwent the surgery above for bleeding cecal polyps.  Postoperatively, the patient gradually mobilized and advanced to a solid diet.  Pain and other symptoms were treated aggressively.    By the time of discharge, the patient was walking well the hallways, eating food, having flatus.  Pain was well-controlled on an oral medications.  Based on meeting discharge criteria and continuing to recover, I felt it was safe for the patient to be discharged from the hospital to further recover with close followup. Postoperative recommendations were discussed in detail.  They are written as well.  Discharged Condition: good  Discharge Exam: Blood pressure 107/68, pulse 74, temperature 97.6 F (36.4 C), temperature source Oral, resp. rate 14, height 5\' 4"  (1.626 m), weight 68.5 kg, SpO2 96 %.  General:  Pt awake/alert/oriented x4 in No acute distress Eyes: PERRL, normal EOM.  Sclera clear.  No icterus Neuro: CN II-XII intact w/o focal sensory/motor deficits. Lymph: No head/neck/groin lymphadenopathy Psych:  No delerium/psychosis/paranoia HENT: Normocephalic, Mucus membranes moist.  No thrush Neck: Supple, No tracheal deviation Chest: No chest wall pain w good excursion CV:  Pulses intact.  Regular rhythm MS: Normal AROM mjr joints.  No obvious deformity Abdomen: Soft.  Nondistended.  Mildly tender at incisions only.  No evidence of peritonitis.  No incarcerated hernias. Ext:  SCDs BLE.  No mjr edema.  No cyanosis Skin: No petechiae / purpura   Disposition:    Follow-up Information    Michael Boston, MD In 3 weeks.   Specialty: General Surgery Contact information: [address] [place] [id number] [phone]               Discharge disposition: 01-Home or Self Care       Discharge Instructions    Call MD for:   Complete by: As directed    FEVER > 101.5 F  (temperatures < 101.5 F are not significant)   Call MD for:  extreme fatigue   Complete by: As directed    Call MD for:  persistant dizziness or light-headedness   Complete by: As directed    Call MD for:  persistant nausea and vomiting   Complete by: As directed    Call MD for:  redness, tenderness, or signs of infection (pain, swelling, redness, odor or green/yellow discharge around incision site)   Complete by: As directed    Call MD for:  severe uncontrolled pain   Complete by: As directed  Diet - low sodium heart healthy   Complete by: As directed    Start with a bland diet such as soups, liquids, starchy foods, low fat foods, etc. the first few days at home. Gradually advance to a solid, low-fat, high fiber diet by the end of the first week at home.   Add a fiber supplement to your diet (Metamucil, etc) If you feel full, bloated, or constipated, stay on a full liquid or  pureed/blenderized diet for a few days until you feel better and are no longer constipated.   Diet - low sodium heart healthy   Complete by: As directed    Discharge instructions   Complete by: As directed    See Discharge Instructions If you are not getting better after two weeks or are noticing you are getting worse, contact our office [phone] for further advice.  We may need to adjust your medications, re-evaluate you in the office, send you to the emergency room, or see what other things we can do to help. The clinic staff is available to answer your questions during regular business hours (8:30am-5pm).  Please don't hesitate to call and ask to speak to one of our nurses for clinical concerns.    A surgeon from [facility] is always on call at the hospitals 24 hours/day If you have a medical emergency, go to the nearest emergency room or call 911.   Discharge wound care:   Complete by: As directed    It is good for closed incisions and even open wounds to be washed every day.  Shower every day.  Short baths are fine.  Wash the incisions and wounds clean with soap & water.    You may leave closed incisions open to air if it is dry.   You may cover the incision with clean gauze & replace it after your daily shower for comfort.   Driving Restrictions   Complete by: As directed    You may drive when: - you are no longer taking narcotic prescription pain medication - you can comfortably wear a seatbelt - you can safely make sudden turns/stops without pain.   Increase activity slowly   Complete by: As directed    Start light daily activities --- self-care, walking, climbing stairs- beginning the day after surgery.  Gradually increase activities as tolerated.  Control your pain to be active.  Stop when you are tired.  Ideally, walk several times a day, eventually an hour a day.   Most people are back to most day-to-day activities in a few weeks.  It takes 4-6 weeks to get back  to unrestricted, intense activity. If you can walk 30 minutes without difficulty, it is safe to try more intense activity such as jogging, treadmill, bicycling, low-impact aerobics, swimming, etc. Save the most intensive and strenuous activity for last (Usually 4-8 weeks after surgery) such as sit-ups, heavy lifting, contact sports, etc.  Refrain from any intense heavy lifting or straining until you are off narcotics for pain control.  You will have off days, but things should improve week-by-week. DO NOT PUSH THROUGH PAIN.  Let pain be your guide: If it hurts to do something, don't do it.   Increase activity slowly   Complete by: As directed    Lifting restrictions   Complete by: As directed    If you can walk 30 minutes without difficulty, it is safe to try more intense activity such as jogging, treadmill, bicycling, low-impact aerobics, swimming, etc.  Save the most intensive and strenuous activity for last (Usually 4-8 weeks after surgery) such as sit-ups, heavy lifting, contact sports, etc.   Refrain from any intense heavy lifting or straining until you are off narcotics for pain control.  You will have off days, but things should improve week-by-week. DO NOT PUSH THROUGH PAIN.  Let pain be your guide: If it hurts to do something, don't do it.  Pain is your body warning you to avoid that activity for another week until the pain goes down.   May shower / Bathe   Complete by: As directed    May walk up steps   Complete by: As directed    Remove dressing in 72 hours   Complete by: As directed    Make sure all dressings are removed by the third day after surgery.  Leave incisions open to air.  OK to cover incisions with gauze or bandages as desired   Sexual Activity Restrictions   Complete by: As directed    You may have sexual intercourse when it is comfortable. If it hurts to do something, stop.      Allergies as of 05/31/2020   No Known Allergies     Medication List    TAKE these  medications   acetaminophen 325 MG tablet Commonly known as: TYLENOL Take 650 mg by mouth every 6 (six) hours as needed for mild pain or moderate pain.   DULoxetine 60 MG capsule Commonly known as: CYMBALTA Take 60 mg by mouth daily.   furosemide 20 MG tablet Commonly known as: LASIX Take one tablet by mouth once daily as needed for swelling. No more than 3 times per week. What changed:   how much to take  how to take this  when to take this  additional instructions   metoprolol tartrate 50 MG tablet Commonly known as: LOPRESSOR Take 0.5 tablets (25 mg total) by mouth 2 (two) times daily.   nitroGLYCERIN 0.4 MG SL tablet Commonly known as: NITROSTAT Place 0.4 mg under the tongue every 5 (five) minutes x 3 doses as needed for chest pain.   pantoprazole 40 MG tablet Commonly known as: Protonix Take 1 tablet (40 mg total) by mouth 2 (two) times daily.   potassium chloride SA 20 MEQ tablet Commonly known as: KLOR-CON TAKE 2 TABLETS(40 MEQ) BY MOUTH DAILY What changed: See the new instructions.   rosuvastatin 40 MG tablet Commonly known as: CRESTOR Take 1 tablet (40 mg total) by mouth daily. What changed: when to take this   traMADol 50 MG tablet Commonly known as: ULTRAM Take 1-2 tablets (50-100 mg total) by mouth every 6 (six) hours as needed for moderate pain or severe pain.            Discharge Care Instructions  (From admission, onward)         Start     Ordered   05/29/20 0000  Discharge wound care:       Comments: It is good for closed incisions and even open wounds to be washed every day.  Shower every day.  Short baths are fine.  Wash the incisions and wounds clean with soap & water.    You may leave closed incisions open to air if it is dry.   You may cover the incision with clean gauze & replace it after your daily shower for comfort.   05/29/20 0939          Significant Diagnostic Studies:  No results found  for this or any previous visit  (from the past 19 hour(s)).  CUP PACEART REMOTE DEVICE CHECK  Result Date: 05/27/2020 Scheduled remote reviewed. Normal device function.  1 NSVT 12 beats 190's Next remote 91 days. JM   Past Medical History:  Diagnosis Date  . AICD (automatic cardioverter/defibrillator) present   . Anemia   . Cervical disc disease    disabled Engineer, structural  . Coronary artery disease   . Depression   . Dyslipidemia   . Dysrhythmia   . GERD (gastroesophageal reflux disease)   . ICD (implantable cardiac defibrillator), single MDT    treated with BMS to LAD  . Ischemic cardiomyopathy    post-MI LVEF less than 30%; acute AMI Rx w BMS>>LAD  . Myocardial infarction ([place])   . Uterine cancer [facility])    age 59, s/p partial hysterectomy    Past Surgical History:  Procedure Laterality Date  . ABDOMINAL HYSTERECTOMY    . BIOPSY  04/24/2020   Procedure: BIOPSY;  Surgeon: Wilford Corner, MD;  Location: [place];  Service: Endoscopy;;  . CERVICAL Register    . COLONOSCOPY WITH PROPOFOL N/A 04/24/2020   Procedure: COLONOSCOPY WITH PROPOFOL;  Surgeon: Wilford Corner, MD;  Location: [place];  Service: Endoscopy;  Laterality: N/A;  . CORONARY ANGIOPLASTY WITH STENT PLACEMENT    . ESOPHAGOGASTRODUODENOSCOPY (EGD) WITH PROPOFOL N/A 04/24/2020   Procedure: ESOPHAGOGASTRODUODENOSCOPY (EGD) WITH PROPOFOL;  Surgeon: Wilford Corner, MD;  Location: [place];  Service: Endoscopy;  Laterality: N/A;  . HOT HEMOSTASIS N/A 04/24/2020   Procedure: HOT HEMOSTASIS (ARGON PLASMA COAGULATION/BICAP);  Surgeon: Wilford Corner, MD;  Location: [place];  Service: Endoscopy;  Laterality: N/A;  . ICD GENERATOR CHANGEOUT N/A 11/17/2017   Procedure: ICD GENERATOR CHANGEOUT;  Surgeon: Deboraha Sprang, MD;  Location: [place] CV LAB;  Service: Cardiovascular;  Laterality: N/A;  . icd implanted    . MASTECTOMY, PARTIAL Right   . PARTIAL HYSTERECTOMY    . POLYPECTOMY  04/24/2020   Procedure: POLYPECTOMY;  Surgeon:  Wilford Corner, MD;  Location: [facility] ENDOSCOPY;  Service: Endoscopy;;  . SUBMUCOSAL TATTOO INJECTION  04/24/2020   Procedure: SUBMUCOSAL TATTOO INJECTION;  Surgeon: Wilford Corner, MD;  Location: [facility] ENDOSCOPY;  Service: Endoscopy;;    Social History   Socioeconomic History  . Marital status: Divorced    Spouse name: Not on file  . Number of children: 4  . Years of education: 42  . Highest education level: Not on file  Occupational History  . Not on file  Tobacco Use  . Smoking status: Former Smoker    Packs/day: 0.50    Years: 20.00    Pack years: 10.00    Quit date: 2019    Years since quitting: 2.7  . Smokeless tobacco: Never Used  Vaping Use  . Vaping Use: Never used  Substance and Sexual Activity  . Alcohol use: Yes    Comment: rare  . Drug use: No  . Sexual activity: Not on file  Other Topics Concern  . Not on file  Social History Narrative  . Not on file   Social Determinants of Health   Financial Resource Strain:   . Difficulty of Paying Living Expenses: Not on file  Food Insecurity:   . Worried About Charity fundraiser in the Last Year: Not on file  . Ran Out of Food in the Last Year: Not on file  Transportation Needs:   . Lack of Transportation (Medical): Not on file  . Lack  of Transportation (Non-Medical): Not on file  Physical Activity:   . Days of Exercise per Week: Not on file  . Minutes of Exercise per Session: Not on file  Stress:   . Feeling of Stress : Not on file  Social Connections:   . Frequency of Communication with Friends and Family: Not on file  . Frequency of Social Gatherings with Friends and Family: Not on file  . Attends Religious Services: Not on file  . Active Member of Clubs or Organizations: Not on file  . Attends Archivist Meetings: Not on file  . Marital Status: Not on file  Intimate Partner Violence:   . Fear of Current or Ex-Partner: Not on file  . Emotionally Abused: Not on file  . Physically Abused: Not on  file  . Sexually Abused: Not on file    Family History  Problem Relation Age of Onset  . Heart attack Mother   . Cancer Neg Hx     No current facility-administered medications for this encounter.   Current Outpatient Medications  Medication Sig Dispense Refill  . acetaminophen (TYLENOL) 325 MG tablet Take 650 mg by mouth every 6 (six) hours as needed for mild pain or moderate pain.     . DULoxetine (CYMBALTA) 60 MG capsule Take 60 mg by mouth daily.   3  . furosemide (LASIX) 20 MG tablet Take one tablet by mouth once daily as needed for swelling. No more than 3 times per week. (Patient taking differently: Take 20 mg by mouth every Monday, Wednesday, and Friday. ) 12 tablet 3  . metoprolol tartrate (LOPRESSOR) 50 MG tablet Take 0.5 tablets (25 mg total) by mouth 2 (two) times daily. 90 tablet 3  . nitroGLYCERIN (NITROSTAT) 0.4 MG SL tablet Place 0.4 mg under the tongue every 5 (five) minutes x 3 doses as needed for chest pain.     . pantoprazole (PROTONIX) 40 MG tablet Take 1 tablet (40 mg total) by mouth 2 (two) times daily. 60 tablet 0  . potassium chloride SA (KLOR-CON) 20 MEQ tablet TAKE 2 TABLETS(40 MEQ) BY MOUTH DAILY (Patient taking differently: Take 40 mEq by mouth daily. ) 180 tablet 3  . rosuvastatin (CRESTOR) 40 MG tablet Take 1 tablet (40 mg total) by mouth daily. (Patient taking differently: Take 40 mg by mouth at bedtime. ) 30 tablet 11  . traMADol (ULTRAM) 50 MG tablet Take 1-2 tablets (50-100 mg total) by mouth every 6 (six) hours as needed for moderate pain or severe pain. 20 tablet 0     No Known Allergies  Signed: Morton Peters, MD, FACS, MASCRS Gastrointestinal and Minimally Invasive Surgery  [facility] [address], [place], [place] [address] [phone] Fax [phone] Main/Paging  CONTACT INFORMATION: Weekday (9AM-5PM) concerns: Call [facility] main office at [phone] Weeknight (5PM-9AM) or  Weekend/Holiday concerns: Check [email or web address] for General Surgery [facility] coverage (Please, do not use SecureChat as it is not reliable communication to operating surgeons for immediate patient care)      06/02/2020, 12:58 PM

## 2020-07-10 ENCOUNTER — Other Ambulatory Visit: Payer: Self-pay | Admitting: Cardiovascular Disease

## 2020-08-04 ENCOUNTER — Other Ambulatory Visit: Payer: Self-pay | Admitting: Cardiovascular Disease

## 2020-08-11 NOTE — Progress Notes (Signed)
Cardiology Office Note:    Date:  08/12/2020   ID:  [name] [name], DOB September 06, 1956, MRN [id number]  PCP:  Redmond School, MD  [facility] Cardiologist:  Sherren Mocha, MD   [place] Electrophysiologist:  None   Referring MD: Redmond School, MD   Chief Complaint:  Follow-up (CHF, CAD)    Patient Profile:    [name] [name] is a 64 y.o. female with:   Coronary artery disease  ? S/p anterior MI in 2010 tx with PCI to LAD  Systolic CHF ? Ischemic CM; EF 35-40  S/p ICD ? Hx of several inappropriate ICD DCs in 09/2015  SVT  Hyperlipidemia   Chronic kidney disease   GERD  Hx of GI bleed in 04/2020  admx w Hgb of 4 >> txn w PRBCs  Colo w polypoid lesion >> s/p robotic proximal colectomy 05/2020  EGD w angiodysplastic lesions tx w APC  Prior CV studies: Echocardiogram 07/31/2019 EF 60-65, normal RVSF, trivial TR - *difficult acoustic windows; images reviewed by Dr. Burt Knack; study felt to demonstrate stable moderate LV dysfunction  Echocardiogram 10/29/2012 EF 35-40, anteroseptal akinesis, inferior and inferoseptal akinesis, GR II DD, PASP 31  Cardiac catheterization 07/23/2009 LM patent with mild plaque LAD ostial 100 AV groove circumflex 30-50, distal severe stenosis; L PDA 80 RCA mid 50 EF 35 PCI: BMS to the LAD  History of Present Illness:    [name] [name] was last seen in 04/2020 after her admission for LGI bleeding.  She was found to have a large adenomatous colon polyp and partial colectomy was recommended.  She underwent robotic proximal colectomy with [name] [name] 05/29/2020.  Her post op course was unremarkable.  She returns for Cardiology f/u.  She is here alone.  She has been doing very well since last seen.  She has not had chest pain, shortness of breath, syncope, orthopnea, leg edema.        Past Medical History:  Diagnosis Date  . AICD (automatic cardioverter/defibrillator) present   . Anemia   . Cervical disc disease    disabled Engineer, structural  .  Coronary artery disease   . Depression   . Dyslipidemia   . Dysrhythmia   . GERD (gastroesophageal reflux disease)   . ICD (implantable cardiac defibrillator), single MDT    treated with BMS to LAD  . Ischemic cardiomyopathy    post-MI LVEF less than 30%; acute AMI Rx w BMS>>LAD  . Myocardial infarction ([place])   . Uterine cancer [facility])    age 64, s/p partial hysterectomy    Current Medications: Current Meds  Medication Sig  . acetaminophen (TYLENOL) 325 MG tablet Take 650 mg by mouth every 6 (six) hours as needed for mild pain or moderate pain.   . DULoxetine (CYMBALTA) 60 MG capsule Take 60 mg by mouth daily.   . furosemide (LASIX) 20 MG tablet Take one tablet by mouth once daily as needed for swelling. No more than 3 times per week. (Patient taking differently: Take 20 mg by mouth every Monday, Wednesday, and Friday. )  . metoprolol tartrate (LOPRESSOR) 50 MG tablet Take 0.5 tablets (25 mg total) by mouth 2 (two) times daily.  . nitroGLYCERIN (NITROSTAT) 0.4 MG SL tablet Place 0.4 mg under the tongue every 5 (five) minutes x 3 doses as needed for chest pain.   . pantoprazole (PROTONIX) 40 MG tablet TAKE 1 TABLET(40 MG) BY MOUTH DAILY  . potassium chloride SA (KLOR-CON) 20 MEQ tablet TAKE 2 TABLETS(40 MEQ) BY  MOUTH DAILY (Patient taking differently: Take 40 mEq by mouth daily. )  . rosuvastatin (CRESTOR) 40 MG tablet TAKE 1 TABLET(40 MG) BY MOUTH DAILY  . traMADol (ULTRAM) 50 MG tablet Take 1-2 tablets (50-100 mg total) by mouth every 6 (six) hours as needed for moderate pain or severe pain.     Allergies:   Patient has no known allergies.   Social History   Tobacco Use  . Smoking status: Former Smoker    Packs/day: 0.50    Years: 20.00    Pack years: 10.00    Quit date: 2019    Years since quitting: 2.8  . Smokeless tobacco: Never Used  Vaping Use  . Vaping Use: Never used  Substance Use Topics  . Alcohol use: Yes    Comment: rare  . Drug use: No     Family Hx: The  patient's family history includes Heart attack in her mother. There is no history of Cancer.  ROS   EKGs/Labs/Other Test Reviewed:    EKG:  EKG is not ordered today.  The ekg ordered today demonstrates n/a  Recent Labs: 04/21/2020: TSH 4.080 04/22/2020: ALT 35 05/30/2020: BUN 10; Creatinine, Ser 1.52; Hemoglobin 8.4; Magnesium 1.6; Platelets 159; Potassium 4.9; Sodium 131   Recent Lipid Panel Lab Results  Component Value Date/Time   CHOL 114 (L) 02/24/2016 10:41 AM   TRIG 146 02/24/2016 10:41 AM   HDL 35 (L) 02/24/2016 10:41 AM   CHOLHDL 3.3 02/24/2016 10:41 AM   LDLCALC 50 02/24/2016 10:41 AM   LDLDIRECT 57 12/02/2019 03:34 PM   LDLDIRECT 70.8 10/27/2010 10:20 AM      Risk Assessment/Calculations:      Physical Exam:    VS:  BP (!) 100/54   Pulse 73   Ht 5\' 4"  (1.626 m)   Wt 143 lb 9.6 oz (65.1 kg)   SpO2 95%   BMI 24.65 kg/m     Wt Readings from Last 3 Encounters:  08/12/20 143 lb 9.6 oz (65.1 kg)  05/31/20 151 lb 0.2 oz (68.5 kg)  05/26/20 140 lb (63.5 kg)     Constitutional:      Appearance: Healthy appearance. Not in distress.  Neck:     Vascular: JVD normal.  Pulmonary:     Effort: Pulmonary effort is normal.     Breath sounds: No wheezing. No rales.  Cardiovascular:     Normal rate. Regular rhythm. Normal S1. Normal S2.     Murmurs: There is no murmur.  Edema:    Peripheral edema absent.  Abdominal:     Palpations: Abdomen is soft. There is no hepatomegaly.  Skin:    General: Skin is warm and dry.  Neurological:     Mental Status: Alert and oriented to person, place and time.     Cranial Nerves: Cranial nerves are intact.       ASSESSMENT & PLAN:    1. HFrEF (heart failure with reduced ejection fraction) (HCC) EF 35-40.  Ischemic CM.  NYHA 2. Volume status stable.  She was taken off a lot her medications when she was anemic.  She has always had a low BP.  She used to be on Carvedilol and Losartan.  She is now on Metoprolol.  I think we should  try to place her back on an ACE inhibitor.  Continue current dose of Metoprolol tartrate.  Start Lisinopril 2.5 mg once daily.  I have asked her to contact me if she develops symptomatic low BP.  FU  in 6 mos.  2. Coronary artery disease involving native coronary artery of native heart without angina pectoris S/p anterior STEMI in 2010 tx with PCI to LAD.  She is doing well without angina.  She is now back on ASA.  Continue Rosuvastatin, beta-blocker.    3. Hyperlipidemia with target low density lipoprotein (LDL) cholesterol less than 70 mg/dL LDL optimal on most recent lab work.  Continue current Rx.    4. Stage 3b chronic kidney disease ([place]) Repeat BMET in 2 weeks after starting on Lisinopril.       Dispo:  Return in about 6 months (around 02/09/2021) for Routine Follow Up, w/ Dr. Burt Knack, in person.   Medication Adjustments/Labs and Tests Ordered: Current medicines are reviewed at length with the patient today.  Concerns regarding medicines are outlined above.  Tests Ordered: Orders Placed This Encounter  Procedures  . Basic metabolic panel   Medication Changes: Meds ordered this encounter  Medications  . aspirin EC 81 MG tablet    Sig: Take 1 tablet (81 mg total) by mouth daily.    Dispense:  30 tablet    Refill:  11    Order Specific Question:   Supervising Provider    Answer:   Lelon Perla [[id number]]  . lisinopril (ZESTRIL) 2.5 MG tablet    Sig: Take 1 tablet (2.5 mg total) by mouth daily.    Dispense:  90 tablet    Refill:  3    Signed, Richardson Dopp, PA-C  08/12/2020 11:18 AM    [facility] [place], [place], [place]  [address] Phone: [phone]; Fax: [phone]

## 2020-08-12 ENCOUNTER — Encounter: Payer: Self-pay | Admitting: Physician Assistant

## 2020-08-12 ENCOUNTER — Other Ambulatory Visit: Payer: Self-pay

## 2020-08-12 ENCOUNTER — Ambulatory Visit: Payer: Medicare PPO | Admitting: Physician Assistant

## 2020-08-12 VITALS — BP 100/54 | HR 73 | Ht 64.0 in | Wt 143.6 lb

## 2020-08-12 DIAGNOSIS — I2510 Atherosclerotic heart disease of native coronary artery without angina pectoris: Secondary | ICD-10-CM | POA: Diagnosis not present

## 2020-08-12 DIAGNOSIS — I5020 Unspecified systolic (congestive) heart failure: Secondary | ICD-10-CM

## 2020-08-12 DIAGNOSIS — N1832 Chronic kidney disease, stage 3b: Secondary | ICD-10-CM

## 2020-08-12 DIAGNOSIS — E785 Hyperlipidemia, unspecified: Secondary | ICD-10-CM | POA: Diagnosis not present

## 2020-08-12 MED ORDER — ASPIRIN EC 81 MG PO TBEC: 81.0000 mg | DELAYED_RELEASE_TABLET | Freq: Every day | ORAL | 11 refills | Status: AC

## 2020-08-12 MED ORDER — LISINOPRIL 2.5 MG PO TABS: 2.5000 mg | ORAL_TABLET | Freq: Every day | ORAL | 3 refills | Status: DC

## 2020-08-12 NOTE — Patient Instructions (Addendum)
Medication Instructions:  Your physician has recommended you make the following change in your medication:   1) Start Lisinopril 2.5 mg, 1 tablet by mouth once a day  *If you need a refill on your cardiac medications before your next appointment, please call your pharmacy*  Lab Work: Your physician recommends that you return for lab work in  2 weeks on 08/26/20 **The lab is open from 7:30AM-4:30PM** You may come anytime between these hours  Testing/Procedures: None ordered today  Follow-Up: At [facility], you and your health needs are our priority.  As part of our continuing mission to provide you with exceptional heart care, we have created designated Provider Care Teams.  These Care Teams include your primary Cardiologist (physician) and Advanced Practice Providers (APPs -  Physician Assistants and Nurse Practitioners) who all work together to provide you with the care you need, when you need it.  Your next appointment:   6 month(s)  The format for your next appointment:   In Person  Provider:   Sherren Mocha, MD  Other Instructions If you get weak or dizzy and your blood pressure drops on Lisinopril, stop the medication and call the office.

## 2020-08-18 ENCOUNTER — Telehealth: Payer: Self-pay | Admitting: Emergency Medicine

## 2020-08-18 NOTE — Telephone Encounter (Signed)
EGms reviewed with Dr Caryl Comes. SVT on EGMs . Resume metoprolol 25 mg BID and have patient send remote transmission in AM on 08/19/20 to assess EGM.patient given Dr Aquilla Hacker instructions and will send remote transmission in AM.

## 2020-08-18 NOTE — Telephone Encounter (Signed)
Alert received for episode of VT with all therapies exahaustedon 08/18/20 @ 0713. Patient contacted and she reports she was sleeping at time of event . Patient asymptomatic and remote transmission sent.  Current EGM shows v-rate of 134. Patient reports she stopped her metoprolol 25 mg BID on 08/12/20 after she saw [place] PA and she started lisinipril. Patient informed that her treatment plan is to take both metoprolol and lisinipril per office note. She will take metoprolol dose at this time. ED precautions given and [place] DMV driving restrictions provided.

## 2020-08-19 NOTE — Telephone Encounter (Signed)
Follow-up remote reviewed and presenting VS at 82 bpm. Patient advised to continue metoprolol 25 mg BID as ordered with other medications.

## 2020-08-26 ENCOUNTER — Ambulatory Visit (INDEPENDENT_AMBULATORY_CARE_PROVIDER_SITE_OTHER): Payer: Medicare PPO

## 2020-08-26 ENCOUNTER — Other Ambulatory Visit: Payer: Medicare PPO | Admitting: *Deleted

## 2020-08-26 ENCOUNTER — Other Ambulatory Visit: Payer: Self-pay

## 2020-08-26 ENCOUNTER — Other Ambulatory Visit: Payer: Self-pay | Admitting: Cardiovascular Disease

## 2020-08-26 DIAGNOSIS — N1832 Chronic kidney disease, stage 3b: Secondary | ICD-10-CM

## 2020-08-26 DIAGNOSIS — I255 Ischemic cardiomyopathy: Secondary | ICD-10-CM | POA: Diagnosis not present

## 2020-08-26 DIAGNOSIS — I2510 Atherosclerotic heart disease of native coronary artery without angina pectoris: Secondary | ICD-10-CM | POA: Diagnosis not present

## 2020-08-26 DIAGNOSIS — I5022 Chronic systolic (congestive) heart failure: Secondary | ICD-10-CM

## 2020-08-26 DIAGNOSIS — I5020 Unspecified systolic (congestive) heart failure: Secondary | ICD-10-CM

## 2020-08-26 DIAGNOSIS — E785 Hyperlipidemia, unspecified: Secondary | ICD-10-CM | POA: Diagnosis not present

## 2020-08-26 NOTE — Telephone Encounter (Signed)
Also advised her that she ought not drive until the problem is resolved  Also we should plan biotronik pacemaker

## 2020-08-27 LAB — CUP PACEART REMOTE DEVICE CHECK
Battery Remaining Longevity: 106 mo
Battery Voltage: 2.98 V
Brady Statistic RV Percent Paced: 0.01 %
Date Time Interrogation Session: 20211208082609
HighPow Impedance: 75 Ohm
Implantable Lead Implant Date: 20110110
Implantable Lead Location: 753860
Implantable Lead Model: 6935
Implantable Pulse Generator Implant Date: 20190301
Lead Channel Impedance Value: 437 Ohm
Lead Channel Impedance Value: 494 Ohm
Lead Channel Pacing Threshold Amplitude: 0.625 V
Lead Channel Pacing Threshold Pulse Width: 0.4 ms
Lead Channel Sensing Intrinsic Amplitude: 15.5 mV
Lead Channel Sensing Intrinsic Amplitude: 15.5 mV
Lead Channel Setting Pacing Amplitude: 2.5 V
Lead Channel Setting Pacing Pulse Width: 0.4 ms
Lead Channel Setting Sensing Sensitivity: 0.3 mV

## 2020-08-27 LAB — BASIC METABOLIC PANEL
BUN/Creatinine Ratio: 10 — ABNORMAL LOW (ref 12–28)
BUN: 15 mg/dL (ref 8–27)
CO2: 20 mmol/L (ref 20–29)
Calcium: 9.6 mg/dL (ref 8.7–10.3)
Chloride: 103 mmol/L (ref 96–106)
Creatinine, Ser: 1.53 mg/dL — ABNORMAL HIGH (ref 0.57–1.00)
GFR calc Af Amer: 42 mL/min/{1.73_m2} — ABNORMAL LOW (ref >59)
GFR calc non Af Amer: 36 mL/min/{1.73_m2} — ABNORMAL LOW (ref >59)
Glucose: 84 mg/dL (ref 65–99)
Potassium: 4.1 mmol/L (ref 3.5–5.2)
Sodium: 138 mmol/L (ref 134–144)

## 2020-08-28 NOTE — Telephone Encounter (Signed)
T he last note referring to Biotronik pacemaker is on the wrong patient.

## 2020-09-07 ENCOUNTER — Other Ambulatory Visit: Payer: Self-pay | Admitting: Cardiovascular Disease

## 2020-09-07 NOTE — Progress Notes (Signed)
Remote ICD transmission.   

## 2020-11-25 ENCOUNTER — Ambulatory Visit (INDEPENDENT_AMBULATORY_CARE_PROVIDER_SITE_OTHER): Payer: Medicare PPO

## 2020-11-25 DIAGNOSIS — I255 Ischemic cardiomyopathy: Secondary | ICD-10-CM

## 2020-11-27 LAB — CUP PACEART REMOTE DEVICE CHECK
Battery Remaining Longevity: 102 mo
Battery Voltage: 2.99 V
Brady Statistic RV Percent Paced: 0.03 %
Date Time Interrogation Session: 20220309043823
HighPow Impedance: 84 Ohm
Implantable Lead Implant Date: 20110110
Implantable Lead Location: 753860
Implantable Lead Model: 6935
Implantable Pulse Generator Implant Date: 20190301
Lead Channel Impedance Value: 475 Ohm
Lead Channel Impedance Value: 513 Ohm
Lead Channel Pacing Threshold Amplitude: 0.625 V
Lead Channel Pacing Threshold Pulse Width: 0.4 ms
Lead Channel Sensing Intrinsic Amplitude: 17.625 mV
Lead Channel Sensing Intrinsic Amplitude: 17.625 mV
Lead Channel Setting Pacing Amplitude: 2.5 V
Lead Channel Setting Pacing Pulse Width: 0.4 ms
Lead Channel Setting Sensing Sensitivity: 0.3 mV

## 2020-12-03 NOTE — Progress Notes (Signed)
Remote ICD transmission.   

## 2020-12-29 ENCOUNTER — Encounter: Payer: Medicare PPO | Admitting: Internal Medicine

## 2020-12-29 ENCOUNTER — Other Ambulatory Visit: Payer: Self-pay | Admitting: Physician Assistant

## 2021-01-04 NOTE — Progress Notes (Signed)
Cardiology Office Note Date:  01/04/2021  Patient ID:  [name] [name], [name] [name] [birth year], MRN [id number] PCP:  Redmond School, MD  Cardiologist:  Dr. Burt Knack Electrophysiologist: Dr. Caryl Comes    Chief Complaint: annual visit  History of Present Illness: ? [name] [name] is a 65 y.o. female with history of CAD (anterior MI in 2010 tx with PCI to LAD), ICM, ICD, SVT, CKD (IIIb), GERD, GIB Aug 2021 (2/2 polypoid leasion underwent partial colectomy), HLD   She comes in today to be seen for Dr. Caryl Comes, last seen by him march 2021, his not reports hx of inappropriate therapies, none appropriately, but mentions  No intercurrent Ventricular tachycardia as well. Was doing well, no changes were made.  She follows closely with Kathleen Argue, PA, saw him last Nov 2021, discussed historically BP has been limiting and planned to try and get her back on an ARB/ACE with her BB and started on lisinopril.  Planned to see Dr. Burt Knack in 37mo.   TODAY She is doing really well. Walking regularly with her dogs and at a brisk pace with good exertional capacity. Denies any CP, palpitations or cardiac awareness of any kind No SOB, no dizzy spells, near syncope or syncope   Device information MDT single chamber ICD implanted 11/17/2017 H/o inappropriate tx in 2017 for SVT  Past Medical History:  Diagnosis Date  . AICD (automatic cardioverter/defibrillator) present   . Anemia   . Cervical disc disease    disabled Engineer, structural  . Coronary artery disease   . Depression   . Dyslipidemia   . Dysrhythmia   . GERD (gastroesophageal reflux disease)   . ICD (implantable cardiac defibrillator), single MDT    treated with BMS to LAD  . Ischemic cardiomyopathy    post-MI LVEF less than 30%; acute AMI Rx w BMS>>LAD  . Myocardial infarction ([place])   . Uterine cancer [facility])    age 56, s/p partial hysterectomy    Past Surgical History:  Procedure Laterality Date  . ABDOMINAL HYSTERECTOMY    . BIOPSY  04/24/2020    Procedure: BIOPSY;  Surgeon: Wilford Corner, MD;  Location: [place];  Service: Endoscopy;;  . CERVICAL [place]    . COLONOSCOPY WITH PROPOFOL N/A 04/24/2020   Procedure: COLONOSCOPY WITH PROPOFOL;  Surgeon: Wilford Corner, MD;  Location: [place];  Service: Endoscopy;  Laterality: N/A;  . CORONARY ANGIOPLASTY WITH STENT PLACEMENT    . ESOPHAGOGASTRODUODENOSCOPY (EGD) WITH PROPOFOL N/A 04/24/2020   Procedure: ESOPHAGOGASTRODUODENOSCOPY (EGD) WITH PROPOFOL;  Surgeon: Wilford Corner, MD;  Location: [place];  Service: Endoscopy;  Laterality: N/A;  . HOT HEMOSTASIS N/A 04/24/2020   Procedure: HOT HEMOSTASIS (ARGON PLASMA COAGULATION/BICAP);  Surgeon: Wilford Corner, MD;  Location: [place];  Service: Endoscopy;  Laterality: N/A;  . ICD GENERATOR CHANGEOUT N/A 11/17/2017   Procedure: ICD GENERATOR CHANGEOUT;  Surgeon: Deboraha Sprang, MD;  Location: [place] CV LAB;  Service: Cardiovascular;  Laterality: N/A;  . icd implanted    . MASTECTOMY, PARTIAL Right   . PARTIAL HYSTERECTOMY    . POLYPECTOMY  04/24/2020   Procedure: POLYPECTOMY;  Surgeon: Wilford Corner, MD;  Location: [facility] ENDOSCOPY;  Service: Endoscopy;;  . SUBMUCOSAL TATTOO INJECTION  04/24/2020   Procedure: SUBMUCOSAL TATTOO INJECTION;  Surgeon: Wilford Corner, MD;  Location: [place];  Service: Endoscopy;;    Current Outpatient Medications  Medication Sig Dispense Refill  . acetaminophen (TYLENOL) 325 MG tablet Take 650 mg by mouth every 6 (six) hours as needed for mild  pain or moderate pain.     Marland Kitchen aspirin EC 81 MG tablet Take 1 tablet (81 mg total) by mouth daily. 30 tablet 11  . DULoxetine (CYMBALTA) 60 MG capsule Take 60 mg by mouth daily.   3  . furosemide (LASIX) 20 MG tablet TAKE 1 TABLET BY MOUTH EVERY OTHER DAY 45 tablet 2  . lisinopril (ZESTRIL) 2.5 MG tablet Take 1 tablet (2.5 mg total) by mouth daily. 90 tablet 3  . metoprolol tartrate (LOPRESSOR) 50 MG tablet Take 0.5 tablets (25 mg total)  by mouth 2 (two) times daily. 90 tablet 3  . nitroGLYCERIN (NITROSTAT) 0.4 MG SL tablet Place 0.4 mg under the tongue every 5 (five) minutes x 3 doses as needed for chest pain.     . pantoprazole (PROTONIX) 40 MG tablet TAKE 1 TABLET(40 MG) BY MOUTH DAILY 90 tablet 3  . potassium chloride SA (KLOR-CON) 20 MEQ tablet TAKE 2 TABLETS(40 MEQ) BY MOUTH DAILY 180 tablet 3  . rosuvastatin (CRESTOR) 40 MG tablet TAKE 1 TABLET(40 MG) BY MOUTH DAILY 90 tablet 3  . traMADol (ULTRAM) 50 MG tablet Take 1-2 tablets (50-100 mg total) by mouth every 6 (six) hours as needed for moderate pain or severe pain. 20 tablet 0   No current facility-administered medications for this visit.    Allergies:   Patient has no allergy information on record.   Social History:  The patient  reports that she quit smoking about 3 years ago. She has a 10.00 pack-year smoking history. She has never used smokeless tobacco. She reports current alcohol use. She reports that she does not use drugs.   Family History:  The patient's family history includes Heart attack in her mother.  ROS:  Please see the history of present illness.    All other systems are reviewed and otherwise negative.   PHYSICAL EXAM:  VS:  There were no vitals taken for this visit. BMI: There is no height or weight on file to calculate BMI. Well nourished, well developed, in no acute distress HEENT: normocephalic, atraumatic Neck: no JVD, carotid bruits or masses Cardiac:  RRR; no significant murmurs, no rubs, or gallops Lungs:  CTA b/l, no wheezing, rhonchi or rales Abd: soft, nontender MS: no deformity or atrophy Ext:  no edema Skin: warm and dry, no rash Neuro:  No gross deficits appreciated Psych: euthymic mood, full affect  ICD site is stable, no tethering or discomfort   EKG:  Not done today  Device interrogation done today and reviewed by myself:  Battery and lead measurements are OK VT episodes All available EGMs are reviewed and c/w  SVT 0%VP   Echocardiogram 07/31/2019 EF 60-65, normal RVSF, trivial TR - *difficult acoustic windows; images reviewed by Dr. Burt Knack; study felt to demonstrate stable moderate LV dysfunction  Echocardiogram 10/29/2012 EF 35-40, anteroseptal akinesis, inferior and inferoseptal akinesis, GR II DD, PASP 31  Cardiac catheterization 07/23/2009 LM patent with mild plaque LAD ostial 100 AV groove circumflex 30-50, distal severe stenosis; L PDA 80 RCA mid 50 EF 35 PCI: BMS to the LAD   Recent Labs: 04/21/2020: TSH 4.080 04/22/2020: ALT 35 05/30/2020: Hemoglobin 8.4; Magnesium 1.6; Platelets 159 08/26/2020: BUN 15; Creatinine, Ser 1.53; Potassium 4.1; Sodium 138  No results found for requested labs within last 8760 hours.   CrCl cannot be calculated (Patient's most recent lab result is older than the maximum 21 days allowed.).   Wt Readings from Last 3 Encounters:  08/12/20 143 lb 9.6 oz (65.1  kg)  05/31/20 151 lb 0.2 oz (68.5 kg)  05/26/20 140 lb (63.5 kg)     Other studies reviewed: Additional studies/records reviewed today include: summarized above  ASSESSMENT AND PLAN:  1. ICD     Intact function no programming changes made  Known SVT, she has ATP available on;y in the VT zone She has gotten ATP these back in November and seen on remotes. No BP room for additional BB She is asymptomatic  2. CAD     No anginal symptoms     On ASA, BB, statin     C/w Dr. Janyth Contes  3. ICM 4. Chronic CHF (systolic)     Recovered LVEF     No symptoms or exam findings of volume OL     On BB/ACE, diuretic     BP limits GDMT     C/w Dr. Burt Knack and team  5. SVT     As discussed above     Asymptomatic, longest episode 5 min, low burden  6. ? Hx of VT     This is unclear     None noted today   Disposition: F/u with remotes as usual and in clinic in 1 year, sooner if needed  Current medicines are reviewed at length with the patient today.  The patient did not have any concerns  regarding medicines.  Venetia Night, PA-C 01/04/2021 10:33 AM     [address] [place] [address] [phone] (office)  [phone] (fax)

## 2021-01-05 ENCOUNTER — Ambulatory Visit: Payer: Medicare PPO | Admitting: Physician Assistant

## 2021-01-05 ENCOUNTER — Encounter: Payer: Self-pay | Admitting: Physician Assistant

## 2021-01-05 ENCOUNTER — Other Ambulatory Visit: Payer: Self-pay

## 2021-01-05 VITALS — BP 98/60 | HR 50 | Ht 64.0 in | Wt 150.2 lb

## 2021-01-05 DIAGNOSIS — I471 Supraventricular tachycardia: Secondary | ICD-10-CM | POA: Diagnosis not present

## 2021-01-05 DIAGNOSIS — I5022 Chronic systolic (congestive) heart failure: Secondary | ICD-10-CM | POA: Diagnosis not present

## 2021-01-05 DIAGNOSIS — I2510 Atherosclerotic heart disease of native coronary artery without angina pectoris: Secondary | ICD-10-CM

## 2021-01-05 DIAGNOSIS — Z95810 Presence of automatic (implantable) cardiac defibrillator: Secondary | ICD-10-CM

## 2021-01-05 DIAGNOSIS — I255 Ischemic cardiomyopathy: Secondary | ICD-10-CM | POA: Diagnosis not present

## 2021-01-05 LAB — CUP PACEART INCLINIC DEVICE CHECK
Battery Remaining Longevity: 99 mo
Battery Voltage: 2.99 V
Brady Statistic RV Percent Paced: 0.01 %
Date Time Interrogation Session: 20220419162452
HighPow Impedance: 94 Ohm
Implantable Lead Implant Date: 20110110
Implantable Lead Location: 753860
Implantable Lead Model: 6935
Implantable Pulse Generator Implant Date: 20190301
Lead Channel Impedance Value: 494 Ohm
Lead Channel Impedance Value: 570 Ohm
Lead Channel Pacing Threshold Amplitude: 0.625 V
Lead Channel Pacing Threshold Pulse Width: 0.4 ms
Lead Channel Sensing Intrinsic Amplitude: 21.375 mV
Lead Channel Sensing Intrinsic Amplitude: 23.5 mV
Lead Channel Setting Pacing Amplitude: 2.5 V
Lead Channel Setting Pacing Pulse Width: 0.4 ms
Lead Channel Setting Sensing Sensitivity: 0.3 mV

## 2021-01-05 NOTE — Patient Instructions (Addendum)
Medication Instructions:  Your physician recommends that you continue on your current medications as directed. Please refer to the Current Medication list given to you today.  *If you need a refill on your cardiac medications before your next appointment, please call your pharmacy*   Lab Work: NONE ORDERED  TODAY  If you have labs (blood work) drawn today and your tests are completely normal, you will receive your results only by: . MyChart Message (if you have MyChart) OR . A paper copy in the mail If you have any lab test that is abnormal or we need to change your treatment, we will call you to review the results.   Testing/Procedures: NONE ORDERED  TODAY   Follow-Up: At [facility], you and your health needs are our priority.  As part of our continuing mission to provide you with exceptional heart care, we have created designated Provider Care Teams.  These Care Teams include your primary Cardiologist (physician) and Advanced Practice Providers (APPs -  Physician Assistants and Nurse Practitioners) who all work together to provide you with the care you need, when you need it.  We recommend signing up for the patient portal called "MyChart".  Sign up information is provided on this After Visit Summary.  MyChart is used to connect with patients for Virtual Visits (Telemedicine).  Patients are able to view lab/test results, encounter notes, upcoming appointments, etc.  Non-urgent messages can be sent to your provider as well.   To learn more about what you can do with MyChart, go to [email or web address].    Your next appointment:   1 year(s)  The format for your next appointment:   In Person  Provider:   You may see Dr. Klein  or one of the following Advanced Practice Providers on your designated Care Team:    Amber Seiler, NP  Renee Ursuy, PA-C  Michael "Andy" Tillery, PA-C    Other Instructions   

## 2021-02-11 NOTE — Progress Notes (Deleted)
Cardiology Office Note:    Date:  02/11/2021   ID:  CHRISTI WIRICK, DOB 1955/09/29, MRN [id number]  PCP:  Redmond School, [place] Providers Cardiologist:  Sherren Mocha, MD { Click to update primary MD,subspecialty MD or APP then REFRESH:1}  ***  Referring MD: Redmond School, MD   Chief Complaint:  No chief complaint on file.    Patient Profile:    DERENDA GIDDINGS is a 65 y.o. female with:   Coronary artery disease  ? S/p anterior MI in 2010 tx with PCI to LAD  Systolic CHF ? Ischemic CM; EF 35-40  S/p ICD ? Hx of several inappropriate ICD DCs in 09/2015  SVT  Hyperlipidemia   Chronic kidney disease   GERD  Hx of GI bleed in 04/2020 ? admx w Hgb of 4 >> txn w PRBCs ? Colo w polypoid lesion >> s/p robotic proximal colectomy 05/2020 ? EGD w angiodysplastic lesions tx w APC  Prior CV studies: Echocardiogram 07/31/2019 EF 60-65, normal RVSF, trivial TR - *difficult acoustic windows; images reviewed by Dr. Burt Knack; study felt to demonstrate stable moderate LV dysfunction  Echocardiogram 10/29/2012 EF 35-40, anteroseptal akinesis, inferior and inferoseptal akinesis, GR II DD, PASP 31  Cardiac catheterization 07/23/2009 LM patent with mild plaque LAD ostial 100 AV groove circumflex 30-50, distal severe stenosis; L PDA 80 RCA mid 50 EF 35 PCI: BMS to the LAD  History of Present Illness: Ms. Mezera was last seen in 11/21.  She had been taken off of most of her HF meds when she was admitted with blood loss anemia.  When I last saw her, she was doing better and I resumed low dose ACE.  She saw Tommye Standard, PA-C for EP f/u in 4/22.  She returns for f/u.   ***        Past Medical History:  Diagnosis Date  . AICD (automatic cardioverter/defibrillator) present   . Anemia   . Cervical disc disease    disabled Engineer, structural  . Coronary artery disease   . Depression   . Dyslipidemia   . Dysrhythmia   . GERD (gastroesophageal reflux disease)   . ICD  (implantable cardiac defibrillator), single MDT    treated with BMS to LAD  . Ischemic cardiomyopathy    post-MI LVEF less than 30%; acute AMI Rx w BMS>>LAD  . Myocardial infarction ([place])   . Uterine cancer [facility])    age 89, s/p partial hysterectomy    Current Medications: No outpatient medications have been marked as taking for the 02/12/21 encounter (Appointment) with Richardson Dopp T, PA-C.     Allergies:   Patient has no allergy information on record.   Social History   Tobacco Use  . Smoking status: Former Smoker    Packs/day: 0.50    Years: 20.00    Pack years: 10.00    Quit date: 2019    Years since quitting: 3.4  . Smokeless tobacco: Never Used  Vaping Use  . Vaping Use: Never used  Substance Use Topics  . Alcohol use: Yes    Comment: rare  . Drug use: No     Family Hx: The patient's family history includes Heart attack in her mother. There is no history of Cancer.  ROS   EKGs/Labs/Other Test Reviewed:    EKG:  EKG is *** ordered today.  The ekg ordered today demonstrates ***  Recent Labs: 04/21/2020: TSH 4.080 04/22/2020: ALT 35 05/30/2020: Hemoglobin 8.4; Magnesium 1.6; Platelets 159  08/26/2020: BUN 15; Creatinine, Ser 1.53; Potassium 4.1; Sodium 138   Recent Lipid Panel Lab Results  Component Value Date/Time   CHOL 114 (L) 02/24/2016 10:41 AM   TRIG 146 02/24/2016 10:41 AM   HDL 35 (L) 02/24/2016 10:41 AM   LDLCALC 50 02/24/2016 10:41 AM   LDLDIRECT 57 12/02/2019 03:34 PM   LDLDIRECT 70.8 10/27/2010 10:20 AM      Risk Assessment/Calculations:   {Does this patient have ATRIAL FIBRILLATION?[phone]}  Physical Exam:    VS:  There were no vitals taken for this visit.    Wt Readings from Last 3 Encounters:  01/05/21 150 lb 3.2 oz (68.1 kg)  08/12/20 143 lb 9.6 oz (65.1 kg)  05/31/20 151 lb 0.2 oz (68.5 kg)     Physical Exam ***     ASSESSMENT & PLAN:    *** 1. HFrEF (heart failure with reduced ejection fraction) (HCC) EF  35-40.Ischemic CM. NYHA 2. Volume status stable.  She was taken off a lot her medications when she was anemic.  She has always had a low BP.  She used to be on Carvedilol and Losartan.  She is now on Metoprolol.  I think we should try to place her back on an ACE inhibitor.  Continue current dose of Metoprolol tartrate.  Start Lisinopril 2.5 mg once daily.  I have asked her to contact me if she develops symptomatic low BP.  FU in 6 mos.  2. Coronary artery disease involving native coronary artery of native heart without angina pectoris S/p anterior STEMI in 2010 tx with PCI to LAD.  She is doing well without angina.  She is now back on ASA.  Continue Rosuvastatin, beta-blocker.    3. Hyperlipidemia with target low density lipoprotein (LDL) cholesterol less than 70 mg/dL LDL optimal on most recent lab work.  Continue current Rx.    4. Stage 3b chronic kidney disease ([place]) Repeat BMET in 2 weeks after starting on Lisinopril.      {Are you ordering a CV Procedure (e.g. stress test, cath, DCCV, TEE, etc)?   Press F2        :[id number]}    Dispo:  No follow-ups on file.   Medication Adjustments/Labs and Tests Ordered: Current medicines are reviewed at length with the patient today.  Concerns regarding medicines are outlined above.  Tests Ordered: No orders of the defined types were placed in this encounter.  Medication Changes: No orders of the defined types were placed in this encounter.   Signed, Richardson Dopp, PA-C  02/11/2021 5:25 PM    [facility] [address], [place], [place]  [address] Phone: [phone]; Fax: [phone]

## 2021-02-12 ENCOUNTER — Ambulatory Visit: Payer: Medicare PPO | Admitting: Physician Assistant

## 2021-02-12 DIAGNOSIS — N1832 Chronic kidney disease, stage 3b: Secondary | ICD-10-CM

## 2021-02-12 DIAGNOSIS — Z95810 Presence of automatic (implantable) cardiac defibrillator: Secondary | ICD-10-CM

## 2021-02-12 DIAGNOSIS — I2510 Atherosclerotic heart disease of native coronary artery without angina pectoris: Secondary | ICD-10-CM

## 2021-02-12 DIAGNOSIS — I5020 Unspecified systolic (congestive) heart failure: Secondary | ICD-10-CM

## 2021-02-24 ENCOUNTER — Ambulatory Visit (INDEPENDENT_AMBULATORY_CARE_PROVIDER_SITE_OTHER): Payer: Medicare PPO

## 2021-02-24 ENCOUNTER — Telehealth: Payer: Self-pay

## 2021-02-24 DIAGNOSIS — I472 Ventricular tachycardia, unspecified: Secondary | ICD-10-CM

## 2021-02-24 DIAGNOSIS — I255 Ischemic cardiomyopathy: Secondary | ICD-10-CM

## 2021-02-24 LAB — CUP PACEART REMOTE DEVICE CHECK
Battery Remaining Longevity: 98 mo
Battery Voltage: 2.97 V
Brady Statistic RV Percent Paced: 0.03 %
Date Time Interrogation Session: 20220608031807
HighPow Impedance: 79 Ohm
Implantable Lead Implant Date: 20110110
Implantable Lead Location: 753860
Implantable Lead Model: 6935
Implantable Pulse Generator Implant Date: 20190301
Lead Channel Impedance Value: 475 Ohm
Lead Channel Impedance Value: 551 Ohm
Lead Channel Pacing Threshold Amplitude: 0.625 V
Lead Channel Pacing Threshold Pulse Width: 0.4 ms
Lead Channel Sensing Intrinsic Amplitude: 18.125 mV
Lead Channel Sensing Intrinsic Amplitude: 18.125 mV
Lead Channel Setting Pacing Amplitude: 2.5 V
Lead Channel Setting Pacing Pulse Width: 0.4 ms
Lead Channel Setting Sensing Sensitivity: 0.3 mV

## 2021-02-24 NOTE — Telephone Encounter (Signed)
Carelink Alert received for "1- Monitored NS @ 170's 01/21/21. 1-VT occurred 01/21/21. 17:58 @  170'Sx 26 sec. ATP x 2 successful conversion to SR.  Even with a conversion EGM shows narrow complex probable SVT.Routing to triage for ATP/?SVT vs VT"  Patient with known history of inappropriate ATP for SVT. VT detection zone for ATP 167-214bpm. Max V rate during 5/5 episode 176. EGMs reviewed with A. Tillery, PA who agrees therapy was inappropriate. BMET and CBC ordered.  Successful telephone encounter to patient who states she was unaware of therapy on 5/5 at approx 6:00 pm. Patient states she was probably walking her dogs during the time of episode and wonders if her heart rate and subsequent therapy was related to exertion. She is informed of ordered labs and will present to [facility] office tomorrow for appointment. Medications are reviewed and patient verbalizes compliance with lasix, lopressor, lisinopril, and potassium dosages as prescribed. Shock plan is reviewed. Patient does not have driving restrictions related to this episode/therapy. Will forward to Dr. Caryl Comes for additional review/recommendations.

## 2021-02-25 ENCOUNTER — Other Ambulatory Visit: Payer: Medicare PPO | Admitting: *Deleted

## 2021-02-25 ENCOUNTER — Other Ambulatory Visit: Payer: Self-pay

## 2021-02-25 DIAGNOSIS — I255 Ischemic cardiomyopathy: Secondary | ICD-10-CM | POA: Diagnosis not present

## 2021-02-26 LAB — CBC
Hematocrit: 34.6 % (ref 34.0–46.6)
Hemoglobin: 10.7 g/dL — ABNORMAL LOW (ref 11.1–15.9)
MCH: 26.6 pg (ref 26.6–33.0)
MCHC: 30.9 g/dL — ABNORMAL LOW (ref 31.5–35.7)
MCV: 86 fL (ref 79–97)
Platelets: 230 10*3/uL (ref 150–450)
RBC: 4.03 x10E6/uL (ref 3.77–5.28)
RDW: 14.2 % (ref 11.7–15.4)
WBC: 6.9 10*3/uL (ref 3.4–10.8)

## 2021-02-26 LAB — BASIC METABOLIC PANEL
BUN/Creatinine Ratio: 11 — ABNORMAL LOW (ref 12–28)
BUN: 19 mg/dL (ref 8–27)
CO2: 20 mmol/L (ref 20–29)
Calcium: 9.5 mg/dL (ref 8.7–10.3)
Chloride: 101 mmol/L (ref 96–106)
Creatinine, Ser: 1.68 mg/dL — ABNORMAL HIGH (ref 0.57–1.00)
Glucose: 76 mg/dL (ref 65–99)
Potassium: 3.7 mmol/L (ref 3.5–5.2)
Sodium: 136 mmol/L (ref 134–144)
eGFR: 34 mL/min/{1.73_m2} — ABNORMAL LOW (ref >59)

## 2021-03-01 NOTE — Telephone Encounter (Signed)
Agree  would just follow  No driving restirctions

## 2021-03-16 DIAGNOSIS — Z1389 Encounter for screening for other disorder: Secondary | ICD-10-CM | POA: Diagnosis not present

## 2021-03-16 DIAGNOSIS — E782 Mixed hyperlipidemia: Secondary | ICD-10-CM | POA: Diagnosis not present

## 2021-03-16 DIAGNOSIS — E876 Hypokalemia: Secondary | ICD-10-CM | POA: Diagnosis not present

## 2021-03-16 DIAGNOSIS — Z0001 Encounter for general adult medical examination with abnormal findings: Secondary | ICD-10-CM | POA: Diagnosis not present

## 2021-03-16 DIAGNOSIS — Z6826 Body mass index (BMI) 26.0-26.9, adult: Secondary | ICD-10-CM | POA: Diagnosis not present

## 2021-03-16 DIAGNOSIS — I420 Dilated cardiomyopathy: Secondary | ICD-10-CM | POA: Diagnosis not present

## 2021-03-16 DIAGNOSIS — K219 Gastro-esophageal reflux disease without esophagitis: Secondary | ICD-10-CM | POA: Diagnosis not present

## 2021-03-16 DIAGNOSIS — Z1331 Encounter for screening for depression: Secondary | ICD-10-CM | POA: Diagnosis not present

## 2021-03-16 DIAGNOSIS — E663 Overweight: Secondary | ICD-10-CM | POA: Diagnosis not present

## 2021-03-16 DIAGNOSIS — E039 Hypothyroidism, unspecified: Secondary | ICD-10-CM | POA: Diagnosis not present

## 2021-03-16 DIAGNOSIS — N183 Chronic kidney disease, stage 3 unspecified: Secondary | ICD-10-CM | POA: Diagnosis not present

## 2021-03-18 NOTE — Progress Notes (Signed)
Remote ICD transmission.   

## 2021-03-20 ENCOUNTER — Other Ambulatory Visit: Payer: Self-pay | Admitting: Cardiovascular Disease

## 2021-05-12 ENCOUNTER — Other Ambulatory Visit: Payer: Self-pay | Admitting: Physician Assistant

## 2021-05-26 ENCOUNTER — Ambulatory Visit (INDEPENDENT_AMBULATORY_CARE_PROVIDER_SITE_OTHER): Payer: Medicare PPO

## 2021-05-26 DIAGNOSIS — I255 Ischemic cardiomyopathy: Secondary | ICD-10-CM | POA: Diagnosis not present

## 2021-05-26 LAB — CUP PACEART REMOTE DEVICE CHECK
Battery Remaining Longevity: 95 mo
Battery Voltage: 2.97 V
Brady Statistic RV Percent Paced: 0.14 %
Date Time Interrogation Session: 20220907082608
HighPow Impedance: 79 Ohm
Implantable Lead Implant Date: 20110110
Implantable Lead Location: 753860
Implantable Lead Model: 6935
Implantable Pulse Generator Implant Date: 20190301
Lead Channel Impedance Value: 475 Ohm
Lead Channel Impedance Value: 551 Ohm
Lead Channel Pacing Threshold Amplitude: 0.625 V
Lead Channel Pacing Threshold Pulse Width: 0.4 ms
Lead Channel Sensing Intrinsic Amplitude: 20.5 mV
Lead Channel Sensing Intrinsic Amplitude: 20.5 mV
Lead Channel Setting Pacing Amplitude: 2.5 V
Lead Channel Setting Pacing Pulse Width: 0.4 ms
Lead Channel Setting Sensing Sensitivity: 0.3 mV

## 2021-06-03 NOTE — Progress Notes (Signed)
Remote ICD transmission.   

## 2021-06-11 ENCOUNTER — Ambulatory Visit: Payer: Medicare PPO | Admitting: Physician Assistant

## 2021-07-18 NOTE — Progress Notes (Deleted)
Cardiology Office Note Date:  07/18/2021  Patient ID:  [name] [name], [name] [name] 1956-08-16, MRN [id number] PCP:  Redmond School, MD  Cardiologist:  Dr. Burt Knack Electrophysiologist: Dr. Caryl Comes    Chief Complaint: *** SVT  History of Present Illness: [name] [name] is a 65 y.o. female with history of CAD (anterior MI in 2010 tx with PCI to LAD), ICM, ICD, SVT, CKD (IIIb), GERD, GIB Aug 2021 (2/2 polypoid leasion underwent partial colectomy), HLD   She comes in today to be seen for Dr. Caryl Comes, last seen by him march 2021, his not reports hx of inappropriate therapies, none appropriately, but mentions  No intercurrent Ventricular tachycardia as well. Was doing well, no changes were made.  She follows closely with Kathleen Argue, PA, saw him last Nov 2021, discussed historically BP has been limiting and planned to try and get her back on an ARB/ACE with her BB and started on lisinopril.  Planned to see Dr. Burt Knack in 64mo.  I saw her April 2022 She is doing really well. Walking regularly with her dogs and at a brisk pace with good exertional capacity. Denies any CP, palpitations or cardiac awareness of any kind No SOB, no dizzy spells, near syncope or syncope She had some SVT pace terminated by her device, she was asymptomatic, no BB room for moe BB and no changes were made   She has subsequently more suspect SVTs terminated by ATP noted by device clinic, Dr. Caryl Comes aware and no changes recommended   *** symptoms *** meds, *** room on BB for more BB? *** AAD? Has CAD... no flecainide *** meds, CAD, CM *** labs, lipids   Device information MDT single chamber ICD implanted 11/17/2017 H/o inappropriate tx's  for SVT  Past Medical History:  Diagnosis Date   AICD (automatic cardioverter/defibrillator) present    Anemia    Cervical disc disease    disabled police officer   Coronary artery disease    Depression    Dyslipidemia    Dysrhythmia    GERD (gastroesophageal reflux disease)    ICD  (implantable cardiac defibrillator), single MDT    treated with BMS to LAD   Ischemic cardiomyopathy    post-MI LVEF less than 30%; acute AMI Rx w BMS>>LAD   Myocardial infarction [facility])    Uterine cancer [facility])    age 34, s/p partial hysterectomy    Past Surgical History:  Procedure Laterality Date   ABDOMINAL HYSTERECTOMY     BIOPSY  04/24/2020   Procedure: BIOPSY;  Surgeon: Wilford Corner, MD;  Location: [place];  Service: Endoscopy;;   CERVICAL DISC SURGERY     COLONOSCOPY WITH PROPOFOL N/A 04/24/2020   Procedure: COLONOSCOPY WITH PROPOFOL;  Surgeon: Wilford Corner, MD;  Location: [facility] ENDOSCOPY;  Service: Endoscopy;  Laterality: N/A;   CORONARY ANGIOPLASTY WITH STENT PLACEMENT     ESOPHAGOGASTRODUODENOSCOPY (EGD) WITH PROPOFOL N/A 04/24/2020   Procedure: ESOPHAGOGASTRODUODENOSCOPY (EGD) WITH PROPOFOL;  Surgeon: Wilford Corner, MD;  Location: [place];  Service: Endoscopy;  Laterality: N/A;   HOT HEMOSTASIS N/A 04/24/2020   Procedure: HOT HEMOSTASIS (ARGON PLASMA COAGULATION/BICAP);  Surgeon: Wilford Corner, MD;  Location: [place];  Service: Endoscopy;  Laterality: N/A;   ICD GENERATOR CHANGEOUT N/A 11/17/2017   Procedure: ICD GENERATOR CHANGEOUT;  Surgeon: Deboraha Sprang, MD;  Location: [place] CV [facility];  Service: Cardiovascular;  Laterality: N/A;   icd implanted     MASTECTOMY, PARTIAL Right    PARTIAL HYSTERECTOMY     POLYPECTOMY  04/24/2020  Procedure: POLYPECTOMY;  Surgeon: Wilford Corner, MD;  Location: [facility] ENDOSCOPY;  Service: Endoscopy;;   SUBMUCOSAL TATTOO INJECTION  04/24/2020   Procedure: SUBMUCOSAL TATTOO INJECTION;  Surgeon: Wilford Corner, MD;  Location: [place];  Service: Endoscopy;;    Current Outpatient Medications  Medication Sig Dispense Refill   acetaminophen (TYLENOL) 325 MG tablet Take 650 mg by mouth every 6 (six) hours as needed for mild pain or moderate pain.     aspirin EC 81 MG tablet Take 1 tablet (81 mg total) by mouth daily.  30 tablet 11   DULoxetine (CYMBALTA) 60 MG capsule Take 60 mg by mouth daily.   3   furosemide (LASIX) 20 MG tablet TAKE 1 TABLET BY MOUTH EVERY OTHER DAY 45 tablet 1   lisinopril (ZESTRIL) 2.5 MG tablet Take 1 tablet (2.5 mg total) by mouth daily. 90 tablet 3   metoprolol tartrate (LOPRESSOR) 50 MG tablet TAKE 1 TABLET(50 MG) BY MOUTH TWICE DAILY 180 tablet 3   nitroGLYCERIN (NITROSTAT) 0.4 MG SL tablet Place 0.4 mg under the tongue every 5 (five) minutes x 3 doses as needed for chest pain.  (Patient not taking: Reported on 02/24/2021)     pantoprazole (PROTONIX) 40 MG tablet TAKE 1 TABLET(40 MG) BY MOUTH DAILY 90 tablet 3   potassium chloride SA (KLOR-CON) 20 MEQ tablet TAKE 2 TABLETS(40 MEQ) BY MOUTH DAILY 180 tablet 3   rosuvastatin (CRESTOR) 40 MG tablet TAKE 1 TABLET(40 MG) BY MOUTH DAILY 90 tablet 3   traMADol (ULTRAM) 50 MG tablet Take 1-2 tablets (50-100 mg total) by mouth every 6 (six) hours as needed for moderate pain or severe pain. (Patient not taking: Reported on 02/24/2021) 20 tablet 0   No current facility-administered medications for this visit.    Allergies:   Patient has no allergy information on record.   Social History:  The patient  reports that she quit smoking about 3 years ago. She has a 10.00 pack-year smoking history. She has never used smokeless tobacco. She reports current alcohol use. She reports that she does not use drugs.   Family History:  The patient's family history includes Heart attack in her mother.  ROS:  Please see the history of present illness.    All other systems are reviewed and otherwise negative.   PHYSICAL EXAM:  VS:  There were no vitals taken for this visit. BMI: There is no height or weight on file to calculate BMI. Well nourished, well developed, in no acute distress HEENT: normocephalic, atraumatic Neck: no JVD, carotid bruits or masses Cardiac:  *** RRR; no significant murmurs, no rubs, or gallops Lungs:  *** CTA b/l, no wheezing,  rhonchi or rales Abd: soft, nontender MS: no deformity or atrophy Ext:  *** no edema Skin: warm and dry, no rash Neuro:  No gross deficits appreciated Psych: euthymic mood, full affect  *** ICD site is stable, no tethering or discomfort   EKG:  Not done today  Device interrogation done today and reviewed by myself:  ***   Echocardiogram 07/31/2019 EF 60-65, normal RVSF, trivial TR - *difficult acoustic windows; images reviewed by Dr. Burt Knack; study felt to demonstrate stable moderate LV dysfunction   Echocardiogram 10/29/2012 EF 35-40, anteroseptal akinesis, inferior and inferoseptal akinesis, GR II DD, PASP 31   Cardiac catheterization 07/23/2009 LM patent with mild plaque LAD ostial 100 AV groove circumflex 30-50, distal severe stenosis; L PDA 80 RCA mid 50 EF 35 PCI: BMS to the LAD   Recent Labs: 02/25/2021:  BUN 19; Creatinine, Ser 1.68; Hemoglobin 10.7; Platelets 230; Potassium 3.7; Sodium 136  No results found for requested labs within last 8760 hours.   CrCl cannot be calculated (Patient's most recent lab result is older than the maximum 21 days allowed.).   Wt Readings from Last 3 Encounters:  01/05/21 150 lb 3.2 oz (68.1 kg)  08/12/20 143 lb 9.6 oz (65.1 kg)  05/31/20 151 lb 0.2 oz (68.5 kg)     Other studies reviewed: Additional studies/records reviewed today include: summarized above  ASSESSMENT AND PLAN:  1. ICD     *** Intact function      *** no programming changes made   2. CAD     *** No anginal symptoms     *** On ASA, BB, statin     C/w Dr. Janyth Contes  3. ICM 4. Chronic CHF (systolic)     *** Recovered LVEF     *** No symptoms or exam findings of volume OL     *** On BB/ACE, diuretic     *** BP limits GDMT     C/w Dr. Burt Knack and team  5. SVT     ***As discussed above     *** Asymptomatic, ***  6. ? Hx of VT     This is unclear     *** None noted today   Disposition: ***  Current medicines are reviewed at length with the  patient today.  The patient did not have any concerns regarding medicines.  Venetia Night, PA-C 07/18/2021 9:05 AM     [facility] [address] [place] [address] [phone] (office)  [phone] (fax)

## 2021-07-20 ENCOUNTER — Encounter: Payer: Medicare PPO | Admitting: Physician Assistant

## 2021-07-23 ENCOUNTER — Other Ambulatory Visit: Payer: Self-pay | Admitting: Physician Assistant

## 2021-07-30 ENCOUNTER — Other Ambulatory Visit: Payer: Self-pay | Admitting: Cardiovascular Disease

## 2021-08-01 ENCOUNTER — Other Ambulatory Visit: Payer: Self-pay | Admitting: Cardiovascular Disease

## 2021-08-25 ENCOUNTER — Ambulatory Visit (INDEPENDENT_AMBULATORY_CARE_PROVIDER_SITE_OTHER): Payer: Medicare PPO

## 2021-08-25 DIAGNOSIS — I255 Ischemic cardiomyopathy: Secondary | ICD-10-CM | POA: Diagnosis not present

## 2021-08-25 LAB — CUP PACEART REMOTE DEVICE CHECK
Battery Remaining Longevity: 91 mo
Battery Voltage: 2.97 V
Brady Statistic RV Percent Paced: 0.69 %
Date Time Interrogation Session: 20221207073727
HighPow Impedance: 86 Ohm
Implantable Lead Implant Date: 20110110
Implantable Lead Location: 753860
Implantable Lead Model: 6935
Implantable Pulse Generator Implant Date: 20190301
Lead Channel Impedance Value: 475 Ohm
Lead Channel Impedance Value: 513 Ohm
Lead Channel Pacing Threshold Amplitude: 0.625 V
Lead Channel Pacing Threshold Pulse Width: 0.4 ms
Lead Channel Sensing Intrinsic Amplitude: 18.375 mV
Lead Channel Sensing Intrinsic Amplitude: 18.375 mV
Lead Channel Setting Pacing Amplitude: 2.5 V
Lead Channel Setting Pacing Pulse Width: 0.4 ms
Lead Channel Setting Sensing Sensitivity: 0.3 mV

## 2021-09-02 NOTE — Progress Notes (Signed)
Remote ICD transmission.   

## 2021-10-05 ENCOUNTER — Ambulatory Visit: Payer: Medicare PPO | Admitting: Physician Assistant

## 2021-10-25 ENCOUNTER — Other Ambulatory Visit: Payer: Self-pay | Admitting: Cardiovascular Disease

## 2021-10-28 ENCOUNTER — Other Ambulatory Visit: Payer: Self-pay | Admitting: Cardiovascular Disease

## 2021-10-29 ENCOUNTER — Other Ambulatory Visit: Payer: Self-pay | Admitting: Cardiovascular Disease

## 2021-11-02 ENCOUNTER — Other Ambulatory Visit: Payer: Self-pay | Admitting: Physician Assistant

## 2021-11-02 DIAGNOSIS — N1832 Chronic kidney disease, stage 3b: Secondary | ICD-10-CM | POA: Insufficient documentation

## 2021-11-02 NOTE — Progress Notes (Signed)
Cardiology Office Note:    Date:  11/03/2021   ID:  [name] [name], DOB 26-Oct-1955, MRN [id number]  PCP:  Redmond School, MD  [facility] [facility] Cardiologist:  Sherren Mocha, MD Cardiology APP:  Liliane Shi, PA-C  Electrophysiologist:  Virl Axe, MD     Referring MD: Redmond School, MD   Chief Complaint:  F/u for CAD, CHF    Patient Profile: Coronary artery disease  S/p anterior MI in 2010 tx with PCI to LAD (HFrEF) heart failure with reduced ejection fraction  Ischemic CM Echocardiogram 2/14: EF 35-40 Echocardiogram 07/2019: EF 60-65 >> poor acoustic windows / reviewed by Dr. Burt Knack and felt to demonstrate stable mod LV dysfunction  S/p ICD Hx of several inappropriate ICD DCs in 09/2015 SVT Hyperlipidemia  Chronic kidney disease  GERD Hx of GI bleed in 04/2020 admx w Hgb of 4 >> txn w PRBCs Colo w polypoid lesion >> s/p robotic proximal colectomy 05/2020 EGD w angiodysplastic lesions tx w APC   Prior CV studies: Echocardiogram 07/31/2019 EF 60-65, normal RVSF, trivial TR - *difficult acoustic windows; images reviewed by Dr. Burt Knack; study felt to demonstrate stable moderate LV dysfunction   Echocardiogram 10/29/2012 EF 35-40, anteroseptal akinesis, inferior and inferoseptal akinesis, GR II DD, PASP 31   Cardiac catheterization 07/23/2009 LM patent with mild plaque LAD ostial 100 AV groove circumflex 30-50, distal severe stenosis; L PDA 80 RCA mid 50 EF 35 PCI: BMS to the LAD   History of Present Illness:   [name] [name] is a 66 y.o. female with the above problem list.  She was last seen in 11/21.  She did see Tommye Standard, PA-C with EP in 4/22.  She returns for f/u.   She is here alone.  She is doing well without chest pain, shortness of breath, syncope, orthopnea, leg edema.  She has not had any ICD discharges.          Past Medical History:  Diagnosis Date   AICD (automatic cardioverter/defibrillator) present    Anemia    Cervical disc disease     disabled police officer   Coronary artery disease    Depression    Dyslipidemia    Dysrhythmia    GERD (gastroesophageal reflux disease)    ICD (implantable cardiac defibrillator), single MDT    treated with BMS to LAD   Ischemic cardiomyopathy    post-MI LVEF less than 30%; acute AMI Rx w BMS>>LAD   Myocardial infarction [facility])    Uterine cancer [facility])    age 17, s/p partial hysterectomy   Current Medications: Current Meds  Medication Sig   acetaminophen (TYLENOL) 325 MG tablet Take 650 mg by mouth every 6 (six) hours as needed for mild pain or moderate pain.   aspirin EC 81 MG tablet Take 1 tablet (81 mg total) by mouth daily.   DULoxetine (CYMBALTA) 60 MG capsule Take 60 mg by mouth daily.    furosemide (LASIX) 20 MG tablet TAKE 1 TABLET BY MOUTH EVERY OTHER DAY   lisinopril (ZESTRIL) 2.5 MG tablet TAKE 1 TABLET(2.5 MG) BY MOUTH DAILY   metoprolol tartrate (LOPRESSOR) 50 MG tablet TAKE 1 TABLET(50 MG) BY MOUTH TWICE DAILY   nitroGLYCERIN (NITROSTAT) 0.4 MG SL tablet Place 0.4 mg under the tongue every 5 (five) minutes x 3 doses as needed for chest pain.   pantoprazole (PROTONIX) 40 MG tablet TAKE 1 TABLET(40 MG) BY MOUTH DAILY   potassium chloride SA (KLOR-CON M) 20 MEQ tablet TAKE 2  TABLETS BY MOUTH DAILY. Please call and schedule an appt with Dr. Burt Knack for future refills [phone]. Thank you. First attempt   rosuvastatin (CRESTOR) 40 MG tablet TAKE 1 TABLET BY MOUTH DAILY. PLEASE KEEP UPCOMING APPT FOR JANUARY 2023 WITH CARDIOLOGIST BEFORE ANYMORE REFILLS   traMADol (ULTRAM) 50 MG tablet Take 1-2 tablets (50-100 mg total) by mouth every 6 (six) hours as needed for moderate pain or severe pain.    Allergies:   Patient has no allergy information on record.   Social History   Tobacco Use   Smoking status: Former    Packs/day: 0.50    Years: 20.00    Pack years: 10.00    Types: Cigarettes    Quit date: 2019    Years since quitting: 4.1   Smokeless tobacco: Never  Vaping  Use   Vaping Use: Never used  Substance Use Topics   Alcohol use: Yes    Comment: rare   Drug use: No    Family Hx: The patient's family history includes Heart attack in her mother. There is no history of Cancer.  Review of Systems  Musculoskeletal:  Positive for myalgias.    EKGs/Labs/Other Test Reviewed:    EKG:  EKG is   ordered today.  The ekg ordered today demonstrates sinus bradycardia, HR 55, normal axis, no ST-T wave changes, QTc 403  Recent Labs: 02/25/2021: BUN 19; Creatinine, Ser 1.68; Hemoglobin 10.7; Platelets 230; Potassium 3.7; Sodium 136   Recent Lipid Panel No results for input(s): CHOL, TRIG, HDL, VLDL, LDLCALC, LDLDIRECT in the last 8760 hours.   Risk Assessment/Calculations:         Physical Exam:    VS:  BP 120/64 (BP Location: Right Arm, Patient Position: Sitting, Cuff Size: Normal)    Pulse (!) 55    Ht 5' 4" (1.626 m)    Wt 151 lb 9.6 oz (68.8 kg)    SpO2 97%    BMI 26.02 kg/m     Wt Readings from Last 3 Encounters:  11/03/21 151 lb 9.6 oz (68.8 kg)  01/05/21 150 lb 3.2 oz (68.1 kg)  08/12/20 143 lb 9.6 oz (65.1 kg)    Constitutional:      Appearance: Healthy appearance. Not in distress.  Neck:     Vascular: No carotid bruit or JVR. JVD normal.  Pulmonary:     Effort: Pulmonary effort is normal.     Breath sounds: No wheezing. No rales.  Cardiovascular:     Normal rate. Regular rhythm. Normal S1. Normal S2.      Murmurs: There is no murmur.  Edema:    Peripheral edema absent.  Abdominal:     Palpations: Abdomen is soft. There is no hepatomegaly.  Skin:    General: Skin is warm and dry.  Neurological:     Mental Status: Alert and oriented to person, place and time.     Cranial Nerves: Cranial nerves are intact.        ASSESSMENT & PLAN:   CAD (coronary artery disease) Hx of anterior MI in 2010 tx with BMS to the LAD.  She is doing well without angina.  Continue ASA 81 mg once daily, metoprolol tartrate 50 mg twice daily, Rosuvastatin 40  mg once daily.  F/u in Oct 2023.  HFrEF (heart failure with reduced ejection fraction) (HCC) Ischemic CM.  EF ~ 40.  NYHA II.  Volume status stable.  Low BP has limited GDMT.  Continue Lisinopril 2.5 mg once daily, Metoprolol tartrate  50 mg twice daily.  If her BP starts to increase more, we could try adding spironolactone, change metoprolol back to carvedilol or change Lisinopril to Entresto.    SVT (supraventricular tachycardia) Continue metoprolol tartrate 50 mg twice daily.   Hyperlipidemia with target low density lipoprotein (LDL) cholesterol less than 70 mg/dL She notes myalgias.  I recommended that she hold Rosuvastatin for 2 weeks and see if it helps.  If it does, we will need to try her on a lower dose or switch to a different agent.  Obtain fasting CMET, Lipids today.  Stage 3b chronic kidney disease (HCC) Obtain CMET today.   Anemia Hx of LGI bleed.  She underwent proximal colectomy in 9/21 to remove polypoid lesion.  Last Hgb in 6/22 was 10.7.  Repeat CBC today.    Dual implantable cardioverter-defibrillator in situ F/u with EP/Dr. Caryl Comes as planned.           Dispo:  Return in about 8 months (around 07/03/2022) for Routine follow up in 8 months with Dr. Burt Knack or Richardson Dopp, PA-C..   Medication Adjustments/Labs and Tests Ordered: Current medicines are reviewed at length with the patient today.  Concerns regarding medicines are outlined above.  Tests Ordered: Orders Placed This Encounter  Procedures   Comp Met (CMET)   Lipid Profile   CBC   Medication Changes: No orders of the defined types were placed in this encounter.  Signed, Richardson Dopp, PA-C  11/03/2021 10:08 AM    [facility] [place], [place], [place]  [address] Phone: [phone]; Fax: [phone]

## 2021-11-03 ENCOUNTER — Ambulatory Visit: Payer: Medicare PPO | Admitting: Physician Assistant

## 2021-11-03 ENCOUNTER — Encounter: Payer: Self-pay | Admitting: Physician Assistant

## 2021-11-03 ENCOUNTER — Other Ambulatory Visit: Payer: Self-pay

## 2021-11-03 VITALS — BP 120/64 | HR 55 | Ht 64.0 in | Wt 151.6 lb

## 2021-11-03 DIAGNOSIS — I471 Supraventricular tachycardia: Secondary | ICD-10-CM | POA: Diagnosis not present

## 2021-11-03 DIAGNOSIS — Z95810 Presence of automatic (implantable) cardiac defibrillator: Secondary | ICD-10-CM

## 2021-11-03 DIAGNOSIS — N1832 Chronic kidney disease, stage 3b: Secondary | ICD-10-CM | POA: Diagnosis not present

## 2021-11-03 DIAGNOSIS — I2510 Atherosclerotic heart disease of native coronary artery without angina pectoris: Secondary | ICD-10-CM

## 2021-11-03 DIAGNOSIS — E785 Hyperlipidemia, unspecified: Secondary | ICD-10-CM | POA: Diagnosis not present

## 2021-11-03 DIAGNOSIS — D649 Anemia, unspecified: Secondary | ICD-10-CM | POA: Diagnosis not present

## 2021-11-03 DIAGNOSIS — I5020 Unspecified systolic (congestive) heart failure: Secondary | ICD-10-CM | POA: Diagnosis not present

## 2021-11-03 LAB — LIPID PANEL
Chol/HDL Ratio: 3.1 ratio (ref 0.0–4.4)
Cholesterol, Total: 113 mg/dL (ref 100–199)
HDL: 36 mg/dL — ABNORMAL LOW (ref >39)
LDL Chol Calc (NIH): 37 mg/dL (ref 0–99)
Triglycerides: 260 mg/dL — ABNORMAL HIGH (ref 0–149)
VLDL Cholesterol Cal: 40 mg/dL (ref 5–40)

## 2021-11-03 LAB — CBC
Hematocrit: 36.8 % (ref 34.0–46.6)
Hemoglobin: 12 g/dL (ref 11.1–15.9)
MCH: 28 pg (ref 26.6–33.0)
MCHC: 32.6 g/dL (ref 31.5–35.7)
MCV: 86 fL (ref 79–97)
Platelets: 253 10*3/uL (ref 150–450)
RBC: 4.29 x10E6/uL (ref 3.77–5.28)
RDW: 12.9 % (ref 11.7–15.4)
WBC: 9.5 10*3/uL (ref 3.4–10.8)

## 2021-11-03 LAB — COMPREHENSIVE METABOLIC PANEL
ALT: 18 IU/L (ref 0–32)
AST: 24 IU/L (ref 0–40)
Albumin/Globulin Ratio: 2.1 (ref 1.2–2.2)
Albumin: 4.9 g/dL — ABNORMAL HIGH (ref 3.8–4.8)
Alkaline Phosphatase: 170 IU/L — ABNORMAL HIGH (ref 44–121)
BUN/Creatinine Ratio: 13 (ref 12–28)
BUN: 24 mg/dL (ref 8–27)
Bilirubin Total: <0.2 mg/dL (ref 0.0–1.2)
CO2: 20 mmol/L (ref 20–29)
Calcium: 9.9 mg/dL (ref 8.7–10.3)
Chloride: 103 mmol/L (ref 96–106)
Creatinine, Ser: 1.84 mg/dL — ABNORMAL HIGH (ref 0.57–1.00)
Globulin, Total: 2.3 g/dL (ref 1.5–4.5)
Glucose: 76 mg/dL (ref 70–99)
Potassium: 4.6 mmol/L (ref 3.5–5.2)
Sodium: 136 mmol/L (ref 134–144)
Total Protein: 7.2 g/dL (ref 6.0–8.5)
eGFR: 30 mL/min/{1.73_m2} — ABNORMAL LOW (ref >59)

## 2021-11-03 NOTE — Assessment & Plan Note (Signed)
Hx of LGI bleed.  She underwent proximal colectomy in 9/21 to remove polypoid lesion.  Last Hgb in 6/22 was 10.7.  Repeat CBC today.

## 2021-11-03 NOTE — Assessment & Plan Note (Signed)
Obtain CMET today.  

## 2021-11-03 NOTE — Assessment & Plan Note (Signed)
F/u with EP/Dr. Caryl Comes as planned.

## 2021-11-03 NOTE — Assessment & Plan Note (Signed)
Hx of anterior MI in 2010 tx with BMS to the LAD.  She is doing well without angina.  Continue ASA 81 mg once daily, metoprolol tartrate 50 mg twice daily, Rosuvastatin 40 mg once daily.  F/u in Oct 2023.

## 2021-11-03 NOTE — Assessment & Plan Note (Signed)
Continue metoprolol tartrate 50 mg twice daily. 

## 2021-11-03 NOTE — Patient Instructions (Signed)
Medication Instructions:   Your physician recommends that you continue on your current medications as directed. Please refer to the Current Medication list given to you today.   *If you need a refill on your cardiac medications before your next appointment, please call your pharmacy*   Lab Work:  TODAY!!!!  CMET/LIPID/CBC  If you have labs (blood work) drawn today and your tests are completely normal, you will receive your results only by: [place] (if you have MyChart) OR A paper copy in the mail If you have any lab test that is abnormal or we need to change your treatment, we will call you to review the results.   Testing/Procedures:   None ordered.   Follow-Up: At [facility], you and your health needs are our priority.  As part of our continuing mission to provide you with exceptional heart care, we have created designated Provider Care Teams.  These Care Teams include your primary Cardiologist (physician) and Advanced Practice Providers (APPs -  Physician Assistants and Nurse Practitioners) who all work together to provide you with the care you need, when you need it.  We recommend signing up for the patient portal called "MyChart".  Sign up information is provided on this After Visit Summary.  MyChart is used to connect with patients for Virtual Visits (Telemedicine).  Patients are able to view lab/test results, encounter notes, upcoming appointments, etc.  Non-urgent messages can be sent to your provider as well.   To learn more about what you can do with MyChart, go to [email or web address].    Your next appointment:   8 month(s)  The format for your next appointment:   In Person  Provider:  Richardson Dopp, PA-C. Sherren Mocha, MD   Keep appointment with Dr.Klein.   Other Instructions   Your physician wants you to follow-up in: 8 months with Dr.Cooper or Richardson Dopp, PA-C. You will receive a reminder letter in the mail two months in advance. If you don't  receive a letter, please call our office to schedule the follow-up appointment.

## 2021-11-03 NOTE — Assessment & Plan Note (Signed)
Ischemic CM.  EF ~ 40.  NYHA II.  Volume status stable.  Low BP has limited GDMT.  Continue Lisinopril 2.5 mg once daily, Metoprolol tartrate 50 mg twice daily.  If her BP starts to increase more, we could try adding spironolactone, change metoprolol back to carvedilol or change Lisinopril to Entresto.

## 2021-11-03 NOTE — Assessment & Plan Note (Signed)
She notes myalgias.  I recommended that she hold Rosuvastatin for 2 weeks and see if it helps.  If it does, we will need to try her on a lower dose or switch to a different agent.  Obtain fasting CMET, Lipids today.

## 2021-11-04 ENCOUNTER — Telehealth: Payer: Self-pay

## 2021-11-04 DIAGNOSIS — I2510 Atherosclerotic heart disease of native coronary artery without angina pectoris: Secondary | ICD-10-CM

## 2021-11-04 MED ORDER — POTASSIUM CHLORIDE CRYS ER 20 MEQ PO TBCR: 40.0000 meq | EXTENDED_RELEASE_TABLET | ORAL | 3 refills | Status: DC

## 2021-11-04 MED ORDER — FUROSEMIDE 20 MG PO TABS: 20.0000 mg | ORAL_TABLET | ORAL | 3 refills | Status: DC

## 2021-11-04 NOTE — Telephone Encounter (Signed)
-----  Message from Scott T Weaver, PA-C sent at 11/04/2021  7:44 AM EST ----- °Creatinine increased some since 02/2021. °Alk Phos elevated. °K+, AST, ALT, Hgb normal.  LDL optimal.  Triglycerides elevated. °PLAN:  °-Decrease Lasix to 20 mg every other day °-Decrease K+ to every other day (please confirm if she is currently taking 20 mEq or 40 mEq once daily) °-Please arrange f/u with PCP for elevated alk phos °-Work on diet to reduce Triglycerides. °-Repeat CMET in 2 weeks.  °Scott Weaver, PA-C    °11/04/2021 7:37 AM   °

## 2021-11-04 NOTE — Telephone Encounter (Signed)
The patient has been notified of the result and verbalized understanding.  All questions (if any) were answered. Cora, RN 11/04/2021 4:26 PM   Updated medication list. Patient is taking potassium 40 meq. Will send results to patient's PCP. Patient will call her PCP office about the elevated alk phos. Patient will work on her diet and come in on 11/18/21 for lab work.

## 2021-11-05 NOTE — Addendum Note (Signed)
Addended by: Janan Halter F on: 11/05/2021 07:38 AM   Modules accepted: Orders

## 2021-11-08 NOTE — Addendum Note (Signed)
Addended by: Briant Cedar on: 11/08/2021 03:07 PM   Modules accepted: Orders

## 2021-11-18 ENCOUNTER — Other Ambulatory Visit: Payer: Self-pay

## 2021-11-18 ENCOUNTER — Other Ambulatory Visit: Payer: Medicare PPO

## 2021-11-18 DIAGNOSIS — I2510 Atherosclerotic heart disease of native coronary artery without angina pectoris: Secondary | ICD-10-CM | POA: Diagnosis not present

## 2021-11-18 LAB — COMPREHENSIVE METABOLIC PANEL
ALT: 20 IU/L (ref 0–32)
AST: 20 IU/L (ref 0–40)
Albumin/Globulin Ratio: 1.7 (ref 1.2–2.2)
Albumin: 4.5 g/dL (ref 3.8–4.8)
Alkaline Phosphatase: 194 IU/L — ABNORMAL HIGH (ref 44–121)
BUN/Creatinine Ratio: 8 — ABNORMAL LOW (ref 12–28)
BUN: 12 mg/dL (ref 8–27)
Bilirubin Total: <0.2 mg/dL (ref 0.0–1.2)
CO2: 19 mmol/L — ABNORMAL LOW (ref 20–29)
Calcium: 9.6 mg/dL (ref 8.7–10.3)
Chloride: 105 mmol/L (ref 96–106)
Creatinine, Ser: 1.43 mg/dL — ABNORMAL HIGH (ref 0.57–1.00)
Globulin, Total: 2.6 g/dL (ref 1.5–4.5)
Glucose: 87 mg/dL (ref 70–99)
Potassium: 4.7 mmol/L (ref 3.5–5.2)
Sodium: 137 mmol/L (ref 134–144)
Total Protein: 7.1 g/dL (ref 6.0–8.5)
eGFR: 41 mL/min/{1.73_m2} — ABNORMAL LOW (ref >59)

## 2021-11-22 ENCOUNTER — Other Ambulatory Visit: Payer: Self-pay | Admitting: Cardiovascular Disease

## 2021-11-23 ENCOUNTER — Telehealth: Payer: Self-pay | Admitting: *Deleted

## 2021-11-23 ENCOUNTER — Other Ambulatory Visit: Payer: Self-pay | Admitting: *Deleted

## 2021-11-23 DIAGNOSIS — I2510 Atherosclerotic heart disease of native coronary artery without angina pectoris: Secondary | ICD-10-CM

## 2021-11-23 DIAGNOSIS — E785 Hyperlipidemia, unspecified: Secondary | ICD-10-CM

## 2021-11-23 NOTE — Telephone Encounter (Signed)
-----   Message from Liliane Shi, [place] sent at 11/19/2021  1:17 PM EST ----- ?Please arrange fasting Lipids in 6 mos. ?Richardson Dopp, PA-C    ?11/19/2021 1:17 PM   ? ?

## 2021-11-23 NOTE — Telephone Encounter (Signed)
S/w pt is aware of recommendations. Will come in on Wednesday, Sept 13 for fasting labs, appt made, orders in and linked.  ?

## 2021-11-24 ENCOUNTER — Ambulatory Visit (INDEPENDENT_AMBULATORY_CARE_PROVIDER_SITE_OTHER): Payer: Medicare PPO

## 2021-11-24 DIAGNOSIS — I255 Ischemic cardiomyopathy: Secondary | ICD-10-CM

## 2021-11-24 LAB — CUP PACEART REMOTE DEVICE CHECK
Battery Remaining Longevity: 87 mo
Battery Voltage: 2.97 V
Brady Statistic RV Percent Paced: 0.36 %
Date Time Interrogation Session: 20230308033625
HighPow Impedance: 83 Ohm
Implantable Lead Implant Date: 20110110
Implantable Lead Location: 753860
Implantable Lead Model: 6935
Implantable Pulse Generator Implant Date: 20190301
Lead Channel Impedance Value: 475 Ohm
Lead Channel Impedance Value: 551 Ohm
Lead Channel Pacing Threshold Amplitude: 0.625 V
Lead Channel Pacing Threshold Pulse Width: 0.4 ms
Lead Channel Sensing Intrinsic Amplitude: 20.375 mV
Lead Channel Sensing Intrinsic Amplitude: 20.375 mV
Lead Channel Setting Pacing Amplitude: 2.5 V
Lead Channel Setting Pacing Pulse Width: 0.4 ms
Lead Channel Setting Sensing Sensitivity: 0.3 mV

## 2021-12-07 ENCOUNTER — Other Ambulatory Visit: Payer: Self-pay | Admitting: Physician Assistant

## 2021-12-07 NOTE — Progress Notes (Signed)
Remote ICD transmission.   

## 2021-12-31 ENCOUNTER — Encounter: Payer: Self-pay | Admitting: Internal Medicine

## 2021-12-31 ENCOUNTER — Ambulatory Visit: Payer: Medicare PPO | Admitting: Internal Medicine

## 2021-12-31 VITALS — BP 122/64 | HR 64 | Ht 64.0 in | Wt 149.6 lb

## 2021-12-31 DIAGNOSIS — I5020 Unspecified systolic (congestive) heart failure: Secondary | ICD-10-CM

## 2021-12-31 DIAGNOSIS — I471 Supraventricular tachycardia: Secondary | ICD-10-CM

## 2021-12-31 DIAGNOSIS — I255 Ischemic cardiomyopathy: Secondary | ICD-10-CM

## 2021-12-31 DIAGNOSIS — Z95810 Presence of automatic (implantable) cardiac defibrillator: Secondary | ICD-10-CM | POA: Diagnosis not present

## 2021-12-31 NOTE — Progress Notes (Signed)
? ? ? ? ?Patient Care Team: ?Redmond School, MD as PCP - General (Internal Medicine) ?Sherren Mocha, MD as PCP - Cardiology (Cardiology) ?Deboraha Sprang, MD as PCP - Electrophysiology (Cardiology) ?Michael Boston, MD as Consulting Physician (General Surgery) ?Wilford Corner, MD as Consulting Physician (Gastroenterology) ?Sharmon Revere as Physician Assistant (Cardiology) ? ? ?HPI ? ?[name] [name] is a 66 y.o. female ? seen in followup for an ICD Medtronic  implanted for primary prevention OCT 2011, gen change 3/19 . She is a 66 year-old woman s/p anterior MI 2010, who underwent primary PCI at that time. She had persisent severe LV dysfunction she underwent device generator replacement 3/19 ?The patient denies chest pain, shortness of breath, nocturnal dyspnea, orthopnea or peripheral edema.  There have been no palpitations, lightheadedness or syncope.  ? ?Interval therapy fall 2022 . Rx with ATP  ?  ?Appropriate Therapy no ?Inappropriate Therapy yes ?  ?DATE TEST EF   ?2/14 Echo   35-40 %   ?11/20 Echo   **60-65*  % Images reviewed by [facility]-- ?prob stable moderate LV dysfn  ?     ? ? ? ?Date Cr K Hgb  ?2/19 1.26 4.1 11.6  ? 3/23 1.43 4.7 12.0  ? ?  ? ? ?Past Medical History:  ?Diagnosis Date  ? AICD (automatic cardioverter/defibrillator) present   ? Anemia   ? 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 Coronary artery disease   ? Depression   ? Dyslipidemia   ? Dysrhythmia   ? GERD (gastroesophageal reflux disease)   ? ICD (implantable cardiac defibrillator), single MDT   ? treated with BMS to LAD  ? Ischemic cardiomyopathy   ? post-MI LVEF less than 30%; acute AMI Rx w BMS>>LAD  ? Myocardial infarction [facility])   ? Uterine cancer ([place])   ? age 47, s/p partial hysterectomy  ? ? ?Past Surgical History:  ?Procedure Laterality Date  ? ABDOMINAL HYSTERECTOMY    ? BIOPSY  04/24/2020  ? Procedure: BIOPSY;  Surgeon: Wilford Corner, MD;  Location: [place];  Service: Endoscopy;;  ? CERVICAL [place]    ? COLONOSCOPY WITH PROPOFOL N/A 04/24/2020  ? Procedure: COLONOSCOPY WITH PROPOFOL;  Surgeon: Wilford Corner, MD;  Location: [place];  Service: Endoscopy;  Laterality: N/A;  ? CORONARY ANGIOPLASTY WITH STENT PLACEMENT    ? ESOPHAGOGASTRODUODENOSCOPY (EGD) WITH PROPOFOL N/A 04/24/2020  ? Procedure: ESOPHAGOGASTRODUODENOSCOPY (EGD) WITH PROPOFOL;  Surgeon: Wilford Corner, MD;  Location: [place];  Service: Endoscopy;  Laterality: N/A;  ? HOT HEMOSTASIS N/A 04/24/2020  ? Procedure: HOT HEMOSTASIS (ARGON PLASMA COAGULATION/BICAP);  Surgeon: Wilford Corner, MD;  Location: [place];  Service: Endoscopy;  Laterality: N/A;  ? ICD GENERATOR CHANGEOUT N/A 11/17/2017  ? Procedure: ICD GENERATOR CHANGEOUT;  Surgeon: Deboraha Sprang, MD;  Location: [place] CV LAB;  Service: Cardiovascular;  Laterality: N/A;  ? icd implanted    ? MASTECTOMY, PARTIAL Right   ? PARTIAL HYSTERECTOMY    ? POLYPECTOMY  04/24/2020  ? Procedure: POLYPECTOMY;  Surgeon: Wilford Corner, MD;  Location: [place];  Service: Endoscopy;;  ? SUBMUCOSAL TATTOO INJECTION  04/24/2020  ? Procedure: SUBMUCOSAL TATTOO INJECTION;  Surgeon: Wilford Corner, MD;  Location: [place];  Service: Endoscopy;;  ? ? ?Current Outpatient Medications  ?Medication Sig Dispense Refill  ? acetaminophen (TYLENOL) 325 MG tablet Take 650 mg by mouth every 6 (six) hours as needed for mild pain or moderate pain.    ? aspirin  EC 81 MG tablet Take 1 tablet (81 mg total) by mouth daily. 30 tablet 11  ? DULoxetine (CYMBALTA) 60 MG capsule Take 60 mg by mouth daily.   3  ? furosemide (LASIX) 20 MG tablet Take 1 tablet (20 mg total) by mouth every other day. 45 tablet 3  ? lisinopril (ZESTRIL) 2.5 MG tablet TAKE 1 TABLET(2.5 MG) BY MOUTH DAILY 90 tablet 3  ? metoprolol tartrate (LOPRESSOR) 50 MG tablet TAKE 1 TABLET(50 MG) BY MOUTH TWICE DAILY 180 tablet 3  ? nitroGLYCERIN (NITROSTAT) 0.4 MG SL tablet Place 0.4 mg under the tongue every 5 (five) minutes x 3  doses as needed for chest pain.    ? pantoprazole (PROTONIX) 40 MG tablet TAKE 1 TABLET(40 MG) BY MOUTH DAILY 90 tablet 3  ? potassium chloride SA (KLOR-CON M) 20 MEQ tablet Take 2 tablets (40 mEq total) by mouth every other day. 30 tablet 11  ? rosuvastatin (CRESTOR) 40 MG tablet TAKE 1 TABLET BY MOUTH DAILY. PLEASE KEEP UPCOMING APPT FOR JANUARY 2023 WITH CARDIOLOGIST BEFORE ANYMORE REFILLS 15 tablet 0  ? ?No current facility-administered medications for this visit.  ? ? ?No Known Allergies ? ?Review of Systems negative except from HPI and PMH ? ?Physical Exam ?BP 122/64   Pulse 64   Ht '5\' 4"'$  (1.626 m)   Wt 149 lb 9.6 oz (67.9 kg)   SpO2 98%   BMI 25.68 kg/m?  ?Well developed and nourished in no acute distress ?HENT normal ?Neck supple with JVP-  flat   ?Clear ?Device pocket well healed; without hematoma or erythema.  There is no tethering  ?Regular rate and rhythm, no murmurs or gallops ?Abd-soft with active BS ?No Clubbing cyanosis edema ?Skin-warm and dry ?A & Oriented  Grossly normal sensory and motor function ? ?ECG sinus at 64 ? ?18/08/38 ?Otherwise normal ?Assessment and  Plan ? ?Ischemic Cardiomyopathy    ? ?CHF systolic chronic   ? ?VT ? ?SVT ? ?ICD Medtronic The patient's device was interrogated.  The information was reviewed. No changes were made in the programming.    ?  ?No symptoms of ischemia.  We will continue aspirin 81 and metoprolol 50 twice daily ? ?Continues with episodes of tachycardia.  I am not convinced, despite wavelength, that these represent ventricular tachycardia.  The morphologies are quite close to intrinsic rhythm and I wonder if they are SVT terminated with antitachycardia pacing.  Continue the metoprolol. ? ?For her cardiomyopathy we will continue the lisinopril 2.5 and with interval recovery, we will not add Aldactone.  Her symptoms are also minimal so I will defer the addition of an SGLT2 ? ?Euvolemic.  Continue furosemide 20 qod ? ?  ?  ?  ?  ? ? ? ?  ?

## 2021-12-31 NOTE — Patient Instructions (Signed)
Medication Instructions:  Your physician recommends that you continue on your current medications as directed. Please refer to the Current Medication list given to you today.  *If you need a refill on your cardiac medications before your next appointment, please call your pharmacy*   Lab Work: None ordered.  If you have labs (blood work) drawn today and your tests are completely normal, you will receive your results only by: MyChart Message (if you have MyChart) OR A paper copy in the mail If you have any lab test that is abnormal or we need to change your treatment, we will call you to review the results.   Testing/Procedures: None ordered.    Follow-Up: At [facility], you and your health needs are our priority.  As part of our continuing mission to provide you with exceptional heart care, we have created designated Provider Care Teams.  These Care Teams include your primary Cardiologist (physician) and Advanced Practice Providers (APPs -  Physician Assistants and Nurse Practitioners) who all work together to provide you with the care you need, when you need it.  We recommend signing up for the patient portal called "MyChart".  Sign up information is provided on this After Visit Summary.  MyChart is used to connect with patients for Virtual Visits (Telemedicine).  Patients are able to view lab/test results, encounter notes, upcoming appointments, etc.  Non-urgent messages can be sent to your provider as well.   To learn more about what you can do with MyChart, go to [email or web address].    Your next appointment:   12 months with Dr Klein's PA  Important Information About Sugar       

## 2022-01-12 DIAGNOSIS — N183 Chronic kidney disease, stage 3 unspecified: Secondary | ICD-10-CM | POA: Diagnosis not present

## 2022-01-12 DIAGNOSIS — K219 Gastro-esophageal reflux disease without esophagitis: Secondary | ICD-10-CM | POA: Diagnosis not present

## 2022-01-24 ENCOUNTER — Other Ambulatory Visit: Payer: Self-pay | Admitting: Cardiovascular Disease

## 2022-02-23 ENCOUNTER — Ambulatory Visit (INDEPENDENT_AMBULATORY_CARE_PROVIDER_SITE_OTHER): Payer: Medicare PPO

## 2022-02-23 DIAGNOSIS — I255 Ischemic cardiomyopathy: Secondary | ICD-10-CM | POA: Diagnosis not present

## 2022-02-24 LAB — CUP PACEART REMOTE DEVICE CHECK
Battery Remaining Longevity: 84 mo
Battery Voltage: 2.97 V
Brady Statistic RV Percent Paced: 0.12 %
Date Time Interrogation Session: 20230607044225
HighPow Impedance: 74 Ohm
Implantable Lead Implant Date: 20110110
Implantable Lead Location: 753860
Implantable Lead Model: 6935
Implantable Pulse Generator Implant Date: 20190301
Lead Channel Impedance Value: 475 Ohm
Lead Channel Impedance Value: 513 Ohm
Lead Channel Pacing Threshold Amplitude: 0.625 V
Lead Channel Pacing Threshold Pulse Width: 0.4 ms
Lead Channel Sensing Intrinsic Amplitude: 19 mV
Lead Channel Sensing Intrinsic Amplitude: 19 mV
Lead Channel Setting Pacing Amplitude: 2.5 V
Lead Channel Setting Pacing Pulse Width: 0.4 ms
Lead Channel Setting Sensing Sensitivity: 0.3 mV

## 2022-03-03 ENCOUNTER — Other Ambulatory Visit: Payer: Self-pay | Admitting: Cardiovascular Disease

## 2022-03-04 NOTE — Progress Notes (Signed)
Remote ICD transmission.   

## 2022-03-18 ENCOUNTER — Other Ambulatory Visit: Payer: Self-pay

## 2022-03-18 MED ORDER — ROSUVASTATIN CALCIUM 40 MG PO TABS: ORAL_TABLET | ORAL | 9 refills | Status: DC

## 2022-04-13 DIAGNOSIS — Z0001 Encounter for general adult medical examination with abnormal findings: Secondary | ICD-10-CM | POA: Diagnosis not present

## 2022-04-13 DIAGNOSIS — E039 Hypothyroidism, unspecified: Secondary | ICD-10-CM | POA: Diagnosis not present

## 2022-04-13 DIAGNOSIS — Z1331 Encounter for screening for depression: Secondary | ICD-10-CM | POA: Diagnosis not present

## 2022-04-13 DIAGNOSIS — I5022 Chronic systolic (congestive) heart failure: Secondary | ICD-10-CM | POA: Diagnosis not present

## 2022-04-13 DIAGNOSIS — D518 Other vitamin B12 deficiency anemias: Secondary | ICD-10-CM | POA: Diagnosis not present

## 2022-04-13 DIAGNOSIS — I420 Dilated cardiomyopathy: Secondary | ICD-10-CM | POA: Diagnosis not present

## 2022-04-13 DIAGNOSIS — N183 Chronic kidney disease, stage 3 unspecified: Secondary | ICD-10-CM | POA: Diagnosis not present

## 2022-04-13 DIAGNOSIS — E559 Vitamin D deficiency, unspecified: Secondary | ICD-10-CM | POA: Diagnosis not present

## 2022-04-13 DIAGNOSIS — Z6826 Body mass index (BMI) 26.0-26.9, adult: Secondary | ICD-10-CM | POA: Diagnosis not present

## 2022-05-25 ENCOUNTER — Ambulatory Visit (INDEPENDENT_AMBULATORY_CARE_PROVIDER_SITE_OTHER): Payer: Medicare PPO

## 2022-05-25 DIAGNOSIS — I255 Ischemic cardiomyopathy: Secondary | ICD-10-CM

## 2022-05-25 LAB — CUP PACEART REMOTE DEVICE CHECK
Battery Remaining Longevity: 80 mo
Battery Voltage: 2.96 V
Brady Statistic RV Percent Paced: 0.05 %
Date Time Interrogation Session: 20230906042505
HighPow Impedance: 83 Ohm
Implantable Lead Implant Date: 20110110
Implantable Lead Location: 753860
Implantable Lead Model: 6935
Implantable Pulse Generator Implant Date: 20190301
Lead Channel Impedance Value: 513 Ohm
Lead Channel Impedance Value: 627 Ohm
Lead Channel Pacing Threshold Amplitude: 0.625 V
Lead Channel Pacing Threshold Pulse Width: 0.4 ms
Lead Channel Sensing Intrinsic Amplitude: 19.625 mV
Lead Channel Sensing Intrinsic Amplitude: 19.625 mV
Lead Channel Setting Pacing Amplitude: 2.5 V
Lead Channel Setting Pacing Pulse Width: 0.4 ms
Lead Channel Setting Sensing Sensitivity: 0.3 mV

## 2022-06-01 ENCOUNTER — Ambulatory Visit: Payer: Medicare PPO | Attending: Physician Assistant

## 2022-06-01 DIAGNOSIS — E785 Hyperlipidemia, unspecified: Secondary | ICD-10-CM

## 2022-06-01 DIAGNOSIS — I2510 Atherosclerotic heart disease of native coronary artery without angina pectoris: Secondary | ICD-10-CM | POA: Diagnosis not present

## 2022-06-01 LAB — LIPID PANEL
Chol/HDL Ratio: 3.6 ratio (ref 0.0–4.4)
Cholesterol, Total: 114 mg/dL (ref 100–199)
HDL: 32 mg/dL — ABNORMAL LOW (ref >39)
LDL Chol Calc (NIH): 48 mg/dL (ref 0–99)
Triglycerides: 212 mg/dL — ABNORMAL HIGH (ref 0–149)
VLDL Cholesterol Cal: 34 mg/dL (ref 5–40)

## 2022-06-02 ENCOUNTER — Telehealth: Payer: Self-pay

## 2022-06-02 DIAGNOSIS — E785 Hyperlipidemia, unspecified: Secondary | ICD-10-CM

## 2022-06-02 DIAGNOSIS — I2510 Atherosclerotic heart disease of native coronary artery without angina pectoris: Secondary | ICD-10-CM

## 2022-06-02 MED ORDER — ICOSAPENT ETHYL 1 G PO CAPS: 2.0000 g | ORAL_CAPSULE | Freq: Two times a day (BID) | ORAL | 3 refills | Status: DC

## 2022-06-02 NOTE — Telephone Encounter (Signed)
-----   Message from Liliane Shi, PA-C sent at 06/01/2022 10:40 PM EDT ----- LDL optimal. Triglycerides elevated. PLAN: -Start Vascepa 2 grams twice daily  -Lipids, LFTs in 3 mos Richardson Dopp, [place]    06/01/2022 10:30 PM

## 2022-06-02 NOTE — Telephone Encounter (Signed)
The patient has been notified of the result and verbalized understanding.  All questions (if any) were answered. Precious Gilding, RN 06/02/2022 11:19 AM   F/U FLP, LFT scheduled for 09/02/22.

## 2022-06-13 NOTE — Progress Notes (Signed)
Remote ICD transmission.   

## 2022-07-04 ENCOUNTER — Other Ambulatory Visit: Payer: Self-pay | Admitting: Cardiovascular Disease

## 2022-08-21 ENCOUNTER — Other Ambulatory Visit: Payer: Self-pay | Admitting: Cardiovascular Disease

## 2022-08-24 ENCOUNTER — Ambulatory Visit (INDEPENDENT_AMBULATORY_CARE_PROVIDER_SITE_OTHER): Payer: Medicare PPO

## 2022-08-24 DIAGNOSIS — I255 Ischemic cardiomyopathy: Secondary | ICD-10-CM | POA: Diagnosis not present

## 2022-08-24 LAB — CUP PACEART REMOTE DEVICE CHECK
Battery Remaining Longevity: 77 mo
Battery Voltage: 2.95 V
Brady Statistic RV Percent Paced: 0.08 %
Date Time Interrogation Session: 20231206022826
HighPow Impedance: 82 Ohm
Implantable Lead Connection Status: 753985
Implantable Lead Implant Date: 20110110
Implantable Lead Location: 753860
Implantable Lead Model: 6935
Implantable Pulse Generator Implant Date: 20190301
Lead Channel Impedance Value: 494 Ohm
Lead Channel Impedance Value: 551 Ohm
Lead Channel Pacing Threshold Amplitude: 0.625 V
Lead Channel Pacing Threshold Pulse Width: 0.4 ms
Lead Channel Sensing Intrinsic Amplitude: 17.5 mV
Lead Channel Sensing Intrinsic Amplitude: 17.5 mV
Lead Channel Setting Pacing Amplitude: 2.5 V
Lead Channel Setting Pacing Pulse Width: 0.4 ms
Lead Channel Setting Sensing Sensitivity: 0.3 mV
Zone Setting Status: 755011

## 2022-08-25 NOTE — Progress Notes (Signed)
Cardiology Office Note:    Date:  08/26/2022   ID:  [name] [name], DOB Dec 13, 1955, MRN [id number]  PCP:  Redmond School, [place] Prov[facility] Cardiologist:  Sherren Mocha, MD Cardiology APP:  Liliane Shi, PA-C  Electrophysiologist:  Virl Axe, MD    Referring MD: Redmond School, MD   Chief Complaint:  F/u for CAD, CHF    Patient Profile: Coronary artery disease  S/p anterior MI in 2010 tx with PCI (BMS) to LAD [facility] 07/23/09: oLAD 100, LCx 30-50 w severe dist dz, L PDA 32, mRCA 16, EF 35 (HFrEF) heart failure with reduced ejection fraction  Ischemic CM TTE 2/14: EF 35-40 TTE 07/31/2019: EF 60-65, normal RVSF, trivial TR - *difficult acoustic windows >> reviewed by Dr. Burt Knack and felt to demonstrate stable mod LV dysfunction  S/p ICD Hx of several inappropriate ICD DCs in 09/2015 SVT Hyperlipidemia  Chronic kidney disease  GERD Hx of GI bleed in 04/2020 admx w Hgb of 4 >> txn w PRBCs Colo w polypoid lesion >> s/p robotic proximal colectomy 05/2020 EGD w angiodysplastic lesions tx w APC   History of Present Illness:   [name] [name] is a 66 y.o. female with the above problem list.  She was last seen 11/03/2021.  She returns for follow-up. She is here alone. She is doing well w/o chest pain, syncope, shortness of breath, orthopnea, leg edema.        EKG:  not done    Reviewed and updated this encounter:  Tobacco  Allergies  Meds  Problems  Med Hx  Surg Hx  Fam Hx      Review of Systems  Cardiovascular:  Negative for claudication.     Labs/Other Test Reviewed:    Recent Labs: 11/03/2021: Hemoglobin 12.0; Platelets 253 11/18/2021: ALT 20; BUN 12; Creatinine, Ser 1.43; Potassium 4.7; Sodium 137   Recent Lipid Panel Recent Labs    06/01/22 0919  CHOL 114  TRIG 212*  HDL 32*  LDLCALC 48      Risk Assessment/Calculations/Metrics:              Physical Exam:    VS:  BP 117/60   Pulse 62   Ht '5\' 4"'$  (1.626 m)   Wt 143 lb 12.8 oz (65.2  kg)   SpO2 98%   BMI 24.68 kg/m     Wt Readings from Last 3 Encounters:  08/26/22 143 lb 12.8 oz (65.2 kg)  12/31/21 149 lb 9.6 oz (67.9 kg)  11/03/21 151 lb 9.6 oz (68.8 kg)    Constitutional:      Appearance: Healthy appearance. Not in distress.  Neck:     Vascular: JVD normal.  Pulmonary:     Effort: Pulmonary effort is normal.     Breath sounds: No wheezing. No rales.  Cardiovascular:     Normal rate. Regular rhythm. Normal S1. Normal S2.      Murmurs: There is no murmur.  Pulses:    Intact distal pulses.  Edema:    Peripheral edema absent.  Abdominal:     Palpations: There is no hepatomegaly.         ASSESSMENT & PLAN:   CAD (coronary artery disease) Hx of anterior MI in 2010 tx with BMS to the LAD. She is stable on her current regimen without angina. Continue ASA 81 mg once daily, Crestor 40 mg once daily, Lopressor 50 mg twice daily. F/u in 6 mos.   HFrEF (heart failure with reduced  ejection fraction) (HCC) Ischemic CM.  EF ~ 40.  NYHA II.  Volume status stable.  Low BP has limited GDMT.  Continue Lisinopril 2.5 mg once daily, Metoprolol tartrate 50 mg twice daily, Lasix 20 mg once daily, K+ 40 mEq once daily.  We discussed the benefits of SGLT2 inhib. As she is euvolemic and has minimal shortness of breath, I am not certain she would benefit from this. If she has a change in her symptoms, we can consider it in the future.   Hyperlipidemia with target low density lipoprotein (LDL) cholesterol less than 70 mg/dL Triglycerides elevated in Sept 2023. Vascepa was added. Continue Vascepa 2g twice daily, Crestor 40 mg once daily. F/u fasting Lipids and LFTs today.   Stage 3b chronic kidney disease ([place]) Labs obtained through [facility] demonstrate a creatinine 1.81. GFR is 28.1.  Dual implantable cardioverter-defibrillator in situ F/u with EP as planned.         Dispo:  Return in about 6 months (around 02/25/2023) for Routine Follow Up, w/ Dr. Burt Knack, or Richardson Dopp, PA-C.    Medication Adjustments/Labs and Tests Ordered: Current medicines are reviewed at length with the patient today.  Concerns regarding medicines are outlined above.  Tests Ordered: No orders of the defined types were placed in this encounter.  Medication Changes: No orders of the defined types were placed in this encounter.  Signed, Richardson Dopp, PA-C  08/26/2022 10:08 AM    [facility] [address], [place], [place]  [address] Phone: [phone]; Fax: [phone]

## 2022-08-26 ENCOUNTER — Encounter: Payer: Self-pay | Admitting: Physician Assistant

## 2022-08-26 ENCOUNTER — Ambulatory Visit: Payer: Medicare PPO | Attending: Physician Assistant | Admitting: Physician Assistant

## 2022-08-26 VITALS — BP 117/60 | HR 62 | Ht 64.0 in | Wt 143.8 lb

## 2022-08-26 DIAGNOSIS — Z95810 Presence of automatic (implantable) cardiac defibrillator: Secondary | ICD-10-CM

## 2022-08-26 DIAGNOSIS — N1832 Chronic kidney disease, stage 3b: Secondary | ICD-10-CM | POA: Diagnosis not present

## 2022-08-26 DIAGNOSIS — I5020 Unspecified systolic (congestive) heart failure: Secondary | ICD-10-CM | POA: Diagnosis not present

## 2022-08-26 DIAGNOSIS — E785 Hyperlipidemia, unspecified: Secondary | ICD-10-CM

## 2022-08-26 DIAGNOSIS — I2510 Atherosclerotic heart disease of native coronary artery without angina pectoris: Secondary | ICD-10-CM | POA: Diagnosis not present

## 2022-08-26 LAB — LIPID PANEL
Chol/HDL Ratio: 3.3 ratio (ref 0.0–4.4)
Cholesterol, Total: 111 mg/dL (ref 100–199)
HDL: 34 mg/dL — ABNORMAL LOW (ref >39)
LDL Chol Calc (NIH): 48 mg/dL (ref 0–99)
Triglycerides: 177 mg/dL — ABNORMAL HIGH (ref 0–149)
VLDL Cholesterol Cal: 29 mg/dL (ref 5–40)

## 2022-08-26 LAB — HEPATIC FUNCTION PANEL
ALT: 20 IU/L (ref 0–32)
AST: 19 IU/L (ref 0–40)
Albumin: 4.8 g/dL (ref 3.9–4.9)
Alkaline Phosphatase: 149 IU/L — ABNORMAL HIGH (ref 44–121)
Bilirubin Total: 0.3 mg/dL (ref 0.0–1.2)
Bilirubin, Direct: 0.12 mg/dL (ref 0.00–0.40)
Total Protein: 7.2 g/dL (ref 6.0–8.5)

## 2022-08-26 NOTE — Assessment & Plan Note (Addendum)
Ischemic CM.  EF ~ 40.  NYHA II.  Volume status stable.  Low BP has limited GDMT.  Continue Lisinopril 2.5 mg once daily, Metoprolol tartrate 50 mg twice daily, Lasix 20 mg once daily, K+ 40 mEq once daily.  We discussed the benefits of SGLT2 inhib. As she is euvolemic and has minimal shortness of breath, I am not certain she would benefit from this. If she has a change in her symptoms, we can consider it in the future.

## 2022-08-26 NOTE — Patient Instructions (Signed)
Medication Instructions:  Your physician recommends that you continue on your current medications as directed. Please refer to the Current Medication list given to you today.  *If you need a refill on your cardiac medications before your next appointment, please call your pharmacy*   Lab Work: Lipids, LFT If you have labs (blood work) drawn today and your tests are completely normal, you will receive your results only by: [place] (if you have MyChart) OR A paper copy in the mail If you have any lab test that is abnormal or we need to change your treatment, we will call you to review the results.  Follow-Up: At [facility], you and your health needs are our priority.  As part of our continuing mission to provide you with exceptional heart care, we have created designated Provider Care Teams.  These Care Teams include your primary Cardiologist (physician) and Advanced Practice Providers (APPs -  Physician Assistants and Nurse Practitioners) who all work together to provide you with the care you need, when you need it.   Your next appointment:   6 month(s)  The format for your next appointment:   In Person  Provider:   Richardson Dopp, PA-C       Important Information About Sugar

## 2022-08-26 NOTE — Assessment & Plan Note (Signed)
Triglycerides elevated in Sept 2023. Vascepa was added. Continue Vascepa 2g twice daily, Crestor 40 mg once daily. F/u fasting Lipids and LFTs today.

## 2022-08-26 NOTE — Assessment & Plan Note (Signed)
Labs obtained through [facility] demonstrate a creatinine 1.81. GFR is 28.1.

## 2022-08-26 NOTE — Assessment & Plan Note (Signed)
Hx of anterior MI in 2010 tx with BMS to the LAD. She is stable on her current regimen without angina. Continue ASA 81 mg once daily, Crestor 40 mg once daily, Lopressor 50 mg twice daily. F/u in 6 mos.

## 2022-08-26 NOTE — Assessment & Plan Note (Signed)
F/u with EP as planned.

## 2022-08-31 NOTE — Progress Notes (Signed)
Pt has been made aware of normal result and verbalized understanding.  jw

## 2022-09-02 ENCOUNTER — Other Ambulatory Visit: Payer: Medicare PPO

## 2022-09-05 ENCOUNTER — Other Ambulatory Visit: Payer: Medicare PPO

## 2022-09-16 NOTE — Progress Notes (Signed)
Remote ICD transmission.   

## 2022-11-15 ENCOUNTER — Other Ambulatory Visit: Payer: Self-pay | Admitting: Cardiovascular Disease

## 2022-11-23 ENCOUNTER — Ambulatory Visit: Payer: Medicare PPO

## 2022-11-23 DIAGNOSIS — I255 Ischemic cardiomyopathy: Secondary | ICD-10-CM | POA: Diagnosis not present

## 2022-11-23 LAB — CUP PACEART REMOTE DEVICE CHECK
Battery Remaining Longevity: 73 mo
Battery Voltage: 2.94 V
Brady Statistic RV Percent Paced: 0.06 %
Date Time Interrogation Session: 20240306033324
HighPow Impedance: 81 Ohm
Implantable Lead Connection Status: 753985
Implantable Lead Implant Date: 20110110
Implantable Lead Location: 753860
Implantable Lead Model: 6935
Implantable Pulse Generator Implant Date: 20190301
Lead Channel Impedance Value: 437 Ohm
Lead Channel Impedance Value: 513 Ohm
Lead Channel Pacing Threshold Amplitude: 0.625 V
Lead Channel Pacing Threshold Pulse Width: 0.4 ms
Lead Channel Sensing Intrinsic Amplitude: 17.125 mV
Lead Channel Sensing Intrinsic Amplitude: 17.125 mV
Lead Channel Setting Pacing Amplitude: 2.5 V
Lead Channel Setting Pacing Pulse Width: 0.4 ms
Lead Channel Setting Sensing Sensitivity: 0.3 mV
Zone Setting Status: 755011

## 2022-11-28 ENCOUNTER — Other Ambulatory Visit: Payer: Self-pay | Admitting: Cardiovascular Disease

## 2022-12-22 ENCOUNTER — Other Ambulatory Visit: Payer: Self-pay | Admitting: Cardiovascular Disease

## 2022-12-27 ENCOUNTER — Encounter: Payer: Self-pay | Admitting: Internal Medicine

## 2022-12-27 ENCOUNTER — Ambulatory Visit: Payer: Medicare PPO | Attending: Internal Medicine | Admitting: Internal Medicine

## 2022-12-27 VITALS — BP 118/60 | HR 56 | Ht 64.0 in | Wt 149.2 lb

## 2022-12-27 DIAGNOSIS — I255 Ischemic cardiomyopathy: Secondary | ICD-10-CM

## 2022-12-27 DIAGNOSIS — I472 Ventricular tachycardia, unspecified: Secondary | ICD-10-CM | POA: Diagnosis not present

## 2022-12-27 DIAGNOSIS — I471 Supraventricular tachycardia, unspecified: Secondary | ICD-10-CM

## 2022-12-27 DIAGNOSIS — I5020 Unspecified systolic (congestive) heart failure: Secondary | ICD-10-CM

## 2022-12-27 DIAGNOSIS — Z95810 Presence of automatic (implantable) cardiac defibrillator: Secondary | ICD-10-CM | POA: Diagnosis not present

## 2022-12-27 NOTE — Progress Notes (Signed)
Patient Care Team: Elfredia Nevins, MD as PCP - General (Internal Medicine) Tonny Bollman, MD as PCP - Cardiology (Cardiology) Duke Salvia, MD as PCP - Electrophysiology (Cardiology) Karie Soda, MD as Consulting Physician (General Surgery) Charlott Rakes, MD as Consulting Physician (Gastroenterology) Kennon Rounds as Physician Assistant (Cardiology)   HPI  [name] [name] is a 67 y.o. female  seen in followup for an ICD Medtronic  implanted for primary prevention OCT 2011, gen change 3/19 . She is a 67 year-old woman s/p anterior MI 2010, who underwent primary PCI at that time. She had persisent severe LV dysfunction   The patient denies chest pain, shortness of breath, nocturnal dyspnea, orthopnea or peripheral edema.  There have been no palpitations, lightheadedness or syncope.     Interval therapy fall 2022 . Rx with ATP    Appropriate Therapy no Inappropriate Therapy yes   DATE TEST EF   2/14 Echo   35-40 %   11/20 Echo   **60-65*  % Images reviewed by [facility]-- prob stable moderate LV dysfn          Date Cr K Hgb LDL  2/19 1.26 4.1 11.6    3/23 1.43 4.7 12.0 48       Past Medical History:  Diagnosis Date   AICD (automatic cardioverter/defibrillator) present    Anemia    Cervical disc disease    disabled police officer   Coronary artery disease    Depression    Dyslipidemia    Dysrhythmia    GERD (gastroesophageal reflux disease)    ICD (implantable cardiac defibrillator), single MDT    treated with BMS to LAD   Ischemic cardiomyopathy    post-MI LVEF less than 30%; acute AMI Rx w BMS>>LAD   Myocardial infarction    Uterine cancer    age 15, s/p partial hysterectomy    Past Surgical History:  Procedure Laterality Date   ABDOMINAL HYSTERECTOMY     BIOPSY  04/24/2020   Procedure: BIOPSY;  Surgeon: Charlott Rakes, MD;  Location[facility] ENDOSCOPY;  Service: Endoscopy;;   CERVICAL DISC SURGERY     COLONOSCOPY WITH PROPOFOL N/A 04/24/2020    Procedure: COLONOSCOPY WITH PROPOFOL;  Surgeon: Charlott Rakes, MD;  Location: [facility] ENDOSCOPY;  Service: Endoscopy;  Laterality: N/A;   CORONARY ANGIOPLASTY WITH STENT PLACEMENT     ESOPHAGOGASTRODUODENOSCOPY (EGD) WITH PROPOFOL N/A 04/24/2020   Procedure: ESOPHAGOGASTRODUODENOSCOPY (EGD) WITH PROPOFOL;  Surgeon: Charlott Rakes, MD;  Location: [facility] ENDOSCOPY;  Service: Endoscopy;  Laterality: N/A;   HOT HEMOSTASIS N/A 04/24/2020   Procedure: HOT HEMOSTASIS (ARGON PLASMA COAGULATION/BICAP);  Surgeon: Charlott Rakes, MD;  Location: [facility] ENDOSCOPY;  Service: Endoscopy;  Laterality: N/A;   ICD GENERATOR CHANGEOUT N/A 11/17/2017   Procedure: ICD GENERATOR CHANGEOUT;  Surgeon: Duke Salvia, MD;  Location: [facility] INVASIVE CV LAB;  Service: Cardiovascular;  Laterality: N/A;   icd implanted     MASTECTOMY, PARTIAL Right    PARTIAL HYSTERECTOMY     POLYPECTOMY  04/24/2020   Procedure: POLYPECTOMY;  Surgeon: Charlott Rakes, MD;  Location: [facility] ENDOSCOPY;  Service: Endoscopy;;   SUBMUCOSAL TATTOO INJECTION  04/24/2020   Procedure: SUBMUCOSAL TATTOO INJECTION;  Surgeon: Charlott Rakes, MD;  Location[facility] ENDOSCOPY;  Service: Endoscopy;;    Current Outpatient Medications  Medication Sig Dispense Refill   acetaminophen (TYLENOL) 325 MG tablet Take 650 mg by mouth every 6 (six) hours as needed for mild pain or moderate pain.     aspirin EC  81 MG tablet Take 1 tablet (81 mg total) by mouth daily. 30 tablet 11   DULoxetine (CYMBALTA) 60 MG capsule Take 60 mg by mouth daily.   3   furosemide (LASIX) 20 MG tablet Take 1 tablet (20 mg total) by mouth every other day. 45 tablet 3   icosapent Ethyl (VASCEPA) 1 g capsule Take 2 capsules (2 g total) by mouth 2 (two) times daily. 360 capsule 3   levothyroxine (SYNTHROID) 25 MCG tablet Take 25 mcg by mouth daily.     lisinopril (ZESTRIL) 2.5 MG tablet TAKE 1 TABLET(2.5 MG) BY MOUTH DAILY 90 tablet 1   metoprolol tartrate (LOPRESSOR) 50 MG tablet TAKE 1 TABLET(50 MG) BY  MOUTH TWICE DAILY 180 tablet 3   nitroGLYCERIN (NITROSTAT) 0.4 MG SL tablet Place 0.4 mg under the tongue every 5 (five) minutes x 3 doses as needed for chest pain.     pantoprazole (PROTONIX) 40 MG tablet TAKE 1 TABLET(40 MG) BY MOUTH DAILY 90 tablet 3   potassium chloride SA (KLOR-CON M) 20 MEQ tablet TAKE 2 TABLETS BY MOUTH EVERY OTHER DAY 30 tablet 7   rosuvastatin (CRESTOR) 40 MG tablet TAKE 1 TABLET BY MOUTH EVERY DAY. KEEP UPCOMING APPT FOR ANY REFILLS 30 tablet 9   No current facility-administered medications for this visit.    No Known Allergies  Review of Systems negative except from HPI and PMH  Physical Exam BP 118/60   Pulse (!) 56   Ht 5\' 4"  (1.626 m)   Wt 149 lb 3.2 oz (67.7 kg)   SpO2 97%   BMI 25.61 kg/m  Well developed and well nourished in no acute distress HENT normal Neck supple with JVP-flat Clear Device pocket well healed; without hematoma or erythema.  There is no tethering  Regular rate and rhythm, no  gallop No  murmur Abd-soft with active BS No Clubbing cyanosis  edema Skin-warm and dry A & Oriented  Grossly normal sensory and motor function  ECG sinus @ 56 17/08/40  Device function is normal. Programming changes none  See Pace[place] for details    Assessment and recommendations   Ischemic Cardiomyopathy     CHF systolic chronic    VT  SVT  ICD Medtronic    No intercurrent ventricular arrhythmias.  Continue metoprolol  No symptoms of angina.  Continue Crestor and aspirin  Euvolemic.  Continue furosemide

## 2022-12-27 NOTE — Patient Instructions (Signed)
Medication Instructions:  Your physician recommends that you continue on your current medications as directed. Please refer to the Current Medication list given to you today.  *If you need a refill on your cardiac medications before your next appointment, please call your pharmacy*   Lab Work: None ordered.  If you have labs (blood work) drawn today and your tests are completely normal, you will receive your results only by: MyChart Message (if you have MyChart) OR A paper copy in the mail If you have any lab test that is abnormal or we need to change your treatment, we will call you to review the results.   Testing/Procedures: None ordered.    Follow-Up: At [facility], you and your health needs are our priority.  As part of our continuing mission to provide you with exceptional heart care, we have created designated Provider Care Teams.  These Care Teams include your primary Cardiologist (physician) and Advanced Practice Providers (APPs -  Physician Assistants and Nurse Practitioners) who all work together to provide you with the care you need, when you need it.  We recommend signing up for the patient portal called "MyChart".  Sign up information is provided on this After Visit Summary.  MyChart is used to connect with patients for Virtual Visits (Telemedicine).  Patients are able to view lab/test results, encounter notes, upcoming appointments, etc.  Non-urgent messages can be sent to your provider as well.   To learn more about what you can do with MyChart, go to [email or web address].    Your next appointment:   12 months with Dr Klein 

## 2022-12-28 NOTE — Progress Notes (Signed)
Remote ICD transmission.   

## 2023-01-06 ENCOUNTER — Other Ambulatory Visit: Payer: Self-pay | Admitting: Cardiovascular Disease

## 2023-01-16 ENCOUNTER — Other Ambulatory Visit: Payer: Self-pay | Admitting: Internal Medicine

## 2023-01-31 ENCOUNTER — Other Ambulatory Visit: Payer: Self-pay | Admitting: Physician Assistant

## 2023-02-22 ENCOUNTER — Ambulatory Visit (INDEPENDENT_AMBULATORY_CARE_PROVIDER_SITE_OTHER): Payer: Medicare PPO

## 2023-02-22 DIAGNOSIS — I255 Ischemic cardiomyopathy: Secondary | ICD-10-CM

## 2023-02-22 LAB — CUP PACEART REMOTE DEVICE CHECK
Battery Remaining Longevity: 69 mo
Battery Voltage: 2.94 V
Brady Statistic RV Percent Paced: 0.09 %
Date Time Interrogation Session: 20240605033624
HighPow Impedance: 82 Ohm
Implantable Lead Connection Status: 753985
Implantable Lead Implant Date: 20110110
Implantable Lead Location: 753860
Implantable Lead Model: 6935
Implantable Pulse Generator Implant Date: 20190301
Lead Channel Impedance Value: 494 Ohm
Lead Channel Impedance Value: 570 Ohm
Lead Channel Pacing Threshold Amplitude: 0.625 V
Lead Channel Pacing Threshold Pulse Width: 0.4 ms
Lead Channel Sensing Intrinsic Amplitude: 23 mV
Lead Channel Sensing Intrinsic Amplitude: 23 mV
Lead Channel Setting Pacing Amplitude: 2.5 V
Lead Channel Setting Pacing Pulse Width: 0.4 ms
Lead Channel Setting Sensing Sensitivity: 0.3 mV
Zone Setting Status: 755011

## 2023-03-17 NOTE — Progress Notes (Signed)
Remote ICD transmission.   

## 2023-04-18 DIAGNOSIS — E663 Overweight: Secondary | ICD-10-CM | POA: Diagnosis not present

## 2023-04-18 DIAGNOSIS — D518 Other vitamin B12 deficiency anemias: Secondary | ICD-10-CM | POA: Diagnosis not present

## 2023-04-18 DIAGNOSIS — E039 Hypothyroidism, unspecified: Secondary | ICD-10-CM | POA: Diagnosis not present

## 2023-04-18 DIAGNOSIS — I5022 Chronic systolic (congestive) heart failure: Secondary | ICD-10-CM | POA: Diagnosis not present

## 2023-04-18 DIAGNOSIS — Z6826 Body mass index (BMI) 26.0-26.9, adult: Secondary | ICD-10-CM | POA: Diagnosis not present

## 2023-04-18 DIAGNOSIS — Z0001 Encounter for general adult medical examination with abnormal findings: Secondary | ICD-10-CM | POA: Diagnosis not present

## 2023-04-18 DIAGNOSIS — Z1331 Encounter for screening for depression: Secondary | ICD-10-CM | POA: Diagnosis not present

## 2023-04-18 DIAGNOSIS — K219 Gastro-esophageal reflux disease without esophagitis: Secondary | ICD-10-CM | POA: Diagnosis not present

## 2023-04-18 DIAGNOSIS — I420 Dilated cardiomyopathy: Secondary | ICD-10-CM | POA: Diagnosis not present

## 2023-04-18 DIAGNOSIS — N183 Chronic kidney disease, stage 3 unspecified: Secondary | ICD-10-CM | POA: Diagnosis not present

## 2023-04-18 DIAGNOSIS — E559 Vitamin D deficiency, unspecified: Secondary | ICD-10-CM | POA: Diagnosis not present

## 2023-04-28 DIAGNOSIS — E875 Hyperkalemia: Secondary | ICD-10-CM | POA: Diagnosis not present

## 2023-05-24 ENCOUNTER — Ambulatory Visit (INDEPENDENT_AMBULATORY_CARE_PROVIDER_SITE_OTHER): Payer: Medicare PPO

## 2023-05-24 DIAGNOSIS — I255 Ischemic cardiomyopathy: Secondary | ICD-10-CM

## 2023-05-24 LAB — CUP PACEART REMOTE DEVICE CHECK
Battery Remaining Longevity: 66 mo
Battery Voltage: 2.93 V
Brady Statistic RV Percent Paced: 0.06 %
Date Time Interrogation Session: 20240904012304
HighPow Impedance: 75 Ohm
Implantable Lead Connection Status: 753985
Implantable Lead Implant Date: 20110110
Implantable Lead Location: 753860
Implantable Lead Model: 6935
Implantable Pulse Generator Implant Date: 20190301
Lead Channel Impedance Value: 494 Ohm
Lead Channel Impedance Value: 513 Ohm
Lead Channel Pacing Threshold Amplitude: 0.625 V
Lead Channel Pacing Threshold Pulse Width: 0.4 ms
Lead Channel Sensing Intrinsic Amplitude: 19 mV
Lead Channel Sensing Intrinsic Amplitude: 19 mV
Lead Channel Setting Pacing Amplitude: 2.5 V
Lead Channel Setting Pacing Pulse Width: 0.4 ms
Lead Channel Setting Sensing Sensitivity: 0.3 mV
Zone Setting Status: 755011

## 2023-06-06 NOTE — Progress Notes (Signed)
Remote ICD transmission.   

## 2023-08-23 ENCOUNTER — Ambulatory Visit (INDEPENDENT_AMBULATORY_CARE_PROVIDER_SITE_OTHER): Payer: Medicare PPO

## 2023-08-23 DIAGNOSIS — I255 Ischemic cardiomyopathy: Secondary | ICD-10-CM | POA: Diagnosis not present

## 2023-08-23 LAB — CUP PACEART REMOTE DEVICE CHECK
Battery Remaining Longevity: 62 mo
Battery Voltage: 2.92 V
Brady Statistic RV Percent Paced: 0.08 %
Date Time Interrogation Session: 20241204052607
HighPow Impedance: 80 Ohm
Implantable Lead Connection Status: 753985
Implantable Lead Implant Date: 20110110
Implantable Lead Location: 753860
Implantable Lead Model: 6935
Implantable Pulse Generator Implant Date: 20190301
Lead Channel Impedance Value: 475 Ohm
Lead Channel Impedance Value: 570 Ohm
Lead Channel Pacing Threshold Amplitude: 0.625 V
Lead Channel Pacing Threshold Pulse Width: 0.4 ms
Lead Channel Sensing Intrinsic Amplitude: 22.75 mV
Lead Channel Sensing Intrinsic Amplitude: 22.75 mV
Lead Channel Setting Pacing Amplitude: 2.5 V
Lead Channel Setting Pacing Pulse Width: 0.4 ms
Lead Channel Setting Sensing Sensitivity: 0.3 mV
Zone Setting Status: 755011

## 2023-11-22 ENCOUNTER — Ambulatory Visit (INDEPENDENT_AMBULATORY_CARE_PROVIDER_SITE_OTHER): Payer: Medicare PPO

## 2023-11-22 DIAGNOSIS — I255 Ischemic cardiomyopathy: Secondary | ICD-10-CM

## 2023-11-24 LAB — CUP PACEART REMOTE DEVICE CHECK
Battery Remaining Longevity: 58 mo
Battery Voltage: 2.9 V
Brady Statistic RV Percent Paced: 0.07 %
Date Time Interrogation Session: 20250305043824
HighPow Impedance: 81 Ohm
Implantable Lead Connection Status: 753985
Implantable Lead Implant Date: 20110110
Implantable Lead Location: 753860
Implantable Lead Model: 6935
Implantable Pulse Generator Implant Date: 20190301
Lead Channel Impedance Value: 494 Ohm
Lead Channel Impedance Value: 570 Ohm
Lead Channel Pacing Threshold Amplitude: 0.75 V
Lead Channel Pacing Threshold Pulse Width: 0.4 ms
Lead Channel Sensing Intrinsic Amplitude: 19.625 mV
Lead Channel Sensing Intrinsic Amplitude: 19.625 mV
Lead Channel Setting Pacing Amplitude: 2.5 V
Lead Channel Setting Pacing Pulse Width: 0.4 ms
Lead Channel Setting Sensing Sensitivity: 0.3 mV
Zone Setting Status: 755011

## 2023-12-20 ENCOUNTER — Other Ambulatory Visit: Payer: Self-pay | Admitting: Cardiovascular Disease

## 2023-12-30 ENCOUNTER — Encounter: Payer: Self-pay | Admitting: Internal Medicine

## 2024-01-05 NOTE — Progress Notes (Signed)
 Remote ICD transmission.

## 2024-01-05 NOTE — Addendum Note (Signed)
 Addended by: Lott Rouleau A on: 01/05/2024 10:16 AM   Modules accepted: Orders

## 2024-01-10 ENCOUNTER — Other Ambulatory Visit: Payer: Self-pay | Admitting: Internal Medicine

## 2024-02-08 ENCOUNTER — Other Ambulatory Visit: Payer: Self-pay | Admitting: Cardiovascular Disease

## 2024-02-08 ENCOUNTER — Other Ambulatory Visit: Payer: Self-pay | Admitting: Internal Medicine

## 2024-02-21 ENCOUNTER — Ambulatory Visit (INDEPENDENT_AMBULATORY_CARE_PROVIDER_SITE_OTHER): Payer: Medicare PPO

## 2024-02-21 DIAGNOSIS — I255 Ischemic cardiomyopathy: Secondary | ICD-10-CM

## 2024-02-21 LAB — CUP PACEART REMOTE DEVICE CHECK
Battery Remaining Longevity: 55 mo
Battery Voltage: 2.99 V
Brady Statistic RV Percent Paced: 0.17 %
Date Time Interrogation Session: 20250604012505
HighPow Impedance: 80 Ohm
Implantable Lead Connection Status: 753985
Implantable Lead Implant Date: 20110110
Implantable Lead Location: 753860
Implantable Lead Model: 6935
Implantable Pulse Generator Implant Date: 20190301
Lead Channel Impedance Value: 494 Ohm
Lead Channel Impedance Value: 570 Ohm
Lead Channel Pacing Threshold Amplitude: 0.625 V
Lead Channel Pacing Threshold Pulse Width: 0.4 ms
Lead Channel Sensing Intrinsic Amplitude: 22.625 mV
Lead Channel Sensing Intrinsic Amplitude: 22.625 mV
Lead Channel Setting Pacing Amplitude: 2.5 V
Lead Channel Setting Pacing Pulse Width: 0.4 ms
Lead Channel Setting Sensing Sensitivity: 0.3 mV
Zone Setting Status: 755011

## 2024-03-01 ENCOUNTER — Ambulatory Visit: Payer: Self-pay | Admitting: Cardiology

## 2024-03-09 ENCOUNTER — Other Ambulatory Visit: Payer: Self-pay | Admitting: Internal Medicine

## 2024-03-12 ENCOUNTER — Other Ambulatory Visit: Payer: Self-pay | Admitting: Internal Medicine

## 2024-03-14 ENCOUNTER — Other Ambulatory Visit: Payer: Self-pay | Admitting: Cardiovascular Disease

## 2024-04-15 NOTE — Progress Notes (Signed)
 Remote ICD transmission.

## 2024-05-22 ENCOUNTER — Ambulatory Visit: Payer: Medicare PPO

## 2024-05-22 DIAGNOSIS — I255 Ischemic cardiomyopathy: Secondary | ICD-10-CM | POA: Diagnosis not present

## 2024-05-23 ENCOUNTER — Ambulatory Visit: Payer: Self-pay | Admitting: Cardiology

## 2024-05-23 LAB — CUP PACEART REMOTE DEVICE CHECK
Battery Remaining Longevity: 50 mo
Battery Voltage: 2.99 V
Brady Statistic RV Percent Paced: 0.15 %
Date Time Interrogation Session: 20250903022603
HighPow Impedance: 72 Ohm
Implantable Lead Connection Status: 753985
Implantable Lead Implant Date: 20110110
Implantable Lead Location: 753860
Implantable Lead Model: 6935
Implantable Pulse Generator Implant Date: 20190301
Lead Channel Impedance Value: 494 Ohm
Lead Channel Impedance Value: 570 Ohm
Lead Channel Pacing Threshold Amplitude: 0.625 V
Lead Channel Pacing Threshold Pulse Width: 0.4 ms
Lead Channel Sensing Intrinsic Amplitude: 20.125 mV
Lead Channel Sensing Intrinsic Amplitude: 20.125 mV
Lead Channel Setting Pacing Amplitude: 2.5 V
Lead Channel Setting Pacing Pulse Width: 0.4 ms
Lead Channel Setting Sensing Sensitivity: 0.3 mV
Zone Setting Status: 755011

## 2024-06-01 NOTE — Progress Notes (Signed)
Remote ICD Transmission.

## 2024-06-10 ENCOUNTER — Telehealth: Payer: Self-pay | Admitting: *Deleted

## 2024-06-10 ENCOUNTER — Inpatient Hospital Stay (HOSPITAL_COMMUNITY)

## 2024-06-10 ENCOUNTER — Other Ambulatory Visit: Payer: Self-pay

## 2024-06-10 ENCOUNTER — Inpatient Hospital Stay (HOSPITAL_COMMUNITY)
Admission: EM | Admit: 2024-06-10 | Discharge: 2024-06-14 | DRG: 286 | Disposition: A | Discharge status: Home / Self Care | Source: Ambulatory Visit | Attending: Internal Medicine | Admitting: Internal Medicine

## 2024-06-10 ENCOUNTER — Observation Stay (HOSPITAL_COMMUNITY)

## 2024-06-10 DIAGNOSIS — N1832 Chronic kidney disease, stage 3b: Secondary | ICD-10-CM | POA: Diagnosis present

## 2024-06-10 DIAGNOSIS — I2510 Atherosclerotic heart disease of native coronary artery without angina pectoris: Secondary | ICD-10-CM | POA: Diagnosis present

## 2024-06-10 DIAGNOSIS — R519 Headache, unspecified: Secondary | ICD-10-CM | POA: Diagnosis not present

## 2024-06-10 DIAGNOSIS — Z90711 Acquired absence of uterus with remaining cervical stump: Secondary | ICD-10-CM | POA: Diagnosis not present

## 2024-06-10 DIAGNOSIS — E785 Hyperlipidemia, unspecified: Secondary | ICD-10-CM | POA: Diagnosis not present

## 2024-06-10 DIAGNOSIS — I493 Ventricular premature depolarization: Secondary | ICD-10-CM | POA: Diagnosis present

## 2024-06-10 DIAGNOSIS — I4729 Other ventricular tachycardia: Secondary | ICD-10-CM | POA: Diagnosis not present

## 2024-06-10 DIAGNOSIS — K219 Gastro-esophageal reflux disease without esophagitis: Secondary | ICD-10-CM | POA: Diagnosis present

## 2024-06-10 DIAGNOSIS — Z79899 Other long term (current) drug therapy: Secondary | ICD-10-CM | POA: Diagnosis not present

## 2024-06-10 DIAGNOSIS — I252 Old myocardial infarction: Secondary | ICD-10-CM | POA: Diagnosis not present

## 2024-06-10 DIAGNOSIS — Z8542 Personal history of malignant neoplasm of other parts of uterus: Secondary | ICD-10-CM | POA: Diagnosis not present

## 2024-06-10 DIAGNOSIS — Z79890 Hormone replacement therapy (postmenopausal): Secondary | ICD-10-CM

## 2024-06-10 DIAGNOSIS — I5030 Unspecified diastolic (congestive) heart failure: Secondary | ICD-10-CM | POA: Diagnosis not present

## 2024-06-10 DIAGNOSIS — Z8249 Family history of ischemic heart disease and other diseases of the circulatory system: Secondary | ICD-10-CM | POA: Diagnosis not present

## 2024-06-10 DIAGNOSIS — N183 Chronic kidney disease, stage 3 unspecified: Secondary | ICD-10-CM

## 2024-06-10 DIAGNOSIS — Z981 Arthrodesis status: Secondary | ICD-10-CM | POA: Diagnosis not present

## 2024-06-10 DIAGNOSIS — Z955 Presence of coronary angioplasty implant and graft: Secondary | ICD-10-CM | POA: Diagnosis not present

## 2024-06-10 DIAGNOSIS — Z95810 Presence of automatic (implantable) cardiac defibrillator: Secondary | ICD-10-CM

## 2024-06-10 DIAGNOSIS — I4891 Unspecified atrial fibrillation: Secondary | ICD-10-CM | POA: Diagnosis not present

## 2024-06-10 DIAGNOSIS — Z9011 Acquired absence of right breast and nipple: Secondary | ICD-10-CM | POA: Diagnosis not present

## 2024-06-10 DIAGNOSIS — I5023 Acute on chronic systolic (congestive) heart failure: Secondary | ICD-10-CM | POA: Diagnosis present

## 2024-06-10 DIAGNOSIS — Z7982 Long term (current) use of aspirin: Secondary | ICD-10-CM | POA: Diagnosis not present

## 2024-06-10 DIAGNOSIS — F17210 Nicotine dependence, cigarettes, uncomplicated: Secondary | ICD-10-CM | POA: Diagnosis present

## 2024-06-10 DIAGNOSIS — I5020 Unspecified systolic (congestive) heart failure: Secondary | ICD-10-CM | POA: Diagnosis present

## 2024-06-10 DIAGNOSIS — I4901 Ventricular fibrillation: Secondary | ICD-10-CM | POA: Diagnosis not present

## 2024-06-10 DIAGNOSIS — I255 Ischemic cardiomyopathy: Secondary | ICD-10-CM | POA: Diagnosis present

## 2024-06-10 DIAGNOSIS — I959 Hypotension, unspecified: Secondary | ICD-10-CM | POA: Diagnosis not present

## 2024-06-10 LAB — CBC WITH DIFFERENTIAL/PLATELET
Abs Immature Granulocytes: 0.02 K/uL (ref 0.00–0.07)
Basophils Absolute: 0.1 K/uL (ref 0.0–0.1)
Basophils Relative: 1 %
Eosinophils Absolute: 0.1 K/uL (ref 0.0–0.5)
Eosinophils Relative: 1 %
HCT: 41.9 % (ref 36.0–46.0)
Hemoglobin: 13.3 g/dL (ref 12.0–15.0)
Immature Granulocytes: 0 %
Lymphocytes Relative: 28 %
Lymphs Abs: 2 K/uL (ref 0.7–4.0)
MCH: 29 pg (ref 26.0–34.0)
MCHC: 31.7 g/dL (ref 30.0–36.0)
MCV: 91.5 fL (ref 80.0–100.0)
Monocytes Absolute: 0.6 K/uL (ref 0.1–1.0)
Monocytes Relative: 8 %
Neutro Abs: 4.4 K/uL (ref 1.7–7.7)
Neutrophils Relative %: 62 %
Platelets: 174 K/uL (ref 150–400)
RBC: 4.58 MIL/uL (ref 3.87–5.11)
RDW: 14.3 % (ref 11.5–15.5)
WBC: 7.2 K/uL (ref 4.0–10.5)
nRBC: 0 % (ref 0.0–0.2)

## 2024-06-10 LAB — COMPREHENSIVE METABOLIC PANEL WITH GFR
ALT: 43 U/L (ref 0–44)
AST: 38 U/L (ref 15–41)
Albumin: 4.2 g/dL (ref 3.5–5.0)
Alkaline Phosphatase: 135 U/L — ABNORMAL HIGH (ref 38–126)
Anion gap: 12 (ref 5–15)
BUN: 21 mg/dL (ref 8–23)
CO2: 21 mmol/L — ABNORMAL LOW (ref 22–32)
Calcium: 9.7 mg/dL (ref 8.9–10.3)
Chloride: 104 mmol/L (ref 98–111)
Creatinine, Ser: 1.44 mg/dL — ABNORMAL HIGH (ref 0.44–1.00)
GFR, Estimated: 40 mL/min — ABNORMAL LOW (ref >60)
Glucose, Bld: 98 mg/dL (ref 70–99)
Potassium: 4.4 mmol/L (ref 3.5–5.1)
Sodium: 137 mmol/L (ref 135–145)
Total Bilirubin: 0.7 mg/dL (ref 0.0–1.2)
Total Protein: 7.9 g/dL (ref 6.5–8.1)

## 2024-06-10 LAB — BRAIN NATRIURETIC PEPTIDE: B Natriuretic Peptide: 47 pg/mL (ref 0.0–100.0)

## 2024-06-10 LAB — TROPONIN I (HIGH SENSITIVITY): Troponin I (High Sensitivity): 13 ng/L (ref <18)

## 2024-06-10 LAB — MAGNESIUM: Magnesium: 2.3 mg/dL (ref 1.7–2.4)

## 2024-06-10 LAB — MRSA NEXT GEN BY PCR, NASAL: MRSA by PCR Next Gen: NOT DETECTED

## 2024-06-10 MED ORDER — METOPROLOL TARTRATE 50 MG PO TABS
50.0000 mg | ORAL_TABLET | Freq: Two times a day (BID) | ORAL | Status: DC
  Administered 2024-06-10 – 2024-06-12 (×4): 50 mg via ORAL
  Filled 2024-06-10 (×4): qty 1

## 2024-06-10 MED ORDER — ONDANSETRON HCL 4 MG PO TABS: 4.0000 mg | ORAL_TABLET | Freq: Four times a day (QID) | ORAL | Status: DC | PRN

## 2024-06-10 MED ORDER — POLYETHYLENE GLYCOL 3350 17 G PO PACK: 17.0000 g | PACK | Freq: Every day | ORAL | Status: DC | PRN

## 2024-06-10 MED ORDER — TRAMADOL HCL 50 MG PO TABS: 50.0000 mg | ORAL_TABLET | Freq: Four times a day (QID) | ORAL | Status: DC | PRN

## 2024-06-10 MED ORDER — ROSUVASTATIN CALCIUM 20 MG PO TABS
40.0000 mg | ORAL_TABLET | Freq: Every day | ORAL | Status: DC
  Administered 2024-06-10 – 2024-06-14 (×5): 40 mg via ORAL
  Filled 2024-06-10 (×5): qty 2

## 2024-06-10 MED ORDER — AMIODARONE HCL IN DEXTROSE 360-4.14 MG/200ML-% IV SOLN
60.0000 mg/h | INTRAVENOUS | Status: AC
Start: 2024-06-10 — End: 2024-06-11
  Administered 2024-06-10 – 2024-06-11 (×2): 60 mg/h via INTRAVENOUS
  Filled 2024-06-10 (×2): qty 200

## 2024-06-10 MED ORDER — NITROGLYCERIN 0.4 MG SL SUBL: 0.4000 mg | SUBLINGUAL_TABLET | SUBLINGUAL | Status: DC | PRN

## 2024-06-10 MED ORDER — LORAZEPAM 2 MG/ML IJ SOLN
0.5000 mg | Freq: Once | INTRAMUSCULAR | Status: AC
  Administered 2024-06-10: 0.5 mg via INTRAVENOUS
  Filled 2024-06-10: qty 1

## 2024-06-10 MED ORDER — AMIODARONE LOAD VIA INFUSION
150.0000 mg | Freq: Once | INTRAVENOUS | Status: AC
  Administered 2024-06-10: 150 mg via INTRAVENOUS
  Filled 2024-06-10: qty 83.34

## 2024-06-10 MED ORDER — AMIODARONE IV BOLUS ONLY 150 MG/100ML: 150.0000 mg | Freq: Once | INTRAVENOUS | Status: DC

## 2024-06-10 MED ORDER — ONDANSETRON HCL 4 MG/2ML IJ SOLN: 4.0000 mg | Freq: Four times a day (QID) | INTRAMUSCULAR | Status: DC | PRN

## 2024-06-10 MED ORDER — LISINOPRIL 5 MG PO TABS
2.5000 mg | ORAL_TABLET | Freq: Every day | ORAL | Status: DC
  Administered 2024-06-10: 2.5 mg via ORAL
  Filled 2024-06-10: qty 1

## 2024-06-10 MED ORDER — AMIODARONE HCL IN DEXTROSE 360-4.14 MG/200ML-% IV SOLN
30.0000 mg/h | INTRAVENOUS | Status: AC
  Administered 2024-06-11 – 2024-06-12 (×4): 30 mg/h via INTRAVENOUS
  Filled 2024-06-10 (×4): qty 200

## 2024-06-10 MED ORDER — CHLORHEXIDINE GLUCONATE CLOTH 2 % EX PADS
6.0000 | MEDICATED_PAD | Freq: Every day | CUTANEOUS | Status: DC
  Administered 2024-06-11: 6 via TOPICAL

## 2024-06-10 MED ORDER — ACETAMINOPHEN 650 MG RE SUPP: 650.0000 mg | Freq: Four times a day (QID) | RECTAL | Status: DC | PRN

## 2024-06-10 MED ORDER — POTASSIUM CHLORIDE CRYS ER 20 MEQ PO TBCR
40.0000 meq | EXTENDED_RELEASE_TABLET | ORAL | Status: DC
  Administered 2024-06-10 – 2024-06-14 (×3): 40 meq via ORAL
  Filled 2024-06-10 (×3): qty 2

## 2024-06-10 MED ORDER — ENOXAPARIN SODIUM 40 MG/0.4ML IJ SOSY
40.0000 mg | PREFILLED_SYRINGE | INTRAMUSCULAR | Status: DC
  Administered 2024-06-10: 40 mg via SUBCUTANEOUS
  Filled 2024-06-10: qty 0.4

## 2024-06-10 MED ORDER — FUROSEMIDE 40 MG PO TABS
20.0000 mg | ORAL_TABLET | ORAL | Status: DC
  Administered 2024-06-10: 20 mg via ORAL
  Filled 2024-06-10: qty 1

## 2024-06-10 MED ORDER — ASPIRIN 81 MG PO TBEC
81.0000 mg | DELAYED_RELEASE_TABLET | Freq: Every day | ORAL | Status: DC
  Administered 2024-06-11 – 2024-06-14 (×4): 81 mg via ORAL
  Filled 2024-06-10 (×4): qty 1

## 2024-06-10 MED ORDER — ACETAMINOPHEN 325 MG PO TABS
650.0000 mg | ORAL_TABLET | Freq: Four times a day (QID) | ORAL | Status: DC | PRN
  Administered 2024-06-10 – 2024-06-11 (×2): 650 mg via ORAL
  Filled 2024-06-10 (×2): qty 2

## 2024-06-10 NOTE — ED Triage Notes (Signed)
 Pt reports that for the past few days she has felt bad this am pt received a phone call from Dr [employer] office and was told that her defibrillator has fired at around 09;00 today,

## 2024-06-10 NOTE — Consult Note (Signed)
 Cardiology Consultation   Patient ID: [name] MRN: [id number]; DOB: 11/01/1955  Admit date: 06/10/2024 Date of Consult: 06/10/2024  PCP:  Freddrick Johns   [facility] Providers Cardiologist:  Ozell Fell, MD  Cardiology APP:  Lelon Glendia DASEN, PA-C  Electrophysiologist:  Elspeth Sage, MD    Patient Profile: [name] [name] is a 68 y.o. female with a hx of CAD (s/p anterior MI in 2010 tx with PCI (BMS) to LAD, ischemic cardiomyopathy/HFrEF (EF 60-65% in 2020), VT/SVT s/p (ICD [employer] implanted for primary prevention 06/2010, gen change 11/2017), HLD, CKD, GERD, hx of GI bleed in 04/2020 who is being seen 06/10/2024 for the evaluation of ICD at the request of Dr. Garrick.  History of Present Illness: [name] was last seen in heartcare by EP 12/2022 with Dr. Sage for follow up. At that time, patient doing well from cardiac standpoint with normal device function and no program changing. No med changes. Continued on ASA 81 mg, Lasix  20 mg every other day, Lisinopril  2.5 mg daily, Lopressor  50 mg BID, NTG prn, KCL 40 MEQ every other day, and Crestor  40 mg daily.   Alert from ICD received for event that occurred 9/22 at 7:06 am for 13 seconds with polymorphic VT in the VF zone, HR 400, terminated with 25 J of HV therapy. Also noted 9 additional VF events, 2 terminated with 1 burst of ATP, longest duration 1 min 16 sec  with HR 231-353. Also noted 346 logged NSVT with 793 logged high rate NSVT. Patient was contacted and states she was asleep during time of shock event but noted she did not feel like herself this morning.  Encouraged ED visit.    Patient presented to ED today as requested.  EKG showed NSR, HR 84 with PVC's Normal labs include CBC, BNP 47, Mg 2.3, K 4.4 Abnormal labs include Cr 1.44 (baseline 1.4 in 2023)  On interview, patient reports she did not feel any shocks while at  home  but does feel shocks occurring now in ED. She has been out of all her meds for at least 3-4  weeks due to running out and not making follow up appointment to get refills. Patient reports developing a HA on Friday that continued to get worse through out the day the eventually developed dizziness/weakness. Denies CP, SOB, palpitations, edema, syncope. She is normally able to walk 3 dogs daily for 15-20 min but has not been able to since Friday. She smokes cigarette and vapes but stopped on Friday with plans for continued cessation. Social Drinker. Denies any drugs.   Past Medical History:  Diagnosis Date   AICD (automatic cardioverter/defibrillator) present    Anemia    Cervical disc disease    disabled police officer   Coronary artery disease    Depression    Dyslipidemia    Dysrhythmia    GERD (gastroesophageal reflux disease)    ICD (implantable cardiac defibrillator), single MDT    treated with BMS to LAD   Ischemic cardiomyopathy    post-MI LVEF less than 30%; acute AMI Rx w BMS>>LAD   Myocardial infarction [facility])    Uterine cancer [facility])    age 68, s/p partial hysterectomy    Past Surgical History:  Procedure Laterality Date   ABDOMINAL HYSTERECTOMY     BIOPSY  04/24/2020   Procedure: BIOPSY;  Surgeon: Dianna Specking, MD;  Location: [facility] ENDOSCOPY;  Service: Endoscopy;;   CERVICAL DISC SURGERY     COLONOSCOPY WITH PROPOFOL  N/A  04/24/2020   Procedure: COLONOSCOPY WITH PROPOFOL ;  Surgeon: Dianna Specking, MD;  Location: [facility] ENDOSCOPY;  Service: Endoscopy;  Laterality: N/A;   CORONARY ANGIOPLASTY WITH STENT PLACEMENT     ESOPHAGOGASTRODUODENOSCOPY (EGD) WITH PROPOFOL  N/A 04/24/2020   Procedure: ESOPHAGOGASTRODUODENOSCOPY (EGD) WITH PROPOFOL ;  Surgeon: Dianna Specking, MD;  Location: [facility] ENDOSCOPY;  Service: Endoscopy;  Laterality: N/A;   HOT HEMOSTASIS N/A 04/24/2020   Procedure: HOT HEMOSTASIS (ARGON PLASMA COAGULATION/BICAP);  Surgeon: Dianna Specking, MD;  Location: [facility] ENDOSCOPY;  Service: Endoscopy;  Laterality: N/A;   ICD GENERATOR CHANGEOUT N/A 11/17/2017   Procedure: ICD  GENERATOR CHANGEOUT;  Surgeon: Fernande Elspeth BROCKS, MD;  Location: [facility] INVASIVE CV LAB;  Service: Cardiovascular;  Laterality: N/A;   icd implanted     MASTECTOMY, PARTIAL Right    PARTIAL HYSTERECTOMY     POLYPECTOMY  04/24/2020   Procedure: POLYPECTOMY;  Surgeon: Dianna Specking, MD;  Location: [facility] ENDOSCOPY;  Service: Endoscopy;;   SUBMUCOSAL TATTOO INJECTION  04/24/2020   Procedure: SUBMUCOSAL TATTOO INJECTION;  Surgeon: Dianna Specking, MD;  Location: [facility] ENDOSCOPY;  Service: Endoscopy;;     Home Medications:  Prior to Admission medications   Medication Sig Start Date End Date Taking? Authorizing Provider  acetaminophen  (TYLENOL ) 325 MG tablet Take 650 mg by mouth every 6 (six) hours as needed for mild pain or moderate pain.    [provider]  aspirin  EC 81 MG tablet Take 1 tablet (81 mg total) by mouth daily. 08/12/20   Lelon Hamilton T, PA-C  DULoxetine  (CYMBALTA ) 60 MG capsule Take 60 mg by mouth daily.  11/25/14   [provider]  furosemide  (LASIX ) 20 MG tablet TAKE 1 TABLET(20 MG) BY MOUTH EVERY OTHER DAY 01/31/23   Lelon Hamilton T, PA-C  icosapent  Ethyl (VASCEPA ) 1 g capsule Take 2 capsules (2 g total) by mouth 2 (two) times daily. 06/02/22   Lelon Hamilton T, PA-C  levothyroxine (SYNTHROID) 25 MCG tablet Take 25 mcg by mouth daily. 07/10/22   [provider]  lisinopril  (ZESTRIL ) 2.5 MG tablet TAKE 1 TABLET(2.5 MG) BY MOUTH DAILY 01/06/23   Wonda Sharper, MD  metoprolol  tartrate (LOPRESSOR ) 50 MG tablet TAKE 1 TABLET(50 MG) BY MOUTH TWICE DAILY 11/15/22   Wonda Sharper, MD  nitroGLYCERIN  (NITROSTAT ) 0.4 MG SL tablet Place 0.4 mg under the tongue every 5 (five) minutes x 3 doses as needed for chest pain.    [provider]  pantoprazole  (PROTONIX ) 40 MG tablet TAKE 1 TABLET(40 MG) BY MOUTH DAILY 03/18/24   Wonda Sharper, MD  potassium chloride  SA (KLOR-CON  M) 20 MEQ tablet Take 2 tablets (40 mEq total) by mouth every other day. 02/08/24   Fernande Elspeth BROCKS,  MD  rosuvastatin  (CRESTOR ) 40 MG tablet TAKE 1 TABLET BY MOUTH EVERY DAY 03/12/24   Fernande Elspeth BROCKS, MD    Scheduled Meds:  Continuous Infusions:  PRN Meds:   Allergies:   No Known Allergies  Social History:   Social History   Socioeconomic History   Marital status: Divorced    Spouse name: Not on file   Number of children: 4   Years of education: 13   Highest education level: Not on file  Occupational History   Not on file  Tobacco Use   Smoking status: Former    Current packs/day: 0.00    Average packs/day: 0.5 packs/day for 20.0 years (10.0 ttl pk-yrs)    Types: Cigarettes    Start date: 89    Quit date: 2019  Years since quitting: 6.7   Smokeless tobacco: Never  Vaping Use   Vaping status: Never Used  Substance and Sexual Activity   Alcohol use: Yes    Comment: rare   Drug use: No   Sexual activity: Not on file  Other Topics Concern   Not on file  Social History Narrative   Not on file   Family History:   Family History  Problem Relation Age of Onset   Heart attack Mother    Cancer Neg Hx      ROS:  Please see the history of present illness.  All other ROS reviewed and negative.     Physical Exam/Data: Vitals:   06/10/24 1205 06/10/24 1208  BP:  (!) 150/82  Pulse:  84  Resp:  16  Temp:  98.3 F (36.8 C)  TempSrc:  Oral  SpO2:  100%  Weight: 67.6 kg   Height: 5' 4 (1.626 m)    No intake or output data in the 24 hours ending 06/10/24 1341    06/10/2024   12:05 PM 12/27/2022    1:55 PM 08/26/2022    9:10 AM  Last 3 Weights  Weight (lbs) 149 lb 149 lb 3.2 oz 143 lb 12.8 oz  Weight (kg) 67.586 kg 67.677 kg 65.227 kg     Body mass index is 25.58 kg/m.  General:  Well nourished, well developed, in no acute distress HEENT: normal Neck: no JVD Vascular: No carotid bruits; Distal pulses 2+ bilaterally Cardiac:  normal S1, S2; RRR; no murmur  Lungs:  clear to auscultation bilaterally, no wheezing, rhonchi or rales  Abd: soft, nontender,  no hepatomegaly  Ext: no edema Musculoskeletal:  No deformities, BUE and BLE strength normal and equal Skin: warm and dry  Neuro:  CNs 2-12 intact, no focal abnormalities noted Psych:  Normal affect   EKG:  The EKG was personally reviewed and demonstrates:  NSR, HR 84 with PVC's Telemetry:  Telemetry was personally reviewed and demonstrates:   NSR, HR 70-90 with multiples episodes of NSVT with longest 11 seconds, ventricular bigeminy/trigeminy  Relevant CV Studies: ECHO 2020 IMPRESSIONS   1. Left ventricular ejection fraction, by visual estimation, is 60 to  65%. The left ventricle has normal function. There is no left ventricular  hypertrophy.   2. LV endocardium is not well visualized . Technically difficult study.   3. Global right ventricle has normal systolic function.The right  ventricular size is normal. No increase in right ventricular wall  thickness.   4. Left atrial size was normal.   5. Right atrial size was normal.   6. The mitral valve is normal in structure. No evidence of mitral valve  regurgitation.   7. The tricuspid valve is normal in structure. Tricuspid valve  regurgitation is trivial.   8. The aortic valve is normal in structure. Aortic valve regurgitation is  not visualized.   9. The pulmonic valve was normal in structure. Pulmonic valve  regurgitation is not visualized.  10. Normal pulmonary artery systolic pressure.  11. The atrial septum is grossly normal.   Laboratory Data: High Sensitivity Troponin:  No results for input(s): TROPONINIHS in the last 720 hours.   ChemistryNo results for input(s): NA, K, CL, CO2, GLUCOSE, BUN, CREATININE, CALCIUM , MG, GFRNONAA, GFRAA, ANIONGAP in the last 168 hours.  No results for input(s): PROT, ALBUMIN, AST, ALT, ALKPHOS, BILITOT in the last 168 hours. Lipids No results for input(s): CHOL, TRIG, HDL, LABVLDL, LDLCALC, CHOLHDL in the last 168 hours.  HematologyNo results  for input(s): WBC, RBC, HGB, HCT, MCV, MCH, MCHC, RDW, PLT in the last 168 hours. Thyroid  No results for input(s): TSH, FREET4 in the last 168 hours.  BNPNo results for input(s): BNP, PROBNP in the last 168 hours.  DDimer No results for input(s): DDIMER in the last 168 hours.  Radiology/Studies:  No results found.   Assessment and Plan: VT/VF  ICD in place  Device check 05/23/2024: V-paced, normal function, no arrhythmia Patient received alert from ICD about VT/VF requiring 1 shock as above and 2 ATP.  Patient denied any shocks prior to ED visit.  EKG showed NSR, HR 84 with PVC's Tele: NSR, HR 70-90 with multiples episodes of NSVT with longest 11 seconds, ventricular bigeminy/trigeminy Patient states she feel getting shocked in ED. Will get device interrogation. Notified ED MD.  Will restart meds and monitor overnight to assess any changes in tele. Depending on overnight tele and number of shocks, can determine best next steps for patient.  Discussed possible transfer to [facility] but patient not interested at this time due to not having anyone to care for her dogs. Will reassess in  the am.   HFrEF  TTE 07/31/2019: EF 60-65, normal RVSF, trivial TR - *difficult acoustic windows >> reviewed by Dr. Wonda and felt to demonstrate stable mod LV dysfunction  Denies any SOB, edema, orthopnea. Appear Euvolemic on exam. No need for IV diuretics or repeat ECHO at this time.  Restart home meds: Lasix  20 mg every other day, KCL 40 MEQ every other day, Lisinopril  2.5 mg daily, Lopressor  50 mg BID    CAD  HLD Hx of anterior MI in 2010 tx with BMS to the LAD.  Denies any angina symptoms. No need for additional ischemic evaluation at this time.  Restart  home meds: ASA 81 mg once daily, Crestor  40 mg once daily, Lopressor  50 mg twice daily.   CKD Stage 3  Cr 1.44 (baseline 1.4 in 2023) Continue to monitor  For questions or updates, please contact [facility] Please consult [email or web address] for contact info under      Signed, Lorette CINDERELLA Kapur, PA-C  06/10/2024 1:41 PM

## 2024-06-10 NOTE — Discharge Instructions (Signed)

## 2024-06-10 NOTE — H&P (Addendum)
 History and Physical    [name] [name] FMW:[id number] DOB: 04/03/1956 DOA: 06/10/2024  PCP: Pcp, No   Patient coming from: Home  I have personally briefly reviewed patient's old medical records in [facility]  Chief Complaint: Abnormal Cardiac rhythm  HPI: [name] [name] is a 68 y.o. female with medical history significant for congestive heart failure with reduced EF- ICD status, CKD 3.  Patient was called this morning to come to the emergency department after her ICD delivered shocks at about 7 AM this morning for polymorphic VT.  Patient reports she was sleeping when this happened.  She was unaware of the episode and was not aware that her ICD had fired.  While here in the ED she reports an episode that felt like a shock. She reports persistent headache over the past 3 days, worst she has ever experienced, no visual deficits, no focal weakness of extremities, felt like this morning she was not herself.  Headache has improved.  She denies frequent or persistent headaches.     Patient ran out of her medications about 3 weeks ago and has not followed up for refills as her primary care provider and cardiologist have both retired -metoprolol , lisinopril , Lasix  20 mg, aspirin  and Crestor .  She reports some swelling of her upper and lower extremities that spontaneously improved-another reason why she did not follow-up for her refills as she thought she may not need these medications. She denies difficulty breathing.  No chest pain.  ED Course: Temperature 98.3.  Heart rate 67-91.  Respiratory rate 11-21.  Blood pressure systolic 108-150.  O2 sats greater 96% on room air. Potassium 4.4. Mag -2.3.  BNP 47. Cardiology was consulted, patient evaluated in ED, recommended admission here.  Review of Systems: As per HPI all other systems reviewed and negative.  Past Medical History:  Diagnosis Date   AICD (automatic cardioverter/defibrillator) present    Anemia    Cervical disc disease    disabled  police officer   Coronary artery disease    Depression    Dyslipidemia    Dysrhythmia    GERD (gastroesophageal reflux disease)    ICD (implantable cardiac defibrillator), single MDT    treated with BMS to LAD   Ischemic cardiomyopathy    post-MI LVEF less than 30%; acute AMI Rx w BMS>>LAD   Myocardial infarction [facility])    Uterine cancer [facility])    age 27, s/p partial hysterectomy    Past Surgical History:  Procedure Laterality Date   ABDOMINAL HYSTERECTOMY     BIOPSY  04/24/2020   Procedure: BIOPSY;  Surgeon: Dianna Specking, MD;  Location: [facility] ENDOSCOPY;  Service: Endoscopy;;   CERVICAL DISC SURGERY     COLONOSCOPY WITH PROPOFOL  N/A 04/24/2020   Procedure: COLONOSCOPY WITH PROPOFOL ;  Surgeon: Dianna Specking, MD;  Location: [facility] ENDOSCOPY;  Service: Endoscopy;  Laterality: N/A;   CORONARY ANGIOPLASTY WITH STENT PLACEMENT     ESOPHAGOGASTRODUODENOSCOPY (EGD) WITH PROPOFOL  N/A 04/24/2020   Procedure: ESOPHAGOGASTRODUODENOSCOPY (EGD) WITH PROPOFOL ;  Surgeon: Dianna Specking, MD;  Location: [facility] ENDOSCOPY;  Service: Endoscopy;  Laterality: N/A;   HOT HEMOSTASIS N/A 04/24/2020   Procedure: HOT HEMOSTASIS (ARGON PLASMA COAGULATION/BICAP);  Surgeon: Dianna Specking, MD;  Location: [facility] ENDOSCOPY;  Service: Endoscopy;  Laterality: N/A;   ICD GENERATOR CHANGEOUT N/A 11/17/2017   Procedure: ICD GENERATOR CHANGEOUT;  Surgeon: Fernande Elspeth BROCKS, MD;  Location: [facility] INVASIVE CV LAB;  Service: Cardiovascular;  Laterality: N/A;   icd implanted     MASTECTOMY, PARTIAL Right  PARTIAL HYSTERECTOMY     POLYPECTOMY  04/24/2020   Procedure: POLYPECTOMY;  Surgeon: Dianna Specking, MD;  Location: [facility] ENDOSCOPY;  Service: Endoscopy;;   SUBMUCOSAL TATTOO INJECTION  04/24/2020   Procedure: SUBMUCOSAL TATTOO INJECTION;  Surgeon: Dianna Specking, MD;  Location: [facility] ENDOSCOPY;  Service: Endoscopy;;     reports that she quit smoking about 6 years ago. Her smoking use included cigarettes. She started smoking about 26  years ago. She has a 10 pack-year smoking history. She has never used smokeless tobacco. She reports current alcohol use. She reports that she does not use drugs.  No Known Allergies  Family History  Problem Relation Age of Onset   Heart attack Mother    Cancer Neg Hx    Prior to Admission medications   Medication Sig Start Date End Date Taking? Authorizing Provider  acetaminophen  (TYLENOL ) 325 MG tablet Take 650 mg by mouth every 6 (six) hours as needed for mild pain or moderate pain.    [provider]  aspirin  EC 81 MG tablet Take 1 tablet (81 mg total) by mouth daily. 08/12/20   Lelon Hamilton T, PA-C  DULoxetine  (CYMBALTA ) 60 MG capsule Take 60 mg by mouth daily.  11/25/14   [provider]  furosemide  (LASIX ) 20 MG tablet TAKE 1 TABLET(20 MG) BY MOUTH EVERY OTHER DAY 01/31/23   Lelon Hamilton T, PA-C  icosapent  Ethyl (VASCEPA ) 1 g capsule Take 2 capsules (2 g total) by mouth 2 (two) times daily. 06/02/22   Lelon Hamilton T, PA-C  levothyroxine (SYNTHROID) 25 MCG tablet Take 25 mcg by mouth daily. 07/10/22   [provider]  lisinopril  (ZESTRIL ) 2.5 MG tablet TAKE 1 TABLET(2.5 MG) BY MOUTH DAILY 01/06/23   Cooper, Michael, MD  metoprolol  tartrate (LOPRESSOR ) 50 MG tablet TAKE 1 TABLET(50 MG) BY MOUTH TWICE DAILY 11/15/22   Wonda Sharper, MD  nitroGLYCERIN  (NITROSTAT ) 0.4 MG SL tablet Place 0.4 mg under the tongue every 5 (five) minutes x 3 doses as needed for chest pain.    [provider]  pantoprazole  (PROTONIX ) 40 MG tablet TAKE 1 TABLET(40 MG) BY MOUTH DAILY 03/18/24   Wonda Sharper, MD  potassium chloride  SA (KLOR-CON  M) 20 MEQ tablet Take 2 tablets (40 mEq total) by mouth every other day. 02/08/24   Fernande Elspeth BROCKS, MD  rosuvastatin  (CRESTOR ) 40 MG tablet TAKE 1 TABLET BY MOUTH EVERY DAY 03/12/24   Fernande Elspeth BROCKS, MD    Physical Exam: Vitals:   06/10/24 1205 06/10/24 1208 06/10/24 1415  BP:  (!) 150/82 138/81  Pulse:  84 83  Resp:  16 16  Temp:   98.3 F (36.8 C)   TempSrc:  Oral   SpO2:  100% 98%  Weight: 67.6 kg    Height: 5' 4 (1.626 m)      Constitutional: NAD, calm, comfortable Vitals:   06/10/24 1205 06/10/24 1208 06/10/24 1415  BP:  (!) 150/82 138/81  Pulse:  84 83  Resp:  16 16  Temp:  98.3 F (36.8 C)   TempSrc:  Oral   SpO2:  100% 98%  Weight: 67.6 kg    Height: 5' 4 (1.626 m)     Eyes: PERRL, lids and conjunctivae normal ENMT: Mucous membranes are moist. Posterior pharynx clear of any exudate or lesions. Respiratory: clear to auscultation bilaterally, no wheezing, no crackles. Normal respiratory effort. No accessory muscle use.  Cardiovascular: Frequent nonsustained V. tach on telemetry monitor, otherwise regular rate and rhythm, no murmurs / rubs /  gallops. No extremity edema.  Extremities warm Abdomen: no tenderness, no masses palpated. No hepatosplenomegaly. Bowel sounds positive.  Musculoskeletal: no clubbing / cyanosis. No joint deformity upper and lower extremities. Skin: no rashes, lesions, ulcers. No induration Neurologic: No facial asymmetry, moving extremity spontaneously, speech fluent. Psychiatric: Normal judgment and insight. Alert and oriented x 3. Normal mood.   Labs on Admission: I have personally reviewed following labs and imaging studies  CBC: Recent Labs  Lab 06/10/24 1330  WBC 7.2  NEUTROABS 4.4  HGB 13.3  HCT 41.9  MCV 91.5  PLT 174   Basic Metabolic Panel: Recent Labs  Lab 06/10/24 1330  NA 137  K 4.4  CL 104  CO2 21*  GLUCOSE 98  BUN 21  CREATININE 1.44*  CALCIUM  9.7  MG 2.3   GFR: Estimated Creatinine Clearance: 36.3 mL/min (A) (by C-G formula based on SCr of 1.44 mg/dL (H)). Liver Function Tests: Recent Labs  Lab 06/10/24 1330  AST 38  ALT 43  ALKPHOS 135*  BILITOT 0.7  PROT 7.9  ALBUMIN 4.2   Radiological Exams on Admission: No results found.  EKG: Independently reviewed.  EKG shows sinus rhythm, PVCs present, some EKG with artefacts  present.  Assessment/Plan Principal Problem:   Polymorphic ventricular tachycardia (HCC) Active Problems:   CAD (coronary artery disease)   HFrEF (heart failure with reduced ejection fraction) (HCC)   Dual implantable cardioverter-defibrillator in situ   Stage 3b chronic kidney disease (HCC)   Assessment and Plan: No notes have been filed under this hospital service. Service: Hospitalist  Ventricular tachycardia- asymptomatic with shock delivered via her ICD.  Contacted to come to the ED.  She reports another shock here in the ED.  Ran out of her medications- lisinopril , metoprolol , vascepa , Lasix  20 mg, aspirin  and Crestor  about 3 weeks ago. Echo 2020 EF of 60 to 65%. - EDP talked to cardiology, will see in consult in a.m., echocardiogram, resume home medications, will interrogate device to see if patient actually did have an additional shock while in the ED, UDS. - Obtain chest x-ray-no acute abnormality - Addendum- patient now symptomatic with dizziness with multiple episodes of nonsustained V. Tach- ~ 9 beats NSVT caught on EKG.   - Confirmed via remote interrogation that patient has now had 5 shocks today.  I spoke to cardiology fellow on-call at [facility]- Dr. Otelia, agreed with starting amiodarone -bolus 150mg  + drip.  If persistent V. tach despite amiodarone , then transfer to [facility].  Also consider giving magnesium  if persistent polymorphic V. tach. - Trop X 2 - Obtain head CT for persistent headache  Coronary artery disease- status post previous anterior wall infarct in 2010 treated with BMS to the LAD.   Ischemic cardiomyopathy, last echo 2020 EF of 60 to 65%.  Appears euvolemic, BNP 47.  No peripheral signs of edema.  CKD 3 -creatinine 1.44.  Stable.  DVT prophylaxis: Lovenox  Code Status: FULL Family Communication: None at bedside Disposition Plan: ~ 2 days Consults called: Cardiology Admission status:  Inpt Stepdown I certify that at the point of admission it is my  clinical judgment that the patient will require inpatient hospital care spanning beyond 2 midnights from the point of admission due to high intensity of service, high risk for further deterioration and high frequency of surveillance required.  CRITICAL CARE Performed by: Tully FORBES Carwin   Total critical care time: 70 minutes  Critical care time was exclusive of separately billable procedures and treating other patients.  Critical  care was necessary to treat or prevent imminent or life-threatening deterioration.  Critical care was time spent personally by me on the following activities: development of treatment plan with patient and/or surrogate as well as nursing, discussions with consultants, evaluation of patient's response to treatment, examination of patient, obtaining history from patient or surrogate, ordering and performing treatments and interventions, ordering and review of laboratory studies, ordering and review of radiographic studies, pulse oximetry and re-evaluation of patient's condition.    Author: Tully FORBES Carwin, MD 06/10/2024 10:50 PM  For on call review [email or web address].

## 2024-06-10 NOTE — ED Provider Notes (Signed)
 [facility] DE[facility]MENT AT [facility] Provider Note   CSN: [id number] Arrival date & time: 06/10/24  1129     Patient presents with: AICD Problem (Pt reports that for the past few days she has felt bad this am pt received a phone call from Dr Margurite office and was told that her defibrillator has fired at around 09;00 today, )   [name] [name] is a 68 y.o. female.   HPI Patient presents with headache. More notably, the patient was informed today from her medical device rep that her implanted defibrillator discharged.  She notes that over the past 3 days she has had headache, without focal neuro complaints, with relief with Tylenol .  She has been feeling weaker than usual over the past few days as well.  She ran out of her medications about 1 month ago, has not been able to obtain refills. Chart review notable for device rep report with discharge due to V-fib earlier in the day.    Prior to Admission medications   Medication Sig Start Date End Date Taking? Authorizing Provider  acetaminophen  (TYLENOL ) 325 MG tablet Take 650 mg by mouth every 6 (six) hours as needed for mild pain or moderate pain.    [provider]  aspirin  EC 81 MG tablet Take 1 tablet (81 mg total) by mouth daily. 08/12/20   Lelon Hamilton T, PA-C  DULoxetine  (CYMBALTA ) 60 MG capsule Take 60 mg by mouth daily.  11/25/14   [provider]  furosemide  (LASIX ) 20 MG tablet TAKE 1 TABLET(20 MG) BY MOUTH EVERY OTHER DAY 01/31/23   Lelon Hamilton T, PA-C  icosapent  Ethyl (VASCEPA ) 1 g capsule Take 2 capsules (2 g total) by mouth 2 (two) times daily. 06/02/22   Lelon Hamilton T, PA-C  levothyroxine (SYNTHROID) 25 MCG tablet Take 25 mcg by mouth daily. 07/10/22   [provider]  lisinopril  (ZESTRIL ) 2.5 MG tablet TAKE 1 TABLET(2.5 MG) BY MOUTH DAILY 01/06/23   Wonda Sharper, MD  metoprolol  tartrate (LOPRESSOR ) 50 MG tablet TAKE 1 TABLET(50 MG) BY MOUTH TWICE DAILY 11/15/22   Wonda Sharper, MD  nitroGLYCERIN  (NITROSTAT ) 0.4 MG SL tablet Place 0.4 mg under the tongue every 5 (five) minutes x 3 doses as needed for chest pain.    [provider]  pantoprazole  (PROTONIX ) 40 MG tablet TAKE 1 TABLET(40 MG) BY MOUTH DAILY 03/18/24   Wonda Sharper, MD  potassium chloride  SA (KLOR-CON  M) 20 MEQ tablet Take 2 tablets (40 mEq total) by mouth every other day. 02/08/24   Fernande Elspeth BROCKS, MD  rosuvastatin  (CRESTOR ) 40 MG tablet TAKE 1 TABLET BY MOUTH EVERY DAY 03/12/24   Fernande Elspeth BROCKS, MD    Allergies: Patient has no known allergies.    Review of Systems  Updated Vital Signs BP 138/81   Pulse 83   Temp 98.3 F (36.8 C) (Oral)   Resp 16   Ht 1.626 m (5' 4)   Wt 67.6 kg   SpO2 98%   BMI 25.58 kg/m   Physical Exam Vitals and nursing note reviewed.  Constitutional:      General: She is not in acute distress.    Appearance: She is well-developed.  HENT:     Head: Normocephalic and atraumatic.  Eyes:     Conjunctiva/sclera: Conjunctivae normal.  Cardiovascular:     Rate and Rhythm: Normal rate and regular rhythm.  Pulmonary:     Effort: Pulmonary effort is normal. No respiratory distress.  Breath sounds: No stridor.  Abdominal:     General: There is no distension.  Skin:    General: Skin is warm and dry.  Neurological:     Mental Status: She is alert and oriented to person, place, and time.     Cranial Nerves: No cranial nerve deficit.  Psychiatric:        Mood and Affect: Mood normal.     (all labs ordered are listed, but only abnormal results are displayed) Labs Reviewed  COMPREHENSIVE METABOLIC PANEL WITH GFR - Abnormal; Notable for the following components:      Result Value   CO2 21 (*)    Creatinine, Ser 1.44 (*)    Alkaline Phosphatase 135 (*)    GFR, Estimated 40 (*)    All other components within normal limits  CBC WITH DIFFERENTIAL/PLATELET  BRAIN NATRIURETIC PEPTIDE  MAGNESIUM     EKG: EKG Interpretation Date/Time:  Monday  June 10 2024 14:10:59 EDT Ventricular Rate:  106 PR Interval:  164 QRS Duration:  119 QT Interval:  341 QTC Calculation: 451 R Axis:   61  Text Interpretation: likely sinus tachycardia, though w substantial artefact Confirmed by Garrick Charleston [phone]) on 06/10/2024 2:30:11 PM  Radiology: No results found.   Procedures   Medications Ordered in the ED  aspirin  EC tablet 81 mg (has no administration in time range)  furosemide  (LASIX ) tablet 20 mg (has no administration in time range)  lisinopril  (ZESTRIL ) tablet 2.5 mg (has no administration in time range)  metoprolol  tartrate (LOPRESSOR ) tablet 50 mg (has no administration in time range)  nitroGLYCERIN  (NITROSTAT ) SL tablet 0.4 mg (has no administration in time range)  potassium chloride  SA (KLOR-CON  M) CR tablet 40 mEq (has no administration in time range)  rosuvastatin  (CRESTOR ) tablet 40 mg (has no administration in time range)                                    Medical Decision Making Patient presents after discharged ICD, with ongoing weakness, headache after endorsing lack of medication access. Concern for event like medications contributing to arrhythmia, versus other Electra abnormalities, versus need for reprogramming of her device. Case discussed with cardiology, labs sent, continuous monitoring commenced. Cardiac 85 sinus normal pulse ox 100% room air normal  Amount and/or Complexity of Data Reviewed External Data Reviewed: notes. Labs: ordered. Decision-making details documented in ED Course. ECG/medicine tests: ordered and independent interpretation performed. Decision-making details documented in ED Course.  Risk Prescription drug management. Decision regarding hospitalization. Diagnosis or treatment significantly limited by social determinants of health.  Update: I discussed patient's case with our cardiology colleagues. Review of the patient's interpretation of defibrillator function earlier today  indicates at least 1 episode of V-fib triggering shock, several other subthreshold episodes.  3:11 PM Patient with continued episodes of V. tach, 1 possible discharge of her defibrillator.  She has remained awake and alert, however. Initial labs reassuring, though largely noncontributory. With consideration of the patient's cessation of her medications, this may be contributing to her increasing frequency of episodic arrhythmia.  After discussion with cardiology, patient will be admitted to our hospitalist team with cardiology following as a consulting service for anticipated additional evaluation of her device.  Home meds restarted after discussed with cardiology.    CRITICAL CARE Performed by: Charleston Garrick Total critical care time: 35 minutes Critical care time was exclusive of separately billable procedures and treating other  patients. Critical care was necessary to treat or prevent imminent or life-threatening deterioration. Critical care was time spent personally by me on the following activities: development of treatment plan with patient and/or surrogate as well as nursing, discussions with consultants, evaluation of patient's response to treatment, examination of patient, obtaining history from patient or surrogate, ordering and performing treatments and interventions, ordering and review of laboratory studies, ordering and review of radiographic studies, pulse oximetry and re-evaluation of patient's condition.   Final diagnoses:  Ventricular fibrillation [facility])     Garrick Charleston, MD 06/10/24 [phone]

## 2024-06-10 NOTE — Telephone Encounter (Signed)
 High alert received from [employer]:  Alert remote transmission:  1 shocks delivered for episode #1447. Event occurred 9/22 @ 07:06, duration 13sec, HR 400, EGM c/w polymorphic VT, in the VF zone, terminated with 25J of HV therapy - route to triage high alert per protocol 9 additional VF events, 2 terminated with 1 burst of ATP, longest duration 16sec, HR's 231-353 346 logged NSVT, 793 logged High rate NSVT, increase in daily HR's per trends HF diagnostics currenlty abnormal Shock alert was reset on [facility]. Follow up as scheduled. LA, CVRS _______________________________________________________________________________  [place] and spoke with patient to assess for symptoms and notify of driving restrictions  Patient stated she was asleep during time of shock event,  but did say that she does not feel like herself, this morning  Patient strongly encouraged to find a ride to the ED or call 911   Patient stated she will try to find a ride from a friend but it may take several hours  This RN offered to call emergency services for the patient, but patient said she would do it if she couldn't find a ride  Patient admitted that she ran out of her prescribed medications and hasn't taken her Lopressor  in at least 2 weeks  6 month driving restrictions reviewed with patient and she verbalized understanding   EP APP in hospital notified of patient hopeful arrival in next few hours and is aware  Routing to [facility], MD for awareness

## 2024-06-10 NOTE — Progress Notes (Signed)
 Patient family at bedside started yelling for the nurse when this RN and Zachary Horn, RN arrived to bedside patent appeared disoriented. After a few seconds patient started to become alert and verified that she received a shock from her ICD. Notified Dr. Pearlean. See new orders. Remote ICD check done w/ hospital equipment. Educated pt and family that pt heart is going into a shock-able rhythm and that is why her ICD is shocking her.

## 2024-06-10 NOTE — Plan of Care (Signed)
   Problem: Education: Goal: Knowledge of General Education information will improve Description Including pain rating scale, medication(s)/side effects and non-pharmacologic comfort measures Outcome: Progressing

## 2024-06-11 ENCOUNTER — Inpatient Hospital Stay (HOSPITAL_COMMUNITY)

## 2024-06-11 ENCOUNTER — Encounter (HOSPITAL_COMMUNITY): Payer: Self-pay | Admitting: Internal Medicine

## 2024-06-11 ENCOUNTER — Other Ambulatory Visit (HOSPITAL_COMMUNITY): Payer: Self-pay | Admitting: *Deleted

## 2024-06-11 DIAGNOSIS — I4729 Other ventricular tachycardia: Secondary | ICD-10-CM

## 2024-06-11 DIAGNOSIS — I5030 Unspecified diastolic (congestive) heart failure: Secondary | ICD-10-CM | POA: Diagnosis not present

## 2024-06-11 DIAGNOSIS — I2510 Atherosclerotic heart disease of native coronary artery without angina pectoris: Secondary | ICD-10-CM | POA: Diagnosis not present

## 2024-06-11 DIAGNOSIS — N1832 Chronic kidney disease, stage 3b: Secondary | ICD-10-CM | POA: Diagnosis not present

## 2024-06-11 DIAGNOSIS — I959 Hypotension, unspecified: Secondary | ICD-10-CM

## 2024-06-11 DIAGNOSIS — I255 Ischemic cardiomyopathy: Secondary | ICD-10-CM | POA: Diagnosis not present

## 2024-06-11 LAB — RAPID URINE DRUG SCREEN, HOSP PERFORMED
Amphetamines: NOT DETECTED
Barbiturates: NOT DETECTED
Benzodiazepines: NOT DETECTED
Cocaine: NOT DETECTED
Opiates: NOT DETECTED
Tetrahydrocannabinol: NOT DETECTED

## 2024-06-11 LAB — BASIC METABOLIC PANEL WITH GFR
Anion gap: 11 (ref 5–15)
BUN: 27 mg/dL — ABNORMAL HIGH (ref 8–23)
CO2: 21 mmol/L — ABNORMAL LOW (ref 22–32)
Calcium: 9.3 mg/dL (ref 8.9–10.3)
Chloride: 106 mmol/L (ref 98–111)
Creatinine, Ser: 1.55 mg/dL — ABNORMAL HIGH (ref 0.44–1.00)
GFR, Estimated: 37 mL/min — ABNORMAL LOW (ref >60)
Glucose, Bld: 100 mg/dL — ABNORMAL HIGH (ref 70–99)
Potassium: 3.8 mmol/L (ref 3.5–5.1)
Sodium: 138 mmol/L (ref 135–145)

## 2024-06-11 LAB — CBC
HCT: 41.3 % (ref 36.0–46.0)
Hemoglobin: 13.3 g/dL (ref 12.0–15.0)
MCH: 29.4 pg (ref 26.0–34.0)
MCHC: 32.2 g/dL (ref 30.0–36.0)
MCV: 91.2 fL (ref 80.0–100.0)
Platelets: 187 K/uL (ref 150–400)
RBC: 4.53 MIL/uL (ref 3.87–5.11)
RDW: 14.2 % (ref 11.5–15.5)
WBC: 6.9 K/uL (ref 4.0–10.5)
nRBC: 0 % (ref 0.0–0.2)

## 2024-06-11 LAB — HIV ANTIBODY (ROUTINE TESTING W REFLEX): HIV Screen 4th Generation wRfx: NONREACTIVE

## 2024-06-11 LAB — MAGNESIUM: Magnesium: 2.2 mg/dL (ref 1.7–2.4)

## 2024-06-11 LAB — ECHOCARDIOGRAM COMPLETE
Area-P 1/2: 2.73 cm2
Height: 64 in
S' Lateral: 3.2 cm
Weight: 2328.06 [oz_av]

## 2024-06-11 LAB — TROPONIN I (HIGH SENSITIVITY): Troponin I (High Sensitivity): 13 ng/L (ref <18)

## 2024-06-11 MED ORDER — MAGNESIUM SULFATE 2 GM/50ML IV SOLN
2.0000 g | Freq: Once | INTRAVENOUS | Status: AC
  Administered 2024-06-11: 2 g via INTRAVENOUS
  Filled 2024-06-11: qty 50

## 2024-06-11 MED ORDER — POTASSIUM CHLORIDE CRYS ER 20 MEQ PO TBCR
20.0000 meq | EXTENDED_RELEASE_TABLET | Freq: Once | ORAL | Status: AC
Start: 2024-06-11 — End: 2024-06-11
  Administered 2024-06-11: 20 meq via ORAL
  Filled 2024-06-11: qty 1

## 2024-06-11 MED ORDER — PERFLUTREN LIPID MICROSPHERE
1.0000 mL | INTRAVENOUS | Status: AC | PRN
  Administered 2024-06-11: 3 mL via INTRAVENOUS

## 2024-06-11 MED ORDER — LISINOPRIL 5 MG PO TABS
2.5000 mg | ORAL_TABLET | Freq: Every day | ORAL | Status: DC
Start: 2024-06-12 — End: 2024-06-11

## 2024-06-11 MED ORDER — LACTATED RINGERS IV BOLUS
1000.0000 mL | Freq: Once | INTRAVENOUS | Status: AC
  Administered 2024-06-11: 1000 mL via INTRAVENOUS

## 2024-06-11 NOTE — Consult Note (Signed)
 ELECTROPHYSIOLOGY CONSULT NOTE    Patient ID: [name] [name] MRN: [id number], DOB/AGE: 66/30/1957 66 y.o.  Admit date: 06/10/2024 Date of Consult: 06/11/2024  Primary Physician: Freddrick Johns Primary Cardiologist: Ozell Fell, MD  Electrophysiologist: {ZEFID:[id number]::ZE Team}   Referring Provider: @ATTENDING @  Patient Profile: [name] [name] is a 68 y.o. female with a history of CAD (s/p anterior MI in 2010 tx with PCI (BMS) to LAD, ischemic cardiomyopathy/HFrEF (EF 60-65% in 2020), VT/SVT s/p (ICD Medtronic implanted for primary prevention 06/2010, gen change 11/2017), HLD, CKD, GERD, hx of GI bleed in 04/2020 who is being seen today for the evaluation of ICD shocks at the request of Dr. Debera.  HPI:  [name] [name] is a 68 y.o. female with above noted medical history. Patient advised to present to the ED on 9/22 after remote report from her device indicated 13 seconds with polymorphic VT in the VF zone, HR 400, terminated with 25 J of HV therapy. Also noted 9 additional VF events, 2 terminated with 1 burst of ATP, longest duration 1 min 16 sec with HR 231-353. Also noted 346 logged NSVT with 793 logged high rate NSVT. Patient reportedly asleep at the time of 7:06 am shock. Patient shocked again 7:52am. She was contacted by device clinic and advised to present to the ED. Patient without cardiac GDMT for at least 3-4 weeks. Over the weekend became increasingly fatigued with dizziness and weakness. In the ED, troponin negative x2. No significant electrolyte derangement. Following admission, device reports shows at least 5 additional ICD shocks on 9/22 and patient was started on IV amiodarone  on evening of 9/22. Telemetry at [facility] showed recurrent bursts of polymorphic VT as well. Patient transferred to [facility] for further evaluation and management.   {He/she (caps):30048} denies chest pain, palpitations, dyspnea, PND, orthopnea, nausea, vomiting, dizziness, syncope, edema, weight gain, or early  satiety.   Labs Potassium3.8 (09/23 0454) Magnesium   2.2 (09/23 0454) Creatinine, ser  1.55* (09/23 0454) PLT  187 (09/23 0454) HGB  13.3 (09/23 0454) WBC 6.9 (09/23 0454) Troponin I (High Sensitivity)13 (09/22 2326).    Past Medical History:  Diagnosis Date   AICD (automatic cardioverter/defibrillator) present    Anemia    Cervical disc disease    disabled police officer   Coronary artery disease    Depression    Dyslipidemia    Dysrhythmia    GERD (gastroesophageal reflux disease)    ICD (implantable cardiac defibrillator), single MDT    treated with BMS to LAD   Ischemic cardiomyopathy    post-MI LVEF less than 30%; acute AMI Rx w BMS>>LAD   Myocardial infarction [facility])    Uterine cancer [facility])    age 68, s/p partial hysterectomy     Surgical History:  Past Surgical History:  Procedure Laterality Date   ABDOMINAL HYSTERECTOMY     BIOPSY  04/24/2020   Procedure: BIOPSY;  Surgeon: Dianna Specking, MD;  Location: [facility] ENDOSCOPY;  Service: Endoscopy;;   CERVICAL DISC SURGERY     COLONOSCOPY WITH PROPOFOL  N/A 04/24/2020   Procedure: COLONOSCOPY WITH PROPOFOL ;  Surgeon: Dianna Specking, MD;  Location: [facility] ENDOSCOPY;  Service: Endoscopy;  Laterality: N/A;   CORONARY ANGIOPLASTY WITH STENT PLACEMENT     ESOPHAGOGASTRODUODENOSCOPY (EGD) WITH PROPOFOL  N/A 04/24/2020   Procedure: ESOPHAGOGASTRODUODENOSCOPY (EGD) WITH PROPOFOL ;  Surgeon: Dianna Specking, MD;  Location: [facility] ENDOSCOPY;  Service: Endoscopy;  Laterality: N/A;   HOT HEMOSTASIS N/A 04/24/2020   Procedure: HOT HEMOSTASIS (ARGON PLASMA COAGULATION/BICAP);  Surgeon: Dianna Specking,  MD;  Location: [facility] ENDOSCOPY;  Service: Endoscopy;  Laterality: N/A;   ICD GENERATOR CHANGEOUT N/A 11/17/2017   Procedure: ICD GENERATOR CHANGEOUT;  Surgeon: Fernande Elspeth BROCKS, MD;  Location: [facility] INVASIVE CV LAB;  Service: Cardiovascular;  Laterality: N/A;   icd implanted     MASTECTOMY, PARTIAL Right    PARTIAL HYSTERECTOMY     POLYPECTOMY  04/24/2020    Procedure: POLYPECTOMY;  Surgeon: Dianna Specking, MD;  Location: [facility] ENDOSCOPY;  Service: Endoscopy;;   SUBMUCOSAL TATTOO INJECTION  04/24/2020   Procedure: SUBMUCOSAL TATTOO INJECTION;  Surgeon: Dianna Specking, MD;  Location: [facility] ENDOSCOPY;  Service: Endoscopy;;     Medications Prior to Admission  Medication Sig Dispense Refill Last Dose/Taking   acetaminophen  (TYLENOL ) 325 MG tablet Take 650 mg by mouth every 6 (six) hours as needed for mild pain or moderate pain.   Past Week   aspirin  EC 81 MG tablet Take 1 tablet (81 mg total) by mouth daily. 30 tablet 11 Past Week   levothyroxine (SYNTHROID) 25 MCG tablet Take 25 mcg by mouth daily.   Past Month   DULoxetine  (CYMBALTA ) 60 MG capsule Take 60 mg by mouth daily.  (Patient not taking: Reported on 06/10/2024)  3 Not Taking   metoprolol  tartrate (LOPRESSOR ) 50 MG tablet TAKE 1 TABLET(50 MG) BY MOUTH TWICE DAILY (Patient not taking: Reported on 06/10/2024) 180 tablet 3 Not Taking   nitroGLYCERIN  (NITROSTAT ) 0.4 MG SL tablet Place 0.4 mg under the tongue every 5 (five) minutes x 3 doses as needed for chest pain. (Patient not taking: Reported on 06/10/2024)   Not Taking   pantoprazole  (PROTONIX ) 40 MG tablet TAKE 1 TABLET(40 MG) BY MOUTH DAILY (Patient not taking: Reported on 06/10/2024) 15 tablet 0 Not Taking   rosuvastatin  (CRESTOR ) 40 MG tablet TAKE 1 TABLET BY MOUTH EVERY DAY (Patient not taking: Reported on 06/10/2024) 15 tablet 0 Not Taking    Inpatient Medications:   aspirin  EC  81 mg Oral Daily   Chlorhexidine  Gluconate Cloth  6 each Topical Q0600   enoxaparin  (LOVENOX ) injection  40 mg Subcutaneous Q24H   metoprolol  tartrate  50 mg Oral BID   potassium chloride  SA  40 mEq Oral QODAY   rosuvastatin   40 mg Oral Daily    Allergies: No Known Allergies  Family History  Problem Relation Age of Onset   Heart attack Mother    Cancer Neg Hx      Physical Exam: Vitals:   06/11/24 0800 06/11/24 0815 06/11/24 0845 06/11/24 0915  BP: (!)  109/52 118/67 103/78 (!) 88/52  Pulse: 71  65 (!) 55  Resp: 18 18 16 16   Temp:      TempSrc:      SpO2: 100% 90% 99% 99%  Weight:      Height:        GEN- NAD, A&O x 3, normal affect HEENT: Normocephalic, atraumatic Lungs- CTAB, Normal effort.  Heart- {Blank single:19197::Regular,Irregularly irregular} rate and rhythm, No M/G/R.  GI- Soft, NT, ND.  Extremities- No clubbing, cyanosis, or edema   Radiology/Studies: CT HEAD WO CONTRAST ( ) Result Date: 06/11/2024 CLINICAL DATA:  Persistent headache- 3 days. EXAM: CT HEAD WITHOUT CONTRAST TECHNIQUE: Contiguous axial images were obtained from the base of the skull through the vertex without intravenous contrast. RADIATION DOSE REDUCTION: This exam was performed according to the departmental dose-optimization program which includes automated exposure control, adjustment of the mA and/or kV according to patient size and/or use of iterative reconstruction technique. COMPARISON:  CT  head Jan 31, 2004 FINDINGS: Brain: No evidence of acute infarction, hemorrhage, hydrocephalus, extra-axial collection or mass lesion/mass effect. Vascular: No hyperdense vessel. Skull: No acute fracture. Sinuses/Orbits: Clear sinuses.  No acute orbital findings. Other: No mastoid effusions. IMPRESSION: No evidence of acute intracranial abnormality. Electronically Signed   By: Gilmore GORMAN Molt M.D.   On: 06/11/2024 03:34   DG CHEST PORT 1 VIEW Result Date: 06/10/2024 EXAM: 1 VIEW(S) XRAY OF THE CHEST 06/10/2024 04:41:00 PM COMPARISON: None available. CLINICAL HISTORY: Patient presents with headache. More notably, the patient was informed today from her medical device rep that her implanted defibrillator discharged. She notes that over the past 3 days she has had headache, without focal neuro complaints, with relief with Tylenol . She has been feeling weaker than usual over the past few days as well. FINDINGS: LUNGS AND PLEURA: No focal pulmonary opacity. No pulmonary  edema. No pleural effusion. No pneumothorax. HEART AND MEDIASTINUM: Left chest wall single lead cardiac pacemaker noted. Atherosclerotic plaque noted. BONES AND SOFT TISSUES: Partially visualized ACDF hardware noted. No acute osseous abnormality. IMPRESSION: 1. No acute findings. Electronically signed by: Dorethia Molt MD 06/10/2024 08:31 PM EDT RP Workstation: [id number]   CUP PACEART REMOTE DEVICE CHECK Result Date: 05/23/2024 ICD Scheduled remote reviewed. Normal device function.  Presenting rhythm:  Regular VS Some VF/FVT/VT therapies have 1 or more AX>B pathways programmed. AX>B pathway is not recommended for this device. 2 NSVT, HR's 195-213, each 2sec in duration Next remote 91 days. LA, CVRS   EKG:9/22 ECG with bursts of PMVT and PVCs (personally reviewed)  TELEMETRY: *** (personally reviewed)  DEVICE HISTORY: Medtronic single chamber ICD, implanted 09/28/2009 .  Assessment/Plan:  Ischemic cardiomyopathy (recovered EF) Polymorphic VT Recurrent ICD shocks Multiple rounds of ATP and at least 7 shocks for PMVT/VF in patient with ischemic cardiomyopathy, recent medication non-compliance.    {Are we signing off today?:[id number]} For questions or updates, please contact [facility] Please consult [email or web address] for contact info under     Signed, Artist Pouch, PA-C  06/11/2024, 10:22 AM

## 2024-06-11 NOTE — Progress Notes (Signed)
*  PRELIMINARY RESULTS* Echocardiogram 2D Echocardiogram has been performed with Definity .  [name] [name] 06/11/2024, 1:15 PM

## 2024-06-11 NOTE — TOC CM/SW Note (Signed)
 Transition of Care [facility]) - Inpatient Brief Assessment   Patient Details  Name: [name] MRN: [id number] Date of Birth: 1956/08/29  Transition of Care [facility]) CM/SW Contact:    Lucie Lunger, LCSWA Phone Number: 06/11/2024, 9:07 AM   Clinical Narrative: Transition of Care Department [facility]) has reviewed patient and no TOC needs have been identified at this time. We will continue to monitor patient advancement through interdiciplinary progression rounds. If new patient transition needs arise, please place a TOC consult.  Transition of Care Asessment: Insurance and Status: Insurance coverage has been reviewed Patient has primary care physician: No (PCP list added) Home environment has been reviewed: From home Prior level of function:: Independent Prior/Current Home Services: No current home services Social Drivers of Health Review: SDOH reviewed no interventions necessary Readmission risk has been reviewed: Yes Transition of care needs: no transition of care needs at this time

## 2024-06-11 NOTE — Progress Notes (Signed)
 Rounding Note   Patient Name: [name] [name] Date of Encounter: 06/11/2024  [facility] Cardiologist: Ozell Fell, MD   Subjective Patient reports significant improvement throughout the night.  Reports dizziness this morning after getting out of the bed and walking to recliner that has resolved.  She denies feeling any shocks overnight or this morning.  Patient is willing to be transferred to [facility] if necessary.  Scheduled Meds:  aspirin  EC  81 mg Oral Daily   Chlorhexidine  Gluconate Cloth  6 each Topical Q0600   enoxaparin  (LOVENOX ) injection  40 mg Subcutaneous Q24H   furosemide   20 mg Oral QODAY   [START ON 06/12/2024] lisinopril   2.5 mg Oral Daily   metoprolol  tartrate  50 mg Oral BID   potassium chloride  SA  40 mEq Oral QODAY   rosuvastatin   40 mg Oral Daily   Continuous Infusions:  amiodarone  30 mg/hr (06/11/24 0736)   magnesium  sulfate bolus IVPB 2 g (06/11/24 0826)   PRN Meds: acetaminophen  **OR** acetaminophen , nitroGLYCERIN , ondansetron  **OR** ondansetron  (ZOFRAN ) IV, polyethylene glycol, traMADol    Vital Signs  Vitals:   06/11/24 0700 06/11/24 0720 06/11/24 0736 06/11/24 0800  BP: (!) 93/56  111/60 (!) 109/52  Pulse:   71 71  Resp: 20  14 18   Temp:  97.8 F (36.6 C)    TempSrc:  Oral    SpO2: 97%  99% 100%  Weight:      Height:        Intake/Output Summary (Last 24 hours) at 06/11/2024 0849 Last data filed at 06/11/2024 0826 Gross per 24 hour  Intake 464.11 ml  Output 550 ml  Net -85.89 ml      06/10/2024    4:32 PM 06/10/2024   12:05 PM 12/27/2022    1:55 PM  Last 3 Weights  Weight (lbs) 145 lb 8.1 oz 149 lb 149 lb 3.2 oz  Weight (kg) 66 kg 67.586 kg 67.677 kg      Telemetry NSR, mainly HR 70-100 with multiples episodes of NSVT with longest 9 seconds, ventricular bigeminy/trigeminy - Personally Reviewed   Physical Exam GEN: No acute distress.  Accompanied by daughter.  Neck: No JVD Cardiac: RRR, no murmurs, rubs, or gallops.   Respiratory: Clear to auscultation bilaterally. GI: Soft, nontender, non-distended  MS: No edema; No deformity. Neuro:  Nonfocal  Psych: Normal affect   Labs High Sensitivity Troponin:   Recent Labs  Lab 06/10/24 2112 06/10/24 2326  TROPONINIHS 13 13     Chemistry Recent Labs  Lab 06/10/24 1330 06/11/24 0454  NA 137 138  K 4.4 3.8  CL 104 106  CO2 21* 21*  GLUCOSE 98 100*  BUN 21 27*  CREATININE 1.44* 1.55*  CALCIUM  9.7 9.3  MG 2.3 2.2  PROT 7.9  --   ALBUMIN 4.2  --   AST 38  --   ALT 43  --   ALKPHOS 135*  --   BILITOT 0.7  --   GFRNONAA 40* 37*  ANIONGAP 12 11    Lipids No results for input(s): CHOL, TRIG, HDL, LABVLDL, LDLCALC, CHOLHDL in the last 168 hours.  Hematology Recent Labs  Lab 06/10/24 1330 06/11/24 0454  WBC 7.2 6.9  RBC 4.58 4.53  HGB 13.3 13.3  HCT 41.9 41.3  MCV 91.5 91.2  MCH 29.0 29.4  MCHC 31.7 32.2  RDW 14.3 14.2  PLT 174 187   Thyroid  No results for input(s): TSH, FREET4 in the last 168 hours.  BNP Recent  Labs  Lab 06/10/24 1330  BNP 47.0    DDimer No results for input(s): DDIMER in the last 168 hours.   Radiology  CT HEAD WO CONTRAST ( ) Result Date: 06/11/2024 CLINICAL DATA:  Persistent headache- 3 days. EXAM: CT HEAD WITHOUT CONTRAST TECHNIQUE: Contiguous axial images were obtained from the base of the skull through the vertex without intravenous contrast. RADIATION DOSE REDUCTION: This exam was performed according to the departmental dose-optimization program which includes automated exposure control, adjustment of the mA and/or kV according to patient size and/or use of iterative reconstruction technique. COMPARISON:  CT head Jan 31, 2004 FINDINGS: Brain: No evidence of acute infarction, hemorrhage, hydrocephalus, extra-axial collection or mass lesion/mass effect. Vascular: No hyperdense vessel. Skull: No acute fracture. Sinuses/Orbits: Clear sinuses.  No acute orbital findings. Other: No mastoid  effusions. IMPRESSION: No evidence of acute intracranial abnormality. Electronically Signed   By: Gilmore GORMAN Molt M.D.   On: 06/11/2024 03:34   DG CHEST PORT 1 VIEW Result Date: 06/10/2024 EXAM: 1 VIEW(S) XRAY OF THE CHEST 06/10/2024 04:41:00 PM COMPARISON: None available. CLINICAL HISTORY: Patient presents with headache. More notably, the patient was informed today from her medical device rep that her implanted defibrillator discharged. She notes that over the past 3 days she has had headache, without focal neuro complaints, with relief with Tylenol . She has been feeling weaker than usual over the past few days as well. FINDINGS: LUNGS AND PLEURA: No focal pulmonary opacity. No pulmonary edema. No pleural effusion. No pneumothorax. HEART AND MEDIASTINUM: Left chest wall single lead cardiac pacemaker noted. Atherosclerotic plaque noted. BONES AND SOFT TISSUES: Partially visualized ACDF hardware noted. No acute osseous abnormality. IMPRESSION: 1. No acute findings. Electronically signed by: Dorethia Molt MD 06/10/2024 08:31 PM EDT RP Workstation: [id number]    Cardiac Studies ECHO pending   ECHO 2020 IMPRESSIONS   1. Left ventricular ejection fraction, by visual estimation, is 60 to  65%. The left ventricle has normal function. There is no left ventricular  hypertrophy.   2. LV endocardium is not well visualized . Technically difficult study.   3. Global right ventricle has normal systolic function.The right  ventricular size is normal. No increase in right ventricular wall  thickness.   4. Left atrial size was normal.   5. Right atrial size was normal.   6. The mitral valve is normal in structure. No evidence of mitral valve  regurgitation.   7. The tricuspid valve is normal in structure. Tricuspid valve  regurgitation is trivial.   8. The aortic valve is normal in structure. Aortic valve regurgitation is  not visualized.   9. The pulmonic valve was normal in structure. Pulmonic valve   regurgitation is not visualized.  10. Normal pulmonary artery systolic pressure.  11. The atrial septum is grossly normal.   Patient Profile   68 y.o. female with hx of CAD (s/p anterior MI in 2010 tx with PCI (BMS) to LAD, ischemic cardiomyopathy/HFimpEF (EF 60-65% in 2020), VT/SVT s/p (ICD [employer] implanted for primary prevention 06/2010, gen change 11/2017), HLD, CKD, GERD, hx of GI bleed in 04/2020 who is being seen 06/10/2024 for the evaluation of ICD at the request of Dr. Garrick.   Assessment & Plan  VT/VF  ICD in place  Device check 05/23/2024: V-paced, normal function, no arrhythmia Remote ICD interrogation 06/10/2024 revealed shock delivered at 7:06 this morning for treatment of polymorphic VT. She also had additional VF events treated with ATP and multiple episodes of NSVT.  EKG showed NSR,  HR 84 with PVC's Tele this am: NSR, mainly HR 70-100 with multiples episodes of NSVT with longest 9 seconds, ventricular bigeminy/trigeminy Per chart review, confirmed via remote interrogation that patient had 5 shocks on 06/10/2024.  Spoke with cardiology fellow on-call at [facility] Dr. Otelia.  Started amiodarone  bolus 150 mg and drip. Continue Amio.  Patient reports she did not feel any shocks overnight or this morning.  Also notes significant improvement and is feeling much better. Negative UDS.  K 3.8, Mg 2.2. Goal K 4-5 and Mg >2.  Receiving K and Mg supplements.  Continue to monitor. ECHO pending to assess for LV function.  In the setting of frequent nonsustained V. tach on telemetry, will plan to transfer to [facility].  Will notify hospitalist to facilitate.   HFimpEF  TTE 07/31/2019: EF 60-65, normal RVSF, trivial TR - *difficult acoustic windows >> reviewed by Dr. Wonda and felt to demonstrate stable mod LV dysfunction  Denies any SOB, edema, orthopnea. Appear Euvolemic on exam. No need for IV diuretics or repeat ECHO at this time.  Discontinue Lasix  and lisinopril  due to soft BPs.   Although patient states that BP is usually normally low in 90s.  Denies any current dizziness. Continue KCL 40 MEQ every other day and Lopressor  50 mg BID   Hypotension  Dizziness  BP on soft side since restarting BP medications. Currently 109/52.  Reports 1 episode of dizziness this morning when ambulating from bed to recliner in room.  Currently denies any dizziness Discontinue Lasix  and lisinopril  due to soft BPs.  Although patient states that BP is usually normally low in 90s.  Denies any current dizziness.   CAD  HLD Hx of anterior MI in 2010 tx with BMS to the LAD.  Denies any angina symptoms. No need for additional ischemic evaluation at this time.  Discontinue lisinopril  as above Continue ASA 81 mg once daily, Crestor  40 mg once daily, Lopressor  50 mg twice daily.    CKD Stage 3  Cr 1.44 >1.55  (baseline 1.4 in 2023) Continue to monitor   For questions or updates, please contact [facility] Please consult [email or web address] for contact info under       Signed, Lorette CINDERELLA Kapur, PA-C  06/11/2024, 8:49 AM

## 2024-06-11 NOTE — Progress Notes (Addendum)
 PROGRESS NOTE    Patient: [name] [name]                            PCP: Pcp, No                    DOB: 1955-12-20            DOA: 06/10/2024 FMW:[id number]             DOS: 06/11/2024, 10:26 AM   LOS: 1 day   Date of Service: The patient was seen and examined on 06/11/2024  Subjective:   The patient was seen and examined this morning. Heart rate 32-1 06, currently 55, blood pressure low at 88/52 satting 99% on room air  Brief Narrative:    LUNABELLA BADGETT is a 68 y.o. female with medical history significant for congestive heart failure with reduced EF- ICD status, CKD 3.  Patient was called this morning to come to the emergency department after her ICD delivered shocks at about 7 AM this morning for polymorphic VT. happened while asleep, subsequently happened multiple times during the day. Apparently ran out of her medications; including metoprolol , lisinopril , Lasix , aspirin , Crestor . Per patient stopped her medication including amiodarone  for low blood pressure    She also reports persistent headache over the past 3 days, worst she has ever experienced, no visual deficits, no focal weakness of extremities, felt like this morning she was not herself.  Headache has improved.      ED Course: Temperature 98.3.  Heart rate 67-91.  Respiratory rate 11-21.  SBP 108-150.  O2>96% on RA Potassium 4.4. Mag -2.3.  BNP 47. Cardiology was consulted, patient evaluated in ED, recommended admission here.      Assessment & Plan:   Principal Problem:   Polymorphic ventricular tachycardia (HCC) Active Problems:   CAD (coronary artery disease)   HFrEF (heart failure with reduced ejection fraction) (HCC)   Dual implantable cardioverter-defibrillator in situ   Stage 3b chronic kidney disease (HCC)  Assessment and Plan:  Ventricular tachycardia-  - status post ICD firing, multiple times,  Multiple episodes of nonsustained V. Tach  Discussed with cardiology, transferring for [facility] for EP  evaluation Dr. Debera  -On amiodarone  drip, heart rate fluctuating 32-1 06, currently at 55 Blood pressure low at 88/52 - Heart rate improving, but hypotensive On metoprolol  50 mg p.o. twice daily -Holding home BP medications   -  Ran out of her medications- lisinopril , metoprolol , vascepa , Lasix  20 mg, aspirin  and Crestor  about 3 weeks ago.  Echo 2020 EF of 60 to 65%.  -Cardiology consulted, following closely appreciate further evaluation and input -Pending echocardiogram  -S/p interrogation of the device confirming delivery of shocks x 5 times  -Overnight the case was discussed with cardiologist Dr. Otelia,  - Maintaining K > 4.0, magnesium > 2.0 -replacing accordingly this a.m. - Trop X 2: 13, 13   Hypotension  - Monitoring closely, as needed IVF bolus,  Ischemic cardiomyopathy, last echo 2020 EF of 60 to 65%.  Appears euvolemic, BNP 47.  No peripheral signs of edema.    Coronary artery disease- status post previous anterior wall infarct in 2010 treated with BMS to the LAD.     cephalgia -Likely due to fluctuating heart rate, Bps -Monitoring closely - Obtain head CT for persistent headache   CKD 3 -creatinine 1.44.  Stable. Lab Results  Component Value Date   CREATININE 1.55 (H) 06/11/2024  CREATININE 1.44 (H) 06/10/2024   CREATININE 1.43 (H) 11/18/2021      DVT prophylaxis: Lovenox  Code Status: FULL Family Communication: None at bedside Disposition Plan: ~ 2 days Consults called: Cardiology Admission status:  Inpt Stepdown I certify that at the point of admission it is my clinical judgment that the patient will require inpatient hospital care spanning beyond 2 midnights from the point of admission due to high intensity of service, high risk for further deterioration and high frequency of surveillance required.     DVT prophylaxis:  enoxaparin  (LOVENOX ) injection 40 mg Start: 06/10/24 2200   Code Status:   Code Status: Full Code  Family Communication:  No family member present at bedside-  -Advance care planning has been discussed.   Admission status:   Status is: Inpatient Remains inpatient appropriate because: Needing amiodarone  drip for better rate control, defibrillator firing needing EP evaluation, cardiology fo   Disposition: From  - home             Planning for discharge in 1-2 days   Procedures:   No admission procedures for hospital encounter.   Antimicrobials:  Anti-infectives (From admission, onward)    None        Medication:   aspirin  EC  81 mg Oral Daily   Chlorhexidine  Gluconate Cloth  6 each Topical Q0600   enoxaparin  (LOVENOX ) injection  40 mg Subcutaneous Q24H   metoprolol  tartrate  50 mg Oral BID   potassium chloride  SA  40 mEq Oral QODAY   rosuvastatin   40 mg Oral Daily    acetaminophen  **OR** acetaminophen , nitroGLYCERIN , ondansetron  **OR** ondansetron  (ZOFRAN ) IV, polyethylene glycol, traMADol    Objective:   Vitals:   06/11/24 0800 06/11/24 0815 06/11/24 0845 06/11/24 0915  BP: (!) 109/52 118/67 103/78 (!) 88/52  Pulse: 71  65 (!) 55  Resp: 18 18 16 16   Temp:      TempSrc:      SpO2: 100% 90% 99% 99%  Weight:      Height:        Intake/Output Summary (Last 24 hours) at 06/11/2024 1026 Last data filed at 06/11/2024 0826 Gross per 24 hour  Intake 464.11 ml  Output 550 ml  Net -85.89 ml   Filed Weights   06/10/24 1205 06/10/24 1632  Weight: 67.6 kg 66 kg     Physical examination:   General:  AAO x 3,  cooperative, no distress;   HEENT:  Normocephalic, PERRL, otherwise with in Normal limits   Neuro:  CNII-XII intact. , normal motor and sensation, reflexes intact   Lungs:   Clear to auscultation BL, Respirations unlabored,  No wheezes / crackles  Cardio:    Irregularly irregular, S1, S2,  no murmure, No Rubs or Gallops   Abdomen:  Soft, non-tender, bowel sounds active all four quadrants, no guarding or peritoneal signs.  Muscular  skeletal:  Limited exam -global generalized  weaknesses - in bed, able to move all 4 extremities,   2+ pulses,  symmetric, No pitting edema  Skin:  Dry, warm to touch, negative for any Rashes,  Wounds: Please see nursing documentation       ------------------------------------------------------------------------------------------------------------------------------------------    LABs:     Latest Ref Rng & Units 06/11/2024    4:54 AM 06/10/2024    1:30 PM 11/03/2021    9:46 AM  CBC  WBC 4.0 - 10.5 K/uL 6.9  7.2  9.5   Hemoglobin 12.0 - 15.0 g/dL 86.6  86.6  87.9   Hematocrit  36.0 - 46.0 % 41.3  41.9  36.8   Platelets 150 - 400 K/uL 187  174  253       Latest Ref Rng & Units 06/11/2024    4:54 AM 06/10/2024    1:30 PM 08/26/2022   10:03 AM  CMP  Glucose 70 - 99 mg/dL 899  98    BUN 8 - 23 mg/dL 27  21    Creatinine 9.55 - 1.00 mg/dL 8.44  8.55    Sodium 864 - 145 mmol/L 138  137    Potassium 3.5 - 5.1 mmol/L 3.8  4.4    Chloride 98 - 111 mmol/L 106  104    CO2 22 - 32 mmol/L 21  21    Calcium  8.9 - 10.3 mg/dL 9.3  9.7    Total Protein 6.5 - 8.1 g/dL  7.9  7.2   Total Bilirubin 0.0 - 1.2 mg/dL  0.7  0.3   Alkaline Phos 38 - 126 U/L  135  149   AST 15 - 41 U/L  38  19   ALT 0 - 44 U/L  43  20        Micro Results Recent Results (from the past 240 hours)  MRSA Next Gen by PCR, Nasal     Status: None   Collection Time: 06/10/24  4:23 PM   Specimen: Nasal Mucosa; Nasal Swab  Result Value Ref Range Status   MRSA by PCR Next Gen NOT DETECTED NOT DETECTED Final    Comment: (NOTE) The GeneXpert MRSA Assay (FDA approved for NASAL specimens only), is one component of a comprehensive MRSA colonization surveillance program. It is not intended to diagnose MRSA infection nor to guide or monitor treatment for MRSA infections. Test performance is not FDA approved in patients less than 27 years old. Performed at [facility], [address], [place], [place] [address]     Radiology Reports CT HEAD WO CONTRAST  ( ) Result Date: 06/11/2024 CLINICAL DATA:  Persistent headache- 3 days. EXAM: CT HEAD WITHOUT CONTRAST TECHNIQUE: Contiguous axial images were obtained from the base of the skull through the vertex without intravenous contrast. RADIATION DOSE REDUCTION: This exam was performed according to the departmental dose-optimization program which includes automated exposure control, adjustment of the mA and/or kV according to patient size and/or use of iterative reconstruction technique. COMPARISON:  CT head Jan 31, 2004 FINDINGS: Brain: No evidence of acute infarction, hemorrhage, hydrocephalus, extra-axial collection or mass lesion/mass effect. Vascular: No hyperdense vessel. Skull: No acute fracture. Sinuses/Orbits: Clear sinuses.  No acute orbital findings. Other: No mastoid effusions. IMPRESSION: No evidence of acute intracranial abnormality. Electronically Signed   By: Gilmore GORMAN Molt M.D.   On: 06/11/2024 03:34   DG CHEST PORT 1 VIEW Result Date: 06/10/2024 EXAM: 1 VIEW(S) XRAY OF THE CHEST 06/10/2024 04:41:00 PM COMPARISON: None available. CLINICAL HISTORY: Patient presents with headache. More notably, the patient was informed today from her medical device rep that her implanted defibrillator discharged. She notes that over the past 3 days she has had headache, without focal neuro complaints, with relief with Tylenol . She has been feeling weaker than usual over the past few days as well. FINDINGS: LUNGS AND PLEURA: No focal pulmonary opacity. No pulmonary edema. No pleural effusion. No pneumothorax. HEART AND MEDIASTINUM: Left chest wall single lead cardiac pacemaker noted. Atherosclerotic plaque noted. BONES AND SOFT TISSUES: Partially visualized ACDF hardware noted. No acute osseous abnormality. IMPRESSION: 1. No acute findings. Electronically signed by: Dorethia Molt MD  06/10/2024 08:31 PM EDT RP Workstation: [id number]    SIGNED: Adriana DELENA Grams, MD, FHM. FAAFP. [facility] Critical care time spent - 55 min.  In seeing, evaluating and examining the patient. Reviewing medical records, labs, drawn plan of care. [facility],  Pager (please use [email or web address] to page/ text) Please use Epic Secure Chat for non-urgent communication (7AM-7PM)  If 7PM-7AM, please contact night-coverage [email or web address], 06/11/2024, 10:26 AM

## 2024-06-11 NOTE — Hospital Course (Signed)
 [name] [name] is a 68 y.o. female with medical history significant for congestive heart failure with reduced EF- ICD status, CKD 3.  Patient was called this morning to come to the emergency department after her ICD delivered shocks at about 7 AM this morning for polymorphic VT. happened while asleep, subsequently happened multiple times during the day. Apparently ran out of her medications; including metoprolol , lisinopril , Lasix , aspirin , Crestor . Per patient stopped her medication including amiodarone  for low blood pressure    She also reports persistent headache over the past 3 days, worst she has ever experienced, no visual deficits, no focal weakness of extremities, felt like this morning she was not herself.  Headache has improved.      ED Course: Temperature 98.3.  Heart rate 67-91.  Respiratory rate 11-21.  SBP 108-150.  O2>96% on RA Potassium 4.4. Mag -2.3.  BNP 47. Cardiology was consulted, patient evaluated in ED, recommended admission here.

## 2024-06-12 ENCOUNTER — Encounter (HOSPITAL_COMMUNITY)
Admission: EM | Disposition: A | Discharge status: Home / Self Care | Payer: Self-pay | Source: Ambulatory Visit | Attending: Family Medicine

## 2024-06-12 DIAGNOSIS — I2510 Atherosclerotic heart disease of native coronary artery without angina pectoris: Secondary | ICD-10-CM | POA: Diagnosis not present

## 2024-06-12 DIAGNOSIS — I4729 Other ventricular tachycardia: Secondary | ICD-10-CM | POA: Diagnosis not present

## 2024-06-12 DIAGNOSIS — I5023 Acute on chronic systolic (congestive) heart failure: Secondary | ICD-10-CM | POA: Diagnosis not present

## 2024-06-12 HISTORY — PX: LEFT HEART CATH AND CORONARY ANGIOGRAPHY: CATH118249

## 2024-06-12 LAB — CBC
HCT: 41.5 % (ref 36.0–46.0)
Hemoglobin: 13.1 g/dL (ref 12.0–15.0)
MCH: 28.7 pg (ref 26.0–34.0)
MCHC: 31.6 g/dL (ref 30.0–36.0)
MCV: 90.8 fL (ref 80.0–100.0)
Platelets: 165 K/uL (ref 150–400)
RBC: 4.57 MIL/uL (ref 3.87–5.11)
RDW: 14.3 % (ref 11.5–15.5)
WBC: 9 K/uL (ref 4.0–10.5)
nRBC: 0 % (ref 0.0–0.2)

## 2024-06-12 LAB — CREATININE, SERUM
Creatinine, Ser: 1.49 mg/dL — ABNORMAL HIGH (ref 0.44–1.00)
GFR, Estimated: 39 mL/min — ABNORMAL LOW (ref >60)

## 2024-06-12 SURGERY — LEFT HEART CATH AND CORONARY ANGIOGRAPHY
Anesthesia: LOCAL

## 2024-06-12 MED ORDER — FENTANYL CITRATE (PF) 100 MCG/2ML IJ SOLN
INTRAMUSCULAR | Status: DC | PRN
  Administered 2024-06-12 (×2): 25 ug via INTRAVENOUS

## 2024-06-12 MED ORDER — FREE WATER
250.0000 mL | Freq: Once | Status: AC
  Administered 2024-06-12: 250 mL via ORAL

## 2024-06-12 MED ORDER — SODIUM CHLORIDE 0.9% FLUSH
3.0000 mL | Freq: Two times a day (BID) | INTRAVENOUS | Status: DC
  Administered 2024-06-12 – 2024-06-14 (×4): 3 mL via INTRAVENOUS

## 2024-06-12 MED ORDER — LIDOCAINE HCL (PF) 1 % IJ SOLN
INTRAMUSCULAR | Status: AC
  Filled 2024-06-12: qty 30

## 2024-06-12 MED ORDER — HEPARIN SODIUM (PORCINE) 5000 UNIT/ML IJ SOLN
5000.0000 [IU] | Freq: Three times a day (TID) | INTRAMUSCULAR | Status: DC
  Administered 2024-06-12 – 2024-06-14 (×5): 5000 [IU] via SUBCUTANEOUS
  Filled 2024-06-12 (×5): qty 1

## 2024-06-12 MED ORDER — FENTANYL CITRATE (PF) 100 MCG/2ML IJ SOLN
INTRAMUSCULAR | Status: AC
  Filled 2024-06-12: qty 2

## 2024-06-12 MED ORDER — SODIUM CHLORIDE 0.9 % IV SOLN: 250.0000 mL | INTRAVENOUS | Status: AC | PRN

## 2024-06-12 MED ORDER — IOHEXOL 350 MG/ML SOLN
INTRAVENOUS | Status: DC | PRN
  Administered 2024-06-12: 35 mL

## 2024-06-12 MED ORDER — HEPARIN SODIUM (PORCINE) 1000 UNIT/ML IJ SOLN
INTRAMUSCULAR | Status: DC | PRN
  Administered 2024-06-12: 3500 [IU] via INTRAVENOUS

## 2024-06-12 MED ORDER — VERAPAMIL HCL 2.5 MG/ML IV SOLN
INTRAVENOUS | Status: DC | PRN
  Administered 2024-06-12: 10 mL via INTRA_ARTERIAL

## 2024-06-12 MED ORDER — ACETAMINOPHEN 325 MG PO TABS: 650.0000 mg | ORAL_TABLET | ORAL | Status: DC | PRN

## 2024-06-12 MED ORDER — HYDRALAZINE HCL 20 MG/ML IJ SOLN: 10.0000 mg | INTRAMUSCULAR | Status: AC | PRN

## 2024-06-12 MED ORDER — AMIODARONE HCL 200 MG PO TABS
400.0000 mg | ORAL_TABLET | Freq: Two times a day (BID) | ORAL | Status: DC
  Administered 2024-06-13 – 2024-06-14 (×3): 400 mg via ORAL
  Filled 2024-06-12 (×3): qty 2

## 2024-06-12 MED ORDER — LABETALOL HCL 5 MG/ML IV SOLN: 10.0000 mg | INTRAVENOUS | Status: AC | PRN

## 2024-06-12 MED ORDER — SODIUM CHLORIDE 0.9% FLUSH: 3.0000 mL | INTRAVENOUS | Status: DC | PRN

## 2024-06-12 MED ORDER — VERAPAMIL HCL 2.5 MG/ML IV SOLN
INTRAVENOUS | Status: AC
  Filled 2024-06-12: qty 2

## 2024-06-12 MED ORDER — HEPARIN (PORCINE) IN NACL 1000-0.9 UT/500ML-% IV SOLN
INTRAVENOUS | Status: DC | PRN
  Administered 2024-06-12: 1000 mL

## 2024-06-12 MED ORDER — LIDOCAINE HCL (PF) 1 % IJ SOLN
INTRAMUSCULAR | Status: DC | PRN
  Administered 2024-06-12: 5 mL

## 2024-06-12 MED ORDER — METOPROLOL SUCCINATE ER 100 MG PO TB24
100.0000 mg | ORAL_TABLET | Freq: Every evening | ORAL | Status: DC
  Administered 2024-06-12 – 2024-06-13 (×2): 100 mg via ORAL
  Filled 2024-06-12 (×2): qty 1

## 2024-06-12 MED ORDER — SODIUM CHLORIDE 0.9 % IV SOLN: INTRAVENOUS | Status: DC

## 2024-06-12 MED ORDER — MIDAZOLAM HCL 2 MG/2ML IJ SOLN
INTRAMUSCULAR | Status: AC
  Filled 2024-06-12: qty 2

## 2024-06-12 MED ORDER — MIDAZOLAM HCL 2 MG/2ML IJ SOLN
INTRAMUSCULAR | Status: DC | PRN
  Administered 2024-06-12: 1 mg via INTRAVENOUS

## 2024-06-12 MED ORDER — HEPARIN SODIUM (PORCINE) 1000 UNIT/ML IJ SOLN
INTRAMUSCULAR | Status: AC
  Filled 2024-06-12: qty 10

## 2024-06-12 MED ORDER — CHLORHEXIDINE GLUCONATE CLOTH 2 % EX PADS
6.0000 | MEDICATED_PAD | Freq: Every day | CUTANEOUS | Status: DC
  Administered 2024-06-12: 6 via TOPICAL

## 2024-06-12 SURGICAL SUPPLY — 7 items
CATH 5FR JL3.5 JR4 ANG PIG MP (CATHETERS) IMPLANT
CATH LAUNCHER 6FR EBU 3 (CATHETERS) IMPLANT
DEVICE RAD COMP TR BAND LRG (VASCULAR PRODUCTS) IMPLANT
GLIDESHEATH SLEND A-KIT 6F 22G (SHEATH) IMPLANT
GUIDEWIRE INQWIRE 1.5J.035X260 (WIRE) IMPLANT
PACK CARDIAC CATHETERIZATION (CUSTOM PROCEDURE TRAY) ×1 IMPLANT
SET ATX-X65L (MISCELLANEOUS) IMPLANT

## 2024-06-12 NOTE — Progress Notes (Signed)
 Patient had stated she ran out of her cardiac medications.  She does have a Cardiologist she sees but thought she could handle it with out contacting them.  Patient states I realize it wasn't smart to not inform her doctor.

## 2024-06-12 NOTE — Progress Notes (Addendum)
 Noticed a spot on patients right arm that appears irritated and swollen, outlined the area and also elevated the arm. Will continue to monitor

## 2024-06-12 NOTE — H&P (View-Only) (Signed)
 PROGRESS NOTE    Patient: [name] [name]                            PCP: Pcp, No                    DOB: [birth year]            DOA: 06/10/2024 FMW:[id number]             DOS: 06/12/2024, 1:09 PM   LOS: 2 days   Date of Service: The patient was seen and examined on 06/12/2024  Subjective:   The patient was seen and examined this morning, stable no acute distress denies any chest pain shortness of breath satting 99% room air Heart rate 51-72, currently 55 Mildly hypotensive blood pressure as low as 88/52 currently 118/71 On amiodarone  drip  Brief Narrative:    [name] [name] is a 68 y.o. female with medical history significant for congestive heart failure with reduced EF- ICD status, CKD 3.  Patient was called this morning to come to the emergency department after her ICD delivered shocks at about 7 AM this morning for polymorphic VT. happened while asleep, subsequently happened multiple times during the day. Apparently ran out of her medications; including metoprolol , lisinopril , Lasix , aspirin , Crestor . Per patient stopped her medication including amiodarone  for low blood pressure    She also reports persistent headache over the past 3 days, worst she has ever experienced, no visual deficits, no focal weakness of extremities, felt like this morning she was not herself.  Headache has improved.      ED Course: Temperature 98.3.  Heart rate 67-91.  Respiratory rate 11-21.  SBP 108-150.  O2>96% on RA Potassium 4.4. Mag -2.3.  BNP 47. Cardiology was consulted, patient evaluated in ED, recommended admission here.      Assessment & Plan:   Principal Problem:   Polymorphic ventricular tachycardia (HCC) Active Problems:   CAD (coronary artery disease)   HFrEF (heart failure with reduced ejection fraction) (HCC)   Dual implantable cardioverter-defibrillator in situ   Stage 3b chronic kidney disease (HCC)  Assessment and Plan:  Ventricular tachycardia-  - status post ICD firing,  shocks multiple times > 5 times Multiple episodes of nonsustained V. Tach  EP cardiology following Dr. Trudy  Planning for left heart cath today  Recommended : continue IV Amiodarone  through this evening, then transition to PO: 400mg  BID x5 days, 200mg  BID x5 days, 200mg  daily   - Heart rate improving, but hypotensive -Discontinue beta-blockers due to low EF -Holding home BP medications   -  Ran out of her medications- lisinopril , metoprolol , vascepa , Lasix  20 mg, aspirin  and Crestor  about 3 weeks ago.  Echo 2020 EF of 60 to 65%. Echo:    - Maintaining K > 4.0, magnesium > 2.0 -replacing accordingly this a.m. - Trop X 2: 13, 13   Hypotension  - Monitoring closely, as needed IVF bolus,  Ischemic cardiomyopathy, with acute on chronic HFr EF previously with LVEF 35-40%, later recovered. NYHA class I-II.  Repeat TTE this admission shows LVEF back down to 35-40%  with significant septal wall hypokinesis/akinesis.  Optimizing medical management with heart failure team of HF GDMT.   Lopressor  to Toprol  XL.  Consider starting ARB and SGLT2 in am .    Coronary artery disease-  status post previous anterior wall infarct in 2010 treated with BMS to the LAD.  -Pending repeat left  heart cath Continue aspirin , statins  Cephalgia -Resolved -Monitoring closely    CKD 3 -creatinine 1.44.  Stable. Lab Results  Component Value Date   CREATININE 1.55 (H) 06/11/2024   CREATININE 1.44 (H) 06/10/2024   CREATININE 1.43 (H) 11/18/2021      DVT prophylaxis: Lovenox  Code Status: FULL Family Communication: None at bedside Disposition Plan: ~ 2 days Consults called: Cardiology Admission status:  Inpt Stepdown I certify that at the point of admission it is my clinical judgment that the patient will require inpatient hospital care spanning beyond 2 midnights from the point of admission due to high intensity of service, high risk for further deterioration and high frequency of  surveillance required.     DVT prophylaxis:  Place and maintain sequential compression device Start: 06/11/24 1943   Code Status:   Code Status: Full Code  Family Communication: No family member present at bedside-  -Advance care planning has been discussed.   Admission status:   Status is: Inpatient Remains inpatient appropriate because: Needing amiodarone  drip for better rate control, defibrillator firing needing EP evaluation, cardiology fo   Disposition: From  - home             Planning for discharge in 1-2 days   Procedures:   No admission procedures for hospital encounter.   Antimicrobials:  Anti-infectives (From admission, onward)    None        Medication:   aspirin  EC  81 mg Oral Daily   Chlorhexidine  Gluconate Cloth  6 each Topical Q0600   metoprolol  succinate  100 mg Oral QPM   potassium chloride  SA  40 mEq Oral QODAY   rosuvastatin   40 mg Oral Daily    acetaminophen  **OR** acetaminophen , nitroGLYCERIN , ondansetron  **OR** ondansetron  (ZOFRAN ) IV, polyethylene glycol, traMADol    Objective:   Vitals:   06/12/24 0510 06/12/24 0802 06/12/24 0826 06/12/24 1216  BP:   110/70 118/71  Pulse: (!) 51  61 (!) 55  Resp:   17 15  Temp:   97.7 F (36.5 C) 98.8 F (37.1 C)  TempSrc:   Oral Oral  SpO2: 100%   99%  Weight:  68.1 kg    Height:        Intake/Output Summary (Last 24 hours) at 06/12/2024 1309 Last data filed at 06/12/2024 0357 Gross per 24 hour  Intake 1312.39 ml  Output --  Net 1312.39 ml   Filed Weights   06/10/24 1632 06/11/24 1827 06/12/24 0802  Weight: 66 kg 68.3 kg 68.1 kg     Physical examination:   General:  AAO x 3,  cooperative, no distress;   HEENT:  Normocephalic, PERRL, otherwise with in Normal limits   Neuro:  CNII-XII intact. , normal motor and sensation, reflexes intact   Lungs:   Clear to auscultation BL, Respirations unlabored,  No wheezes / crackles  Cardio:    S1/S2, RRR, No murmure, No Rubs or Gallops    Abdomen:  Soft, non-tender, bowel sounds active all four quadrants, no guarding or peritoneal signs.  Muscular  skeletal:  Limited exam -global generalized weaknesses - in bed, able to move all 4 extremities,   2+ pulses,  symmetric, No pitting edema  Skin:  Dry, warm to touch, negative for any Rashes,  Wounds: Please see nursing documentation ------------------------------------------------------------------------------------------------------------------------    LABs:     Latest Ref Rng & Units 06/11/2024    4:54 AM 06/10/2024    1:30 PM 11/03/2021    9:46 AM  CBC  WBC  4.0 - 10.5 K/uL 6.9  7.2  9.5   Hemoglobin 12.0 - 15.0 g/dL 86.6  86.6  87.9   Hematocrit 36.0 - 46.0 % 41.3  41.9  36.8   Platelets 150 - 400 K/uL 187  174  253       Latest Ref Rng & Units 06/11/2024    4:54 AM 06/10/2024    1:30 PM 08/26/2022   10:03 AM  CMP  Glucose 70 - 99 mg/dL 899  98    BUN 8 - 23 mg/dL 27  21    Creatinine 9.55 - 1.00 mg/dL 8.44  8.55    Sodium 864 - 145 mmol/L 138  137    Potassium 3.5 - 5.1 mmol/L 3.8  4.4    Chloride 98 - 111 mmol/L 106  104    CO2 22 - 32 mmol/L 21  21    Calcium  8.9 - 10.3 mg/dL 9.3  9.7    Total Protein 6.5 - 8.1 g/dL  7.9  7.2   Total Bilirubin 0.0 - 1.2 mg/dL  0.7  0.3   Alkaline Phos 38 - 126 U/L  135  149   AST 15 - 41 U/L  38  19   ALT 0 - 44 U/L  43  20        Micro Results Recent Results (from the past 240 hours)  MRSA Next Gen by PCR, Nasal     Status: None   Collection Time: 06/10/24  4:23 PM   Specimen: Nasal Mucosa; Nasal Swab  Result Value Ref Range Status   MRSA by PCR Next Gen NOT DETECTED NOT DETECTED Final    Comment: (NOTE) The GeneXpert MRSA Assay (FDA approved for NASAL specimens only), is one component of a comprehensive MRSA colonization surveillance program. It is not intended to diagnose MRSA infection nor to guide or monitor treatment for MRSA infections. Test performance is not FDA approved in patients less than 62  years old. Performed at [facility], [address] [place], [place] [address]     Radiology Reports ECHOCARDIOGRAM COMPLETE Result Date: 06/11/2024    ECHOCARDIOGRAM REPORT   [name] Date of Exam: 06/11/2024 Medical Rec #:  [id number]    Height:       64.0 in Accession #:    [id number]   Weight:       145.5 lb Date of Birth:  08-11-1956     BSA:          1.709 m Patient Age:    68 years     BP:           94/56 mmHg Patient Gender: F            HR:           54 bpm. Exam Location:  [facility] Procedure: 2D Echo, 3D Echo, Cardiac Doppler, Color Doppler and Intracardiac            Opacification Agent (Both Spectral and Color Flow Doppler were            utilized during procedure). Indications:    Ventricualr Tachycardia l47.2  History:        Patient has prior history of Echocardiogram examinations, most                 recent 07/31/2019. Cardiomyopathy, CAD, Defibrillator; Risk                 Factors:Dyslipidemia. Hx of CKD.  Sonographer:    Aida Pizza RCS Referring Phys: [id number] LORETTE CINDERELLA KAPUR IMPRESSIONS  1. Left ventricular ejection fraction, by estimation, is 35 to 40%. The left ventricle has moderately decreased function. The left ventricle demonstrates regional wall motion abnormalities (see scoring diagram/findings for description). Left ventricular  diastolic parameters were normal.  2. No formed LV mural thrombus evident by Definity  contrast. Transverse false tendon at apex.  3. Right ventricular systolic function is normal. The right ventricular size is normal. Tricuspid regurgitation signal is inadequate for assessing PA pressure.  4. The mitral valve is degenerative. Trivial mitral valve regurgitation.  5. The aortic valve was not well visualized. There is mild calcification of the aortic valve. Aortic valve regurgitation is not visualized.  6. The inferior vena cava is normal in size with greater than 50% respiratory variability, suggesting right atrial pressure of 3  mmHg. Comparison(s): Prior images reviewed side by side. LVEF 35-40% with wall motion abnormalties consistent with ischemic cardiomyopathy. FINDINGS  Left Ventricle: Left ventricular ejection fraction, by estimation, is 35 to 40%. The left ventricle has moderately decreased function. The left ventricle demonstrates regional wall motion abnormalities. Definity  contrast agent was given IV to delineate the left ventricular endocardial borders. The left ventricular internal cavity size was normal in size. There is borderline concentric left ventricular hypertrophy. Left ventricular diastolic parameters were normal.  LV Wall Scoring: The entire anterior septum, mid inferoseptal segment, apical anterior segment, apical inferior segment, and apex are akinetic. The inferior wall and basal inferoseptal segment are hypokinetic. The anterior wall and entire lateral wall are normal. Right Ventricle: The right ventricular size is normal. No increase in right ventricular wall thickness. Right ventricular systolic function is normal. Tricuspid regurgitation signal is inadequate for assessing PA pressure. Left Atrium: Left atrial size was normal in size. Right Atrium: Right atrial size was normal in size. Pericardium: There is no evidence of pericardial effusion. Mitral Valve: The mitral valve is degenerative in appearance. Trivial mitral valve regurgitation. Tricuspid Valve: The tricuspid valve is grossly normal. Tricuspid valve regurgitation is trivial. Aortic Valve: The aortic valve was not well visualized. There is mild calcification of the aortic valve. There is mild aortic valve annular calcification. Aortic valve regurgitation is not visualized. Pulmonic Valve: The pulmonic valve was not well visualized. Pulmonic valve regurgitation is trivial. Aorta: The aortic root is normal in size and structure. Venous: The inferior vena cava is normal in size with greater than 50% respiratory variability, suggesting right atrial  pressure of 3 mmHg. IAS/Shunts: No atrial level shunt detected by color flow Doppler. Additional Comments: 3D was performed not requiring image post processing on an independent workstation and was indeterminate. A device lead is visualized.  LEFT VENTRICLE PLAX 2D LVIDd:         4.30 cm   Diastology LVIDs:         3.20 cm   LV e' medial:    8.08 cm/s LV PW:         1.00 cm   LV E/e' medial:  10.9 LV IVS:        1.00 cm   LV e' lateral:   10.10 cm/s LVOT diam:     1.80 cm   LV E/e' lateral: 8.7 LV SV:         40 LV SV Index:   24 LVOT Area:     2.54 cm  3D Volume EF:                          LV EDV:       120 ml                          LV ESV:       43 ml                          LV SV:        77 ml RIGHT VENTRICLE RV S prime:     10.20 cm/s TAPSE (M-mode): 2.7 cm LEFT ATRIUM             Index        RIGHT ATRIUM           Index LA diam:        3.20 cm 1.87 cm/m   RA Area:     12.50 cm LA Vol (A2C):   39.8 ml 23.29 ml/m  RA Volume:   30.50 ml  17.85 ml/m LA Vol (A4C):   38.1 ml 22.29 ml/m LA Biplane Vol: 39.0 ml 22.82 ml/m  AORTIC VALVE LVOT Vmax:   66.60 cm/s LVOT Vmean:  46.800 cm/s LVOT VTI:    0.158 m  AORTA Ao Root diam: 3.20 cm MITRAL VALVE MV Area (PHT): 2.73 cm    SHUNTS MV Decel Time: 278 msec    Systemic VTI:  0.16 m MV E velocity: 88.20 cm/s  Systemic Diam: 1.80 cm MV A velocity: 71.70 cm/s MV E/A ratio:  1.23 Jayson Sierras MD Electronically signed by Jayson Sierras MD Signature Date/Time: 06/11/2024/1:23:56 PM    Final     SIGNED: Adriana DELENA Grams, MD, FHM. FAAFP. Jolynn [facility] - [facility]  Critical care time spent - 55 min.  In seeing, evaluating and examining the patient. Reviewing medical records, labs, drawn plan of care.   [facility],  Pager (please use [email or web address] to page/ text) Please use Epic Secure Chat for non-urgent communication (7AM-7PM)  If 7PM-7AM, please contact night-coverage [email or web address], 06/12/2024, 1:09 PM

## 2024-06-12 NOTE — Interval H&P Note (Signed)
 History and Physical Interval Note:  06/12/2024 3:35 PM  [name] [name]  has presented today for surgery, with the diagnosis of nstemi.  The various methods of treatment have been discussed with the patient and family. After consideration of risks, benefits and other options for treatment, the patient has consented to  Procedure(s): LEFT HEART CATH AND CORONARY ANGIOGRAPHY (N/A) as a surgical intervention.  The patient's history has been reviewed, patient examined, no change in status, stable for surgery.  I have reviewed the patient's chart and labs.  Questions were answered to the patient's satisfaction.     Kajuana Shareef J Ramata Strothman

## 2024-06-12 NOTE — Plan of Care (Signed)
  Problem: Education: Goal: Knowledge of General Education information will improve Description Including pain rating scale, medication(s)/side effects and non-pharmacologic comfort measures Outcome: Progressing   Problem: Health Behavior/Discharge Planning: Goal: Ability to manage health-related needs will improve Outcome: Progressing   Problem: Clinical Measurements: Goal: Ability to maintain clinical measurements within normal limits will improve Outcome: Progressing Goal: Will remain free from infection Outcome: Progressing Goal: Diagnostic test results will improve Outcome: Progressing   Problem: Activity: Goal: Risk for activity intolerance will decrease Outcome: Progressing   Problem: Safety: Goal: Ability to remain free from injury will improve Outcome: Progressing   Problem: Skin Integrity: Goal: Risk for impaired skin integrity will decrease Outcome: Progressing

## 2024-06-12 NOTE — Plan of Care (Signed)
 Discussed with patient plan of care for the evening, pain management and medications with some teach back displayed.     Problem: Pain Managment: Goal: General experience of comfort will improve and/or be controlled Outcome: Progressing   Problem: Education: Goal: Knowledge of General Education information will improve Description: Including pain rating scale, medication(s)/side effects and non-pharmacologic comfort measures Outcome: Progressing

## 2024-06-12 NOTE — Progress Notes (Signed)
 PROGRESS NOTE    Patient: [name] [name]                            PCP: Pcp, No                    DOB: [birth year]            DOA: 06/10/2024 [id number]             DOS: 06/12/2024, 1:09 PM   LOS: 2 days   Date of Service: The patient was seen and examined on 06/12/2024  Subjective:   The patient was seen and examined this morning, stable no acute distress denies any chest pain shortness of breath satting 99% room air Heart rate 51-72, currently 55 Mildly hypotensive blood pressure as low as 88/52 currently 118/71 On amiodarone  drip  Brief Narrative:    [name] [name] is a 68 y.o. female with medical history significant for congestive heart failure with reduced EF- ICD status, CKD 3.  Patient was called this morning to come to the emergency department after her ICD delivered shocks at about 7 AM this morning for polymorphic VT. happened while asleep, subsequently happened multiple times during the day. Apparently ran out of her medications; including metoprolol , lisinopril , Lasix , aspirin , Crestor . Per patient stopped her medication including amiodarone  for low blood pressure    She also reports persistent headache over the past 3 days, worst she has ever experienced, no visual deficits, no focal weakness of extremities, felt like this morning she was not herself.  Headache has improved.      ED Course: Temperature 98.3.  Heart rate 67-91.  Respiratory rate 11-21.  SBP 108-150.  O2>96% on RA Potassium 4.4. Mag -2.3.  BNP 47. Cardiology was consulted, patient evaluated in ED, recommended admission here.      Assessment & Plan:   Principal Problem:   Polymorphic ventricular tachycardia (HCC) Active Problems:   CAD (coronary artery disease)   HFrEF (heart failure with reduced ejection fraction) (HCC)   Dual implantable cardioverter-defibrillator in situ   Stage 3b chronic kidney disease (HCC)  Assessment and Plan:  Ventricular tachycardia-  - status post ICD firing,  shocks multiple times > 5 times Multiple episodes of nonsustained V. Tach  EP cardiology following Dr. Trudy  Planning for left heart cath today  Recommended : continue IV Amiodarone  through this evening, then transition to PO: 400mg  BID x5 days, 200mg  BID x5 days, 200mg  daily   - Heart rate improving, but hypotensive -Discontinue beta-blockers due to low EF -Holding home BP medications   -  Ran out of her medications- lisinopril , metoprolol , vascepa , Lasix  20 mg, aspirin  and Crestor  about 3 weeks ago.  Echo 2020 EF of 60 to 65%. Echo:    - Maintaining K > 4.0, magnesium > 2.0 -replacing accordingly this a.m. - Trop X 2: 13, 13   Hypotension  - Monitoring closely, as needed IVF bolus,  Ischemic cardiomyopathy, with acute on chronic HFr EF previously with LVEF 35-40%, later recovered. NYHA class I-II.  Repeat TTE this admission shows LVEF back down to 35-40%  with significant septal wall hypokinesis/akinesis.  Optimizing medical management with heart failure team of HF GDMT.   Lopressor  to Toprol  XL.  Consider starting ARB and SGLT2 in am .    Coronary artery disease-  status post previous anterior wall infarct in 2010 treated with BMS to the LAD.  -Pending repeat left  heart cath Continue aspirin , statins  Cephalgia -Resolved -Monitoring closely    CKD 3 -creatinine 1.44.  Stable. Lab Results  Component Value Date   CREATININE 1.55 (H) 06/11/2024   CREATININE 1.44 (H) 06/10/2024   CREATININE 1.43 (H) 11/18/2021      DVT prophylaxis: Lovenox  Code Status: FULL Family Communication: None at bedside Disposition Plan: ~ 2 days Consults called: Cardiology Admission status:  Inpt Stepdown I certify that at the point of admission it is my clinical judgment that the patient will require inpatient hospital care spanning beyond 2 midnights from the point of admission due to high intensity of service, high risk for further deterioration and high frequency of  surveillance required.     DVT prophylaxis:  Place and maintain sequential compression device Start: 06/11/24 1943   Code Status:   Code Status: Full Code  Family Communication: No family member present at bedside-  -Advance care planning has been discussed.   Admission status:   Status is: Inpatient Remains inpatient appropriate because: Needing amiodarone  drip for better rate control, defibrillator firing needing EP evaluation, cardiology fo   Disposition: From  - home             Planning for discharge in 1-2 days   Procedures:   No admission procedures for hospital encounter.   Antimicrobials:  Anti-infectives (From admission, onward)    None        Medication:   aspirin  EC  81 mg Oral Daily   Chlorhexidine  Gluconate Cloth  6 each Topical Q0600   metoprolol  succinate  100 mg Oral QPM   potassium chloride  SA  40 mEq Oral QODAY   rosuvastatin   40 mg Oral Daily    acetaminophen  **OR** acetaminophen , nitroGLYCERIN , ondansetron  **OR** ondansetron  (ZOFRAN ) IV, polyethylene glycol, traMADol    Objective:   Vitals:   06/12/24 0510 06/12/24 0802 06/12/24 0826 06/12/24 1216  BP:   110/70 118/71  Pulse: (!) 51  61 (!) 55  Resp:   17 15  Temp:   97.7 F (36.5 C) 98.8 F (37.1 C)  TempSrc:   Oral Oral  SpO2: 100%   99%  Weight:  68.1 kg    Height:        Intake/Output Summary (Last 24 hours) at 06/12/2024 1309 Last data filed at 06/12/2024 0357 Gross per 24 hour  Intake 1312.39 ml  Output --  Net 1312.39 ml   Filed Weights   06/10/24 1632 06/11/24 1827 06/12/24 0802  Weight: 66 kg 68.3 kg 68.1 kg     Physical examination:   General:  AAO x 3,  cooperative, no distress;   HEENT:  Normocephalic, PERRL, otherwise with in Normal limits   Neuro:  CNII-XII intact. , normal motor and sensation, reflexes intact   Lungs:   Clear to auscultation BL, Respirations unlabored,  No wheezes / crackles  Cardio:    S1/S2, RRR, No murmure, No Rubs or Gallops    Abdomen:  Soft, non-tender, bowel sounds active all four quadrants, no guarding or peritoneal signs.  Muscular  skeletal:  Limited exam -global generalized weaknesses - in bed, able to move all 4 extremities,   2+ pulses,  symmetric, No pitting edema  Skin:  Dry, warm to touch, negative for any Rashes,  Wounds: Please see nursing documentation ------------------------------------------------------------------------------------------------------------------------    LABs:     Latest Ref Rng & Units 06/11/2024    4:54 AM 06/10/2024    1:30 PM 11/03/2021    9:46 AM  CBC  WBC  4.0 - 10.5 K/uL 6.9  7.2  9.5   Hemoglobin 12.0 - 15.0 g/dL 86.6  86.6  87.9   Hematocrit 36.0 - 46.0 % 41.3  41.9  36.8   Platelets 150 - 400 K/uL 187  174  253       Latest Ref Rng & Units 06/11/2024    4:54 AM 06/10/2024    1:30 PM 08/26/2022   10:03 AM  CMP  Glucose 70 - 99 mg/dL 899  98    BUN 8 - 23 mg/dL 27  21    Creatinine 9.55 - 1.00 mg/dL 8.44  8.55    Sodium 864 - 145 mmol/L 138  137    Potassium 3.5 - 5.1 mmol/L 3.8  4.4    Chloride 98 - 111 mmol/L 106  104    CO2 22 - 32 mmol/L 21  21    Calcium  8.9 - 10.3 mg/dL 9.3  9.7    Total Protein 6.5 - 8.1 g/dL  7.9  7.2   Total Bilirubin 0.0 - 1.2 mg/dL  0.7  0.3   Alkaline Phos 38 - 126 U/L  135  149   AST 15 - 41 U/L  38  19   ALT 0 - 44 U/L  43  20        Micro Results Recent Results (from the past 240 hours)  MRSA Next Gen by PCR, Nasal     Status: None   Collection Time: 06/10/24  4:23 PM   Specimen: Nasal Mucosa; Nasal Swab  Result Value Ref Range Status   MRSA by PCR Next Gen NOT DETECTED NOT DETECTED Final    Comment: (NOTE) The GeneXpert MRSA Assay (FDA approved for NASAL specimens only), is one component of a comprehensive MRSA colonization surveillance program. It is not intended to diagnose MRSA infection nor to guide or monitor treatment for MRSA infections. Test performance is not FDA approved in patients less than 62  years old. Performed at [facility], [address] [place], [place] [address]     Radiology Reports ECHOCARDIOGRAM COMPLETE Result Date: 06/11/2024    ECHOCARDIOGRAM REPORT   Patient Name:   ASIANAE MINKLER Date of Exam: 06/11/2024 Medical Rec #:  [id number]    Height:       64.0 in Accession #:    [id number]   Weight:       145.5 lb Date of Birth:  08-11-1956     BSA:          1.709 m Patient Age:    68 years     BP:           94/56 mmHg Patient Gender: F            HR:           54 bpm. Exam Location:  [facility] Procedure: 2D Echo, 3D Echo, Cardiac Doppler, Color Doppler and Intracardiac            Opacification Agent (Both Spectral and Color Flow Doppler were            utilized during procedure). Indications:    Ventricualr Tachycardia l47.2  History:        Patient has prior history of Echocardiogram examinations, most                 recent 07/31/2019. Cardiomyopathy, CAD, Defibrillator; Risk                 Factors:Dyslipidemia. Hx of CKD.  Sonographer:    Aida Pizza RCS Referring Phys: [id number] LORETTE CINDERELLA KAPUR IMPRESSIONS  1. Left ventricular ejection fraction, by estimation, is 35 to 40%. The left ventricle has moderately decreased function. The left ventricle demonstrates regional wall motion abnormalities (see scoring diagram/findings for description). Left ventricular  diastolic parameters were normal.  2. No formed LV mural thrombus evident by Definity  contrast. Transverse false tendon at apex.  3. Right ventricular systolic function is normal. The right ventricular size is normal. Tricuspid regurgitation signal is inadequate for assessing PA pressure.  4. The mitral valve is degenerative. Trivial mitral valve regurgitation.  5. The aortic valve was not well visualized. There is mild calcification of the aortic valve. Aortic valve regurgitation is not visualized.  6. The inferior vena cava is normal in size with greater than 50% respiratory variability, suggesting right atrial pressure of 3  mmHg. Comparison(s): Prior images reviewed side by side. LVEF 35-40% with wall motion abnormalties consistent with ischemic cardiomyopathy. FINDINGS  Left Ventricle: Left ventricular ejection fraction, by estimation, is 35 to 40%. The left ventricle has moderately decreased function. The left ventricle demonstrates regional wall motion abnormalities. Definity  contrast agent was given IV to delineate the left ventricular endocardial borders. The left ventricular internal cavity size was normal in size. There is borderline concentric left ventricular hypertrophy. Left ventricular diastolic parameters were normal.  LV Wall Scoring: The entire anterior septum, mid inferoseptal segment, apical anterior segment, apical inferior segment, and apex are akinetic. The inferior wall and basal inferoseptal segment are hypokinetic. The anterior wall and entire lateral wall are normal. Right Ventricle: The right ventricular size is normal. No increase in right ventricular wall thickness. Right ventricular systolic function is normal. Tricuspid regurgitation signal is inadequate for assessing PA pressure. Left Atrium: Left atrial size was normal in size. Right Atrium: Right atrial size was normal in size. Pericardium: There is no evidence of pericardial effusion. Mitral Valve: The mitral valve is degenerative in appearance. Trivial mitral valve regurgitation. Tricuspid Valve: The tricuspid valve is grossly normal. Tricuspid valve regurgitation is trivial. Aortic Valve: The aortic valve was not well visualized. There is mild calcification of the aortic valve. There is mild aortic valve annular calcification. Aortic valve regurgitation is not visualized. Pulmonic Valve: The pulmonic valve was not well visualized. Pulmonic valve regurgitation is trivial. Aorta: The aortic root is normal in size and structure. Venous: The inferior vena cava is normal in size with greater than 50% respiratory variability, suggesting right atrial  pressure of 3 mmHg. IAS/Shunts: No atrial level shunt detected by color flow Doppler. Additional Comments: 3D was performed not requiring image post processing on an independent workstation and was indeterminate. A device lead is visualized.  LEFT VENTRICLE PLAX 2D LVIDd:         4.30 cm   Diastology LVIDs:         3.20 cm   LV e' medial:    8.08 cm/s LV PW:         1.00 cm   LV E/e' medial:  10.9 LV IVS:        1.00 cm   LV e' lateral:   10.10 cm/s LVOT diam:     1.80 cm   LV E/e' lateral: 8.7 LV SV:         40 LV SV Index:   24 LVOT Area:     2.54 cm  3D Volume EF:                          LV EDV:       120 ml                          LV ESV:       43 ml                          LV SV:        77 ml RIGHT VENTRICLE RV S prime:     10.20 cm/s TAPSE (M-mode): 2.7 cm LEFT ATRIUM             Index        RIGHT ATRIUM           Index LA diam:        3.20 cm 1.87 cm/m   RA Area:     12.50 cm LA Vol (A2C):   39.8 ml 23.29 ml/m  RA Volume:   30.50 ml  17.85 ml/m LA Vol (A4C):   38.1 ml 22.29 ml/m LA Biplane Vol: 39.0 ml 22.82 ml/m  AORTIC VALVE LVOT Vmax:   66.60 cm/s LVOT Vmean:  46.800 cm/s LVOT VTI:    0.158 m  AORTA Ao Root diam: 3.20 cm MITRAL VALVE MV Area (PHT): 2.73 cm    SHUNTS MV Decel Time: 278 msec    Systemic VTI:  0.16 m MV E velocity: 88.20 cm/s  Systemic Diam: 1.80 cm MV A velocity: 71.70 cm/s MV E/A ratio:  1.23 Jayson Sierras MD Electronically signed by Jayson Sierras MD Signature Date/Time: 06/11/2024/1:23:56 PM    Final     SIGNED: Adriana DELENA Grams, MD, FHM. FAAFP. Jol[facility] - [facility]  Critical care time spent - 55 min.  In seeing, evaluating and examining the patient. Reviewing medical records, labs, drawn plan of care.   [facility],  Pager (please use [email or web address] to page/ text) Please use Epic Secure Chat for non-urgent communication (7AM-7PM)  If 7PM-7AM, please contact night-coverage [email or web address], 06/12/2024, 1:09 PM

## 2024-06-12 NOTE — TOC Initial Note (Addendum)
 Transition of Care [facility]) - Initial/Assessment Note    Patient Details  Name: [name] [name] MRN: [id number] Date of Birth: Oct 14, 1955  [facility] CM/SW Contact:    Sudie Erminio Deems, RN Phone Number: 06/12/2024, 11:58 AM  Clinical Narrative:  Patient presented for recurrent ICD shocks-polymorphic ventricular tachycardia. PTA patient was from home alone and has support of daughter. Daughter was at the bedside during the visit. Patient states she does not use any DME in the home. Patient has concerns that her PCP has just retired and is need of new PCP. Patient wants to see if Dr. Duanne is accepting new patients in [facility]. CMA will call to see if an appointment can be established. ICM received a consult regarding medications. Patient states [facility] was saying that meds could not be refilled since the provider writing had retired. With new PCP; patient should be able to get future medications needed. Awaiting PT/OT consult for additional recommendations. Inpatient Case Manager will continue to follow for additional disposition needs.             1354 06-12-24  CMA made ICM aware that Dr. Duanne is not accepting any new patients. Daughter asked if CMA can check Dr. Lamar Hawthorn office to see if patient can be established.   Expected Discharge Plan: Home w Home Health Services Barriers to Discharge: Continued Medical Work up   Patient Goals and CMS Choice            Expected Discharge Plan and Services In-house Referral: NA Discharge Planning Services: CM Consult Post Acute Care Choice: Home Health Living arrangements for the past 2 months: Apartment                   DME Agency: NA                  Prior Living Arrangements/Services Living arrangements for the past 2 months: Apartment Lives with:: Self Patient language and need for interpreter reviewed:: Yes        Need for Family Participation in Patient Care: No (Comment) Care giver  support system in place?: No (comment)   Criminal Activity/Legal Involvement Pertinent to Current Situation/Hospitalization: No - Comment as needed  Activities of Daily Living   ADL Screening (condition at time of admission) Independently performs ADLs?: Yes (appropriate for developmental age) Is the patient deaf or have difficulty hearing?: No Does the patient have difficulty seeing, even when wearing glasses/contacts?: No Does the patient have difficulty concentrating, remembering, or making decisions?: No  Permission Sought/Granted Permission sought to share information with : Family Supports, Case Manager                Emotional Assessment Appearance:: Appears stated age Attitude/Demeanor/Rapport: Engaged Affect (typically observed): Appropriate Orientation: : Oriented to Self, Oriented to Place, Oriented to  Time, Oriented to Situation Alcohol / Substance Use: Not Applicable Psych Involvement: No (comment)  Admission diagnosis:  Ventricular fibrillation (HCC) [I49.01] Ventricular tachycardia (HCC) [I47.20] Polymorphic ventricular tachycardia (HCC) [I47.29] Patient Active Problem List   Diagnosis Date Noted   Stage 3b chronic kidney disease (HCC) 11/02/2021   Adenomatous colon mass s/p robotic proximal colectomy 05/29/2020 05/29/2020   Preoperative clearance    Anemia 04/22/2020   Polymorphic ventricular tachycardia (HCC) 12/01/2019   SVT (supraventricular tachycardia) 11/13/2012   Dual implantable cardioverter-defibrillator in situ    Ischemic cardiomyopathy    HFrEF (heart failure with reduced ejection fraction) (HCC) 10/19/2010   FATIGUE 10/19/2010   CAD (  coronary artery disease) 01/11/2010   Hyperlipidemia with target low density lipoprotein (LDL) cholesterol less than 70 mg/dL 88/82/7989   DEPRESSION 08/05/2009   GASTROESOPHAGEAL REFLUX DISEASE 08/05/2009   DISC DISEASE, CERVICAL 08/05/2009   PCP:  Pcp, No Pharmacy:   [id number] -  [place], [place] - [address] [place] [address] Phone: [phone] Fax: [phone]     Social Drivers of Health (SDOH) Social History: SDOH Screenings   Food Insecurity: No Food Insecurity (06/10/2024)  Housing: Low Risk  (06/10/2024)  Transportation Needs: No Transportation Needs (06/10/2024)  Utilities: Not At Risk (06/10/2024)  Social Connections: Socially Isolated (06/10/2024)  Tobacco Use: Medium Risk (06/11/2024)   SDOH Interventions:     Readmission Risk Interventions     No data to display

## 2024-06-12 NOTE — Progress Notes (Signed)
 Patient still asymptomatic tried to capture with a 12 lead EKG when heart going between 40-50's non-sustained.  Got at Heart 57.  12 Lead EKG in chart and captured to [facility].  Telemetry monitoring does have the strips in the system as well.

## 2024-06-12 NOTE — Plan of Care (Signed)
 Discussed with patient plan of care for the evening, pain management and medications with some teach back displayed.  Problem: Education: Goal: Knowledge of General Education information will improve Description: Including pain rating scale, medication(s)/side effects and non-pharmacologic comfort measures Outcome: Progressing   Problem: Pain Managment: Goal: General experience of comfort will improve and/or be controlled Outcome: Progressing

## 2024-06-12 NOTE — Progress Notes (Addendum)
 Patient has dropped down to 40's-50's heart rate non-sustained tonight.  Telemetry Monitoring has saved the strips for the MD.  When asked after it happened again patient had no complaints of chest pain, dizziness, headache or any other complaints at this time.  Will continue to monitor.

## 2024-06-13 ENCOUNTER — Encounter (HOSPITAL_COMMUNITY): Payer: Self-pay | Admitting: Cardiology

## 2024-06-13 DIAGNOSIS — I4729 Other ventricular tachycardia: Secondary | ICD-10-CM | POA: Diagnosis not present

## 2024-06-13 LAB — CBC
HCT: 38.8 % (ref 36.0–46.0)
Hemoglobin: 12.6 g/dL (ref 12.0–15.0)
MCH: 29 pg (ref 26.0–34.0)
MCHC: 32.5 g/dL (ref 30.0–36.0)
MCV: 89.2 fL (ref 80.0–100.0)
Platelets: 178 K/uL (ref 150–400)
RBC: 4.35 MIL/uL (ref 3.87–5.11)
RDW: 14.1 % (ref 11.5–15.5)
WBC: 7.4 K/uL (ref 4.0–10.5)
nRBC: 0 % (ref 0.0–0.2)

## 2024-06-13 LAB — BASIC METABOLIC PANEL WITH GFR
Anion gap: 12 (ref 5–15)
BUN: 20 mg/dL (ref 8–23)
CO2: 19 mmol/L — ABNORMAL LOW (ref 22–32)
Calcium: 9.3 mg/dL (ref 8.9–10.3)
Chloride: 105 mmol/L (ref 98–111)
Creatinine, Ser: 1.47 mg/dL — ABNORMAL HIGH (ref 0.44–1.00)
GFR, Estimated: 39 mL/min — ABNORMAL LOW (ref >60)
Glucose, Bld: 91 mg/dL (ref 70–99)
Potassium: 4.3 mmol/L (ref 3.5–5.1)
Sodium: 136 mmol/L (ref 135–145)

## 2024-06-13 MED ORDER — MEXILETINE HCL 150 MG PO CAPS
150.0000 mg | ORAL_CAPSULE | Freq: Two times a day (BID) | ORAL | Status: DC
  Administered 2024-06-13 – 2024-06-14 (×3): 150 mg via ORAL
  Filled 2024-06-13 (×5): qty 1

## 2024-06-13 NOTE — Care Management Important Message (Signed)
 Important Message  Patient Details  Name: [name] [name] MRN: [id number] Date of Birth: May 31, 1956   Important Message Given:  Yes - Medicare IM     Vonzell Arrie Sharps 06/13/2024, 10:35 AM

## 2024-06-13 NOTE — Progress Notes (Signed)
 PROGRESS NOTE    Patient: [name] [name]                            PCP: Pcp, No                    DOB: Apr 11, 1956            DOA: 06/10/2024 FMW:[id number]             DOS: 06/13/2024, 10:30 AM   LOS: 3 days   Date of Service: The patient was seen and examined on 06/13/2024  Subjective:   The patient was seen and examined this morning.  Nausea chest pain shortness of breath Heart rate has been stabilized Status post left cardiac cath, tolerated well IV amiodarone  has been switched to p.o.  Hemodynamically stable.  Heart rate 56  Brief Narrative:    [name] [name] is a 68 y.o. female with medical history significant for congestive heart failure with reduced EF- ICD status, CKD 3.  Patient was called this morning to come to the emergency department after her ICD delivered shocks at about 7 AM this morning for polymorphic VT. happened while asleep, subsequently happened multiple times during the day. Apparently ran out of her medications; including metoprolol , lisinopril , Lasix , aspirin , Crestor . Per patient stopped her medication including amiodarone  for low blood pressure    She also reports persistent headache over the past 3 days, worst she has ever experienced, no visual deficits, no focal weakness of extremities, felt like this morning she was not herself.  Headache has improved.      ED Course: Temperature 98.3.  Heart rate 67-91.  Respiratory rate 11-21.  SBP 108-150.  O2>96% on RA Potassium 4.4. Mag -2.3.  BNP 47. Cardiology was consulted, patient evaluated in ED, recommended admission here.      Assessment & Plan:   Principal Problem:   Polymorphic ventricular tachycardia (HCC) Active Problems:   CAD (coronary artery disease)   HFrEF (heart failure with reduced ejection fraction) (HCC)   Dual implantable cardioverter-defibrillator in situ   Stage 3b chronic kidney disease (HCC)  Assessment and Plan:  Ventricular tachycardia-  - status post ICD firing, shocks  multiple times > 5 times Polymorphic V. tach Recurrent ICD shocks  EP cardiology following Dr. Trudy   02/10/2024-status post left cardiac cath -no severe coronary artery stenosis was noted, no stents  Recommended : continue IV Amiodarone  through this evening, then transition to PO: 400mg  BID x5 days, 200mg  BID x5 days, 200mg  daily To control PVCs EP added  Mexiletine 150mg  BID    -  POA: Ran out of her medication- lisinopril , metoprolol , vascepa , Lasix  20 mg, aspirin  and Crestor  about 3 weeks ago.  Echo 2020 EF of 60 to 65%. Echo: EF 35-40%, NYHA class 1-2 heart failure   - Maintaining K > 4.0, magnesium > 2.0 -replacing accordingly this a.m. - Trop X 2: 13, 13   Hypotension  - Stabilized  Ischemic cardiomyopathy, with acute on chronic HFr EF previously with LVEF 35-40%, later recovered. NYHA class I-II.  Repeat TTE this admission shows LVEF back down to 35-40%  with significant septal wall hypokinesis/akinesis.  Optimizing medical management with heart failure team of HF GDMT.   -Changed Lopressor  to Toprol  XL 100 mg p.o. daily - Consider starting ARB and SGLT2 before discharge. (Not sure if patient is a candidate for  ARB's or SGLT2 due to hypotension, and CKD)  Coronary artery disease-  status post previous anterior wall infarct in 2010 treated with BMS to the LAD.  -Pending repeat left heart cath Continue aspirin , statins  Cephalgia -Resolved -Monitoring closely    CKD 3 -creatinine 1.44.  Stable. Lab Results  Component Value Date   CREATININE 1.47 (H) 06/13/2024   CREATININE 1.49 (H) 06/12/2024   CREATININE 1.55 (H) 06/11/2024        DVT prophylaxis: Lovenox  Code Status: FULL Family Communication: None at bedside  Disposition Plan: 1 days - Home   Consults called: Cardiology Admission status:  Inpt Stepdown I certify that at the point of admission it is my clinical judgment that the patient will require inpatient hospital care spanning beyond 2  midnights from the point of admission due to high intensity of service, high risk for further deterioration and high frequency of surveillance required.     DVT prophylaxis:  heparin  injection 5,000 Units Start: 06/12/24 2200 SCD's Start: 06/12/24 1617 Place and maintain sequential compression device Start: 06/11/24 1943   Code Status:   Code Status: Full Code  Family Communication: No family member present at bedside-  -Advance care planning has been discussed.   Admission status:   Status is: Inpatient Remains inpatient appropriate because: Needing amiodarone  drip for better rate control, defibrillator firing needing EP evaluation, cardiology fo   Disposition: From  - home             Planning for discharge in 1 day - HOME   Procedures:   No admission procedures for hospital encounter.   Antimicrobials:  Anti-infectives (From admission, onward)    None        Medication:   amiodarone   400 mg Oral BID   aspirin  EC  81 mg Oral Daily   heparin   5,000 Units Subcutaneous Q8H   metoprolol  succinate  100 mg Oral QPM   mexiletine  150 mg Oral Q12H   potassium chloride  SA  40 mEq Oral QODAY   rosuvastatin   40 mg Oral Daily   sodium chloride  flush  3 mL Intravenous Q12H    sodium chloride , acetaminophen  **OR** acetaminophen , acetaminophen , nitroGLYCERIN , ondansetron  **OR** ondansetron  (ZOFRAN ) IV, polyethylene glycol, sodium chloride  flush, traMADol    Objective:   Vitals:   06/12/24 2100 06/13/24 0244 06/13/24 0316 06/13/24 0746  BP:  106/60 109/62 (!) 101/59  Pulse: 61 (!) 56 (!) 56 (!) 56  Resp:   19 16  Temp:  98.7 F (37.1 C) 98.7 F (37.1 C) 98.3 F (36.8 C)  TempSrc:  Oral Oral Oral  SpO2: 99% 97% 96% 98%  Weight:      Height:        Intake/Output Summary (Last 24 hours) at 06/13/2024 1030 Last data filed at 06/13/2024 1002 Gross per 24 hour  Intake 1393.04 ml  Output --  Net 1393.04 ml   Filed Weights   06/10/24 1632 06/11/24 1827 06/12/24 0802   Weight: 66 kg 68.3 kg 68.1 kg     Physical examination:     General:  AAO x 3,  cooperative, no distress;   HEENT:  Normocephalic, PERRL, otherwise with in Normal limits   Neuro:  CNII-XII intact. , normal motor and sensation, reflexes intact   Lungs:   Clear to auscultation BL, Respirations unlabored,  No wheezes / crackles  Cardio:    S1/S2, RRR, No murmure, No Rubs or Gallops   Abdomen:  Soft, non-tender, bowel sounds active all four quadrants, no guarding or peritoneal signs.  Muscular  skeletal:  Limited exam -global generalized weaknesses - in bed, able to move all 4 extremities,   2+ pulses,  symmetric, No pitting edema  Skin:  Dry, warm to touch, negative for any Rashes,  Wounds: Please see nursing documentation      ------------------------------------------------------------------------------------------------------------------------    LABs:     Latest Ref Rng & Units 06/13/2024    7:38 AM 06/12/2024    6:27 PM 06/11/2024    4:54 AM  CBC  WBC 4.0 - 10.5 K/uL 7.4  9.0  6.9   Hemoglobin 12.0 - 15.0 g/dL 87.3  86.8  86.6   Hematocrit 36.0 - 46.0 % 38.8  41.5  41.3   Platelets 150 - 400 K/uL 178  165  187       Latest Ref Rng & Units 06/13/2024    7:38 AM 06/12/2024    6:27 PM 06/11/2024    4:54 AM  CMP  Glucose 70 - 99 mg/dL 91   899   BUN 8 - 23 mg/dL 20   27   Creatinine 9.55 - 1.00 mg/dL 8.52  8.50  8.44   Sodium 135 - 145 mmol/L 136   138   Potassium 3.5 - 5.1 mmol/L 4.3   3.8   Chloride 98 - 111 mmol/L 105   106   CO2 22 - 32 mmol/L 19   21   Calcium  8.9 - 10.3 mg/dL 9.3   9.3        Micro Results Recent Results (from the past 240 hours)  MRSA Next Gen by PCR, Nasal     Status: None   Collection Time: 06/10/24  4:23 PM   Specimen: Nasal Mucosa; Nasal Swab  Result Value Ref Range Status   MRSA by PCR Next Gen NOT DETECTED NOT DETECTED Final    Comment: (NOTE) The GeneXpert MRSA Assay (FDA approved for NASAL specimens only), is one component  of a comprehensive MRSA colonization surveillance program. It is not intended to diagnose MRSA infection nor to guide or monitor treatment for MRSA infections. Test performance is not FDA approved in patients less than 14 years old. Performed at [facility], [address] [place], [place] [address]     Radiology Reports CARDIAC CATHETERIZATION Result Date: 06/12/2024 Images from the original result were not included. Coronary angiography 06/12/2024: LM: Normal LAD: Patent prox-mid stent with only 20-30% late lumen loss Lcx: Large dominant vessel with mild OM2, mid OM disease        Severe diffuse disease in small caliber distal Lcx/OM branches RCA: Small nondominant vessel with mild diffuse disease LVEDP 26 mmHg Conclusion: No severe proximal epicardial disease Diffuse disease in distal Lcx/OM branches not amenable to PCI and unlikely to be the cause of low EF Continue GMDT for HFrEF and polymorphic VT Manish JINNY Lawrence, MD    SIGNED: Adriana DELENA Grams, MD, FHM. FAAFP. [facility] - [facility]  Critical care time spent - 55 min.  In seeing, evaluating and examining the patient. Reviewing medical records, labs, drawn plan of care.   [facility],  Pager (please use [email or web address] to page/ text) Please use Epic Secure Chat for non-urgent communication (7AM-7PM)  If 7PM-7AM, please contact night-coverage [email or web address], 06/13/2024, 10:30 AM

## 2024-06-13 NOTE — Plan of Care (Signed)
  Problem: Education: Goal: Knowledge of General Education information will improve Description: Including pain rating scale, medication(s)/side effects and non-pharmacologic comfort measures Outcome: Progressing   Problem: Health Behavior/Discharge Planning: Goal: Ability to manage health-related needs will improve Outcome: Progressing   Problem: Clinical Measurements: Goal: Ability to maintain clinical measurements within normal limits will improve Outcome: Progressing Goal: Will remain free from infection Outcome: Progressing Goal: Diagnostic test results will improve Outcome: Progressing Goal: Respiratory complications will improve Outcome: Progressing Goal: Cardiovascular complication will be avoided Outcome: Progressing   Problem: Activity: Goal: Risk for activity intolerance will decrease Outcome: Progressing   Problem: Nutrition: Goal: Adequate nutrition will be maintained Outcome: Progressing   Problem: Coping: Goal: Level of anxiety will decrease Outcome: Progressing   Problem: Elimination: Goal: Will not experience complications related to bowel motility Outcome: Progressing Goal: Will not experience complications related to urinary retention Outcome: Progressing   Problem: Pain Managment: Goal: General experience of comfort will improve and/or be controlled Outcome: Progressing   Problem: Safety: Goal: Ability to remain free from injury will improve Outcome: Progressing   Problem: Skin Integrity: Goal: Risk for impaired skin integrity will decrease Outcome: Progressing   Problem: Education: Goal: Understanding of CV disease, CV risk reduction, and recovery process will improve Outcome: Progressing Goal: Individualized Educational Video(s) Outcome: Progressing   Problem: Activity: Goal: Ability to return to baseline activity level will improve Outcome: Progressing   Problem: Cardiovascular: Goal: Ability to achieve and maintain adequate  cardiovascular perfusion will improve Outcome: Progressing Goal: Vascular access site(s) Level 0-1 will be maintained Outcome: Progressing   Problem: Health Behavior/Discharge Planning: Goal: Ability to safely manage health-related needs after discharge will improve Outcome: Progressing   Problem: Education: Goal: Understanding of CV disease, CV risk reduction, and recovery process will improve Outcome: Progressing Goal: Individualized Educational Video(s) Outcome: Progressing   Problem: Activity: Goal: Ability to return to baseline activity level will improve Outcome: Progressing   Problem: Cardiovascular: Goal: Ability to achieve and maintain adequate cardiovascular perfusion will improve Outcome: Progressing Goal: Vascular access site(s) Level 0-1 will be maintained Outcome: Progressing   Problem: Health Behavior/Discharge Planning: Goal: Ability to safely manage health-related needs after discharge will improve Outcome: Progressing

## 2024-06-13 NOTE — Progress Notes (Addendum)
 Patient Name: [name] [name] Date of Encounter: 06/13/2024  Primary Cardiologist: Ozell Fell, MD Electrophysiologist: Elspeth Sage, MD  Interval Summary   The patient is doing well today.  At this time, the patient denies chest pain, shortness of breath, or any new concerns. Review results of catheterization.  Vital Signs    Vitals:   06/12/24 1912 06/12/24 2100 06/13/24 0244 06/13/24 0316  BP:   106/60 109/62  Pulse:  61 (!) 56 (!) 56  Resp:    19  Temp: 97.6 F (36.4 C)  98.7 F (37.1 C) 98.7 F (37.1 C)  TempSrc: Oral  Oral Oral  SpO2:  99% 97% 96%  Weight:      Height:        Intake/Output Summary (Last 24 hours) at 06/13/2024 0707 Last data filed at 06/13/2024 0511 Gross per 24 hour  Intake 1390.04 ml  Output --  Net 1390.04 ml   Filed Weights   06/10/24 1632 06/11/24 1827 06/12/24 0802  Weight: 66 kg 68.3 kg 68.1 kg    Physical Exam    GEN- NAD, Alert and oriented  Lungs- Clear to ausculation bilaterally, normal work of breathing Cardiac- Regular rate and rhythm, no murmurs, rubs or gallops GI- soft, NT, ND, + BS Extremities- no clubbing or cyanosis. No edema  Telemetry    Sinus rhythm/sinus bradycardia (HR 50s-60s) with isolated close coupled PVCs. (personally reviewed)  Hospital Course    [name] [name] is a 68 y.o. female with a history of CAD (s/p anterior MI in 2010 tx with PCI (BMS) to LAD, ischemic cardiomyopathy/HFrEF (EF 60-65% in 2020), VT/SVT s/p (ICD [employer] implanted for primary prevention 06/2010, gen change 11/2017), HLD, CKD, GERD, hx of GI bleed in 04/2020 who is being seen for the evaluation of ICD shocks.  Patient advised to present to the ED on 9/22 after remote report from her device indicated 13 seconds with polymorphic VT in the VF zone, HR 400, terminated with 25 J of HV therapy. Also noted 9 additional VF events, 2 terminated with 1 burst of ATP, longest duration 1 min 16 sec with HR 231-353. Also noted 346 logged NSVT with 793  logged high rate NSVT. Patient reportedly asleep at the time of 7:06 am shock. Patient shocked again 7:52am. She was contacted by device clinic and advised to present to the ED. Patient without cardiac GDMT for at least 3-4 weeks after running out of medications.   Following admission, device reports shows at least 5 additional ICD shocks on 9/22 and patient was started on IV amiodarone  on evening of 9/22. Telemetry at [facility] showed recurrent bursts of polymorphic VT as well. Patient transferred to [facility] for further evaluation and management.  Assessment & Plan    Ischemic cardiomyopathy (recovered EF) s/p ICD Polymorphic VT Recurrent ICD shocks Multiple rounds of ATP and at least 5 shocks for PMVT/VF in patient with ischemic cardiomyopathy, recent medication non-compliance. Will complete 48 hours IV amiodarone  as of this evening. No recurrent VT since transfer. LHC with no severe proximal epicardial disease. Transition to PO: 400mg  BID x5 days, 200mg  BID x5 days, 200mg  daily. Given close coupled PVCs, will add Mexiletine 150mg  BID. Plan to observe patient today on above therapies. Will reprogram patient's device to ensure that all VT/VF therapies with AX>B pathway are corrected to B>AX.  CAD Anterior MI in 2010 tx with PCI (BMS) to LAD. No recent chest pain per patient. Troponin negative x2 even with ICD shocks. LHC with patent LAD  stent, diffuse disease in distal LCX/OM branches (not amenable to PCI and not felt to be contributing to reduced LVEF). Continue ASA 81mg  daily Continue high intensity statin.  Acute on chronic HFrEF Patient with ischemic cardiomyopathy previously with LVEF 35-40%, later recovered. NYHA class I-II. Repeat TTE this admission shows LVEF back down to 35-40% with significant septal wall hypokinesis/akinesis.  Will need optimization of HF GDMT. Lopressor  has been consolidated to Toprol  XL. Patient previously on ACEi. Consider starting ARB (to facilitate potential  transition to ARNI in the future) and SGLT2 today if renal function stable. Labs pending. Would defer MRA to outpatient follow up given contrast this admission.    For questions or updates, please contact [facility] Please consult [email or web address] for contact info under     Signed, Artist Pouch, PA-C  06/13/2024, 7:07 AM

## 2024-06-14 ENCOUNTER — Telehealth: Payer: Self-pay | Admitting: Cardiovascular Disease

## 2024-06-14 ENCOUNTER — Telehealth (HOSPITAL_COMMUNITY): Payer: Self-pay | Admitting: Pharmacy Technician

## 2024-06-14 ENCOUNTER — Other Ambulatory Visit (HOSPITAL_COMMUNITY): Payer: Self-pay

## 2024-06-14 DIAGNOSIS — I4729 Other ventricular tachycardia: Secondary | ICD-10-CM | POA: Diagnosis not present

## 2024-06-14 LAB — BASIC METABOLIC PANEL WITH GFR
Anion gap: 11 (ref 5–15)
BUN: 25 mg/dL — ABNORMAL HIGH (ref 8–23)
CO2: 22 mmol/L (ref 22–32)
Calcium: 9.6 mg/dL (ref 8.9–10.3)
Chloride: 103 mmol/L (ref 98–111)
Creatinine, Ser: 1.76 mg/dL — ABNORMAL HIGH (ref 0.44–1.00)
GFR, Estimated: 32 mL/min — ABNORMAL LOW (ref >60)
Glucose, Bld: 100 mg/dL — ABNORMAL HIGH (ref 70–99)
Potassium: 4.4 mmol/L (ref 3.5–5.1)
Sodium: 136 mmol/L (ref 135–145)

## 2024-06-14 LAB — MAGNESIUM: Magnesium: 2 mg/dL (ref 1.7–2.4)

## 2024-06-14 LAB — HEMOGLOBIN A1C
Hgb A1c MFr Bld: 5.5 % (ref 4.8–5.6)
Mean Plasma Glucose: 111.15 mg/dL

## 2024-06-14 MED ORDER — AMIODARONE HCL 200 MG PO TABS
200.0000 mg | ORAL_TABLET | Freq: Two times a day (BID) | ORAL | 0 refills | Status: DC
  Filled 2024-06-14: qty 10, 5d supply, fill #0

## 2024-06-14 MED ORDER — AMIODARONE HCL 200 MG PO TABS
ORAL_TABLET | ORAL | 0 refills | Status: DC
  Filled 2024-06-14: qty 56, 39d supply, fill #0

## 2024-06-14 MED ORDER — METOPROLOL SUCCINATE ER 100 MG PO TB24
100.0000 mg | ORAL_TABLET | Freq: Every evening | ORAL | 0 refills | Status: DC
  Filled 2024-06-14: qty 60, 60d supply, fill #0

## 2024-06-14 MED ORDER — ROSUVASTATIN CALCIUM 40 MG PO TABS: 40.0000 mg | ORAL_TABLET | Freq: Every day | ORAL | 0 refills | Status: DC

## 2024-06-14 MED ORDER — AMIODARONE HCL 200 MG PO TABS: 200.0000 mg | ORAL_TABLET | Freq: Two times a day (BID) | ORAL | Status: DC

## 2024-06-14 MED ORDER — NITROGLYCERIN 0.4 MG SL SUBL: 0.4000 mg | SUBLINGUAL_TABLET | SUBLINGUAL | 0 refills | Status: AC | PRN

## 2024-06-14 MED ORDER — AMIODARONE HCL 200 MG PO TABS: 200.0000 mg | ORAL_TABLET | Freq: Every day | ORAL | Status: DC

## 2024-06-14 MED ORDER — AMIODARONE HCL 400 MG PO TABS
400.0000 mg | ORAL_TABLET | Freq: Two times a day (BID) | ORAL | 0 refills | Status: DC
  Filled 2024-06-14: qty 7, 4d supply, fill #0

## 2024-06-14 MED ORDER — MEXILETINE HCL 150 MG PO CAPS
150.0000 mg | ORAL_CAPSULE | Freq: Two times a day (BID) | ORAL | 0 refills | Status: DC
  Filled 2024-06-14: qty 60, 30d supply, fill #0

## 2024-06-14 NOTE — Progress Notes (Signed)
 Discharge   Patient verbally understands discharge POC.  PIV and tele removed by primary RN.  Patient's TOC meds are not ready due to insurance.  Per primary RN. Patient's daughter will return before 6pm to pick up medication.    Son at bedside. Volunteer called to [facility].

## 2024-06-14 NOTE — Telephone Encounter (Signed)
 Called patient and notified her of the two medications that we were able to fill and patient verbalized understanding. Patient stated that she doesn't have a PCP.

## 2024-06-14 NOTE — Discharge Summary (Signed)
 Physician Discharge Summary  [name] [name] FMW:[id number] DOB: 17-Dec-1955 DOA: 06/10/2024  PCP: Pcp, No  Admit date: 06/10/2024 Discharge date: 06/14/2024  Admitted From: Home Disposition:  Home  Discharge Condition:Stable CODE STATUS:FULL Diet recommendation: Heart Healthy   Brief/Interim Summary: [name] [name] is a 68 y.o. female with medical history significant for congestive heart failure with reduced EF- ICD status, CKD 3 who was called this morning to come to the emergency department after her ICD delivered shocks .Happened while asleep, subsequently happened multiple times during the day. Apparently ran out of her medications; including metoprolol , lisinopril , Lasix , aspirin , Crestor . Cardiology was consulted on admission.  EP followed here yet.  Started on IV amiodarone , transitioned to oral.  Underwent left heart cath on 9/24 with patent LAD stent, diffuse disease in distal LCx/OM branches . She currently in normal sinus rhythm.  Cardiology cleared for discharge on home oral amiodarone , also added mexiletine.  She will follow-up with EP as an outpatient.  Medically stable for discharge home today.  Following problems were addressed during the hospitalization:  Ventricular tachycardia-  - status post ICD firing, shocks multiple times > 5 times Polymorphic V. tach Recurrent ICD shocks Started on IV amiodarone , transitioned to oral.  Continue mexiletine, metoprolol   Hypotension  - Blood pressure stable today   Ischemic cardiomyopathy, with acute on chronic HFr EF/coronary artery  disease previously with LVEF 35-40%, later recovered. NYHA class I-II.  Repeat TTE this admission shows LVEF back down to 35-40%  with significant septal wall hypokinesis/akinesis.  Underwent left heart cath on 9/24 with patent LAD stent, diffuse disease in distal LCx/OM branches.  No anginal symptoms. She will be considered starting ARB and SGLT2 at outpatient follow-up with cardiology.   Continue  aspirin , statins    CKD 3 -currently kidney.  Close to baseline  Discharge Diagnoses:  Principal Problem:   Polymorphic ventricular tachycardia (HCC) Active Problems:   CAD (coronary artery disease)   HFrEF (heart failure with reduced ejection fraction) (HCC)   Dual implantable cardioverter-defibrillator in situ   Stage 3b chronic kidney disease [facility])    Discharge Instructions  Discharge Instructions     Diet - low sodium heart healthy   Complete by: As directed    Discharge instructions   Complete by: As directed    1)Please take your medications as instructed 2)Follow up with your PCP in a week 3)You will be called by cardiology for follow up appointment   Increase activity slowly   Complete by: As directed       Allergies as of 06/14/2024   No Known Allergies      Medication List     STOP taking these medications    metoprolol  tartrate 50 MG tablet Commonly known as: LOPRESSOR        TAKE these medications    acetaminophen  325 MG tablet Commonly known as: TYLENOL  Take 650 mg by mouth every 6 (six) hours as needed for mild pain or moderate pain.   amiodarone  400 MG tablet Commonly known as: PACERONE  Take 1 tablet (400 mg total) by mouth 2 (two) times daily for 4 days.   amiodarone  200 MG tablet Commonly known as: PACERONE  Take 1 tablet (200 mg total) by mouth 2 (two) times daily for 5 days. Start taking on: June 18, 2024   amiodarone  200 MG tablet Commonly known as: PACERONE  Take 1 tablet (200 mg total) by mouth daily. Start taking on: June 23, 2024   aspirin  EC 81 MG tablet Take 1 tablet (  81 mg total) by mouth daily.   DULoxetine  60 MG capsule Commonly known as: CYMBALTA  Take 60 mg by mouth daily.   levothyroxine 25 MCG tablet Commonly known as: SYNTHROID Take 25 mcg by mouth daily.   metoprolol  succinate 100 MG 24 hr tablet Commonly known as: TOPROL -XL Take 1 tablet (100 mg total) by mouth every evening. Take with or immediately  following a meal.   mexiletine 150 MG capsule Commonly known as: MEXITIL  Take 1 capsule (150 mg total) by mouth every 12 (twelve) hours.   nitroGLYCERIN  0.4 MG SL tablet Commonly known as: NITROSTAT  Place 0.4 mg under the tongue every 5 (five) minutes x 3 doses as needed for chest pain.   pantoprazole  40 MG tablet Commonly known as: PROTONIX  TAKE 1 TABLET(40 MG) BY MOUTH DAILY   rosuvastatin  40 MG tablet Commonly known as: CRESTOR  TAKE 1 TABLET BY MOUTH EVERY DAY        Follow-up Information     Beam, Lamar POUR, MD Follow up.   Specialty: Family Medicine Why: TIME : 3:00 PM   PLEASE ARRIVE  AT  2:30 PM DATE:  June 24, 2024   MONDAY  PLEASE BRING ALL CURRENT MEDICATION, ID and  INS CARD Contact information: [address] [place] [address] [phone]                No Known Allergies  Consultations: Cardiology   Procedures/Studies: CARDIAC CATHETERIZATION Result Date: 06/12/2024 Images from the original result were not included. Coronary angiography 06/12/2024: LM: Normal LAD: Patent prox-mid stent with only 20-30% late lumen loss Lcx: Large dominant vessel with mild OM2, mid OM disease        Severe diffuse disease in small caliber distal Lcx/OM branches RCA: Small nondominant vessel with mild diffuse disease LVEDP 26 mmHg Conclusion: No severe proximal epicardial disease Diffuse disease in distal Lcx/OM branches not amenable to PCI and unlikely to be the cause of low EF Continue GMDT for HFrEF and polymorphic VT Newman JINNY Lawrence, MD   ECHOCARDIOGRAM COMPLETE Result Date: 06/11/2024    ECHOCARDIOGRAM REPORT   [name] Date of Exam: 06/11/2024 Medical Rec #:  [id number]    Height:       64.0 in Accession #:    [id number]   Weight:       145.5 lb Date of Birth:  06/25/1956     BSA:          1.709 m Patient Age:    68 years     BP:           94/56 mmHg Patient Gender: F            HR:           54 bpm. Exam Location:  [facility]  Procedure: 2D Echo, 3D Echo, Cardiac Doppler, Color Doppler and Intracardiac            Opacification Agent (Both Spectral and Color Flow Doppler were            utilized during procedure). Indications:    Ventricualr Tachycardia l47.2  History:        Patient has prior history of Echocardiogram examinations, most                 recent 07/31/2019. Cardiomyopathy, CAD, Defibrillator; Risk                 Factors:Dyslipidemia. Hx of CKD.  Sonographer:    Aida  White RCS Referring Phys: [id number] LORETTE GRADE DUNLAP IMPRESSIONS  1. Left ventricular ejection fraction, by estimation, is 35 to 40%. The left ventricle has moderately decreased function. The left ventricle demonstrates regional wall motion abnormalities (see scoring diagram/findings for description). Left ventricular  diastolic parameters were normal.  2. No formed LV mural thrombus evident by Definity  contrast. Transverse false tendon at apex.  3. Right ventricular systolic function is normal. The right ventricular size is normal. Tricuspid regurgitation signal is inadequate for assessing PA pressure.  4. The mitral valve is degenerative. Trivial mitral valve regurgitation.  5. The aortic valve was not well visualized. There is mild calcification of the aortic valve. Aortic valve regurgitation is not visualized.  6. The inferior vena cava is normal in size with greater than 50% respiratory variability, suggesting right atrial pressure of 3 mmHg. Comparison(s): Prior images reviewed side by side. LVEF 35-40% with wall motion abnormalties consistent with ischemic cardiomyopathy. FINDINGS  Left Ventricle: Left ventricular ejection fraction, by estimation, is 35 to 40%. The left ventricle has moderately decreased function. The left ventricle demonstrates regional wall motion abnormalities. Definity  contrast agent was given IV to delineate the left ventricular endocardial borders. The left ventricular internal cavity size was normal in size. There is borderline  concentric left ventricular hypertrophy. Left ventricular diastolic parameters were normal.  LV Wall Scoring: The entire anterior septum, mid inferoseptal segment, apical anterior segment, apical inferior segment, and apex are akinetic. The inferior wall and basal inferoseptal segment are hypokinetic. The anterior wall and entire lateral wall are normal. Right Ventricle: The right ventricular size is normal. No increase in right ventricular wall thickness. Right ventricular systolic function is normal. Tricuspid regurgitation signal is inadequate for assessing PA pressure. Left Atrium: Left atrial size was normal in size. Right Atrium: Right atrial size was normal in size. Pericardium: There is no evidence of pericardial effusion. Mitral Valve: The mitral valve is degenerative in appearance. Trivial mitral valve regurgitation. Tricuspid Valve: The tricuspid valve is grossly normal. Tricuspid valve regurgitation is trivial. Aortic Valve: The aortic valve was not well visualized. There is mild calcification of the aortic valve. There is mild aortic valve annular calcification. Aortic valve regurgitation is not visualized. Pulmonic Valve: The pulmonic valve was not well visualized. Pulmonic valve regurgitation is trivial. Aorta: The aortic root is normal in size and structure. Venous: The inferior vena cava is normal in size with greater than 50% respiratory variability, suggesting right atrial pressure of 3 mmHg. IAS/Shunts: No atrial level shunt detected by color flow Doppler. Additional Comments: 3D was performed not requiring image post processing on an independent workstation and was indeterminate. A device lead is visualized.  LEFT VENTRICLE PLAX 2D LVIDd:         4.30 cm   Diastology LVIDs:         3.20 cm   LV e' medial:    8.08 cm/s LV PW:         1.00 cm   LV E/e' medial:  10.9 LV IVS:        1.00 cm   LV e' lateral:   10.10 cm/s LVOT diam:     1.80 cm   LV E/e' lateral: 8.7 LV SV:         40 LV SV Index:    24 LVOT Area:     2.54 cm  3D Volume EF:                          LV EDV:       120 ml                          LV ESV:       43 ml                          LV SV:        77 ml RIGHT VENTRICLE RV S prime:     10.20 cm/s TAPSE (M-mode): 2.7 cm LEFT ATRIUM             Index        RIGHT ATRIUM           Index LA diam:        3.20 cm 1.87 cm/m   RA Area:     12.50 cm LA Vol (A2C):   39.8 ml 23.29 ml/m  RA Volume:   30.50 ml  17.85 ml/m LA Vol (A4C):   38.1 ml 22.29 ml/m LA Biplane Vol: 39.0 ml 22.82 ml/m  AORTIC VALVE LVOT Vmax:   66.60 cm/s LVOT Vmean:  46.800 cm/s LVOT VTI:    0.158 m  AORTA Ao Root diam: 3.20 cm MITRAL VALVE MV Area (PHT): 2.73 cm    SHUNTS MV Decel Time: 278 msec    Systemic VTI:  0.16 m MV E velocity: 88.20 cm/s  Systemic Diam: 1.80 cm MV A velocity: 71.70 cm/s MV E/A ratio:  1.23 Jayson Sierras MD Electronically signed by Jayson Sierras MD Signature Date/Time: 06/11/2024/1:23:56 PM    Final    CT HEAD WO CONTRAST ( ) Result Date: 06/11/2024 CLINICAL DATA:  Persistent headache- 3 days. EXAM: CT HEAD WITHOUT CONTRAST TECHNIQUE: Contiguous axial images were obtained from the base of the skull through the vertex without intravenous contrast. RADIATION DOSE REDUCTION: This exam was performed according to the departmental dose-optimization program which includes automated exposure control, adjustment of the mA and/or kV according to patient size and/or use of iterative reconstruction technique. COMPARISON:  CT head Jan 31, 2004 FINDINGS: Brain: No evidence of acute infarction, hemorrhage, hydrocephalus, extra-axial collection or mass lesion/mass effect. Vascular: No hyperdense vessel. Skull: No acute fracture. Sinuses/Orbits: Clear sinuses.  No acute orbital findings. Other: No mastoid effusions. IMPRESSION: No evidence of acute intracranial abnormality. Electronically Signed   By: Gilmore GORMAN Molt M.D.   On: 06/11/2024 03:34   DG CHEST PORT 1 VIEW Result  Date: 06/10/2024 EXAM: 1 VIEW(S) XRAY OF THE CHEST 06/10/2024 04:41:00 PM COMPARISON: None available. CLINICAL HISTORY: Patient presents with headache. More notably, the patient was informed today from her medical device rep that her implanted defibrillator discharged. She notes that over the past 3 days she has had headache, without focal neuro complaints, with relief with Tylenol . She has been feeling weaker than usual over the past few days as well. FINDINGS: LUNGS AND PLEURA: No focal pulmonary opacity. No pulmonary edema. No pleural effusion. No pneumothorax. HEART AND MEDIASTINUM: Left chest wall single lead cardiac pacemaker noted. Atherosclerotic plaque noted. BONES AND SOFT TISSUES: Partially visualized ACDF hardware noted. No acute osseous abnormality. IMPRESSION: 1. No acute findings. Electronically signed by: Dorethia Molt MD 06/10/2024 08:31 PM EDT RP Workstation: [id number]   CUP PACEART REMOTE DEVICE CHECK Result Date: 05/23/2024 ICD Scheduled remote reviewed.  Normal device function.  Presenting rhythm:  Regular VS Some VF/FVT/VT therapies have 1 or more AX>B pathways programmed. AX>B pathway is not recommended for this device. 2 NSVT, HR's 195-213, each 2sec in duration Next remote 91 days. LA, CVRS     Subjective: Patient seen and examined at bedside today.  Hemodynamically stable.  Overall comfortable.  EKG monitor showed normal sinus rhythm.  Blood pressure stable.  Eager to go home.  Medically stable for discharge today  Discharge Exam: Vitals:   06/14/24 0408 06/14/24 0905  BP: 120/63 (!) 110/53  Pulse:    Resp: 18 18  Temp: 97.9 F (36.6 C) 98.3 F (36.8 C)  SpO2:     Vitals:   06/13/24 1600 06/13/24 2347 06/14/24 0408 06/14/24 0905  BP: 104/60 (!) 107/52 120/63 (!) 110/53  Pulse:  (!) 58    Resp: 18 17 18 18   Temp: 98.4 F (36.9 C) 97.6 F (36.4 C) 97.9 F (36.6 C) 98.3 F (36.8 C)  TempSrc: Oral Oral Oral Oral  SpO2:  98%    Weight:      Height:         General: Pt is alert, awake, not in acute distress Cardiovascular: RRR, S1/S2 +, no rubs, no gallops Respiratory: CTA bilaterally, no wheezing, no rhonchi Abdominal: Soft, NT, ND, bowel sounds + Extremities: no edema, no cyanosis    The results of significant diagnostics from this hospitalization (including imaging, microbiology, ancillary and laboratory) are listed below for reference.     Microbiology: Recent Results (from the past 240 hours)  MRSA Next Gen by PCR, Nasal     Status: None   Collection Time: 06/10/24  4:23 PM   Specimen: Nasal Mucosa; Nasal Swab  Result Value Ref Range Status   MRSA by PCR Next Gen NOT DETECTED NOT DETECTED Final    Comment: (NOTE) The GeneXpert MRSA Assay (FDA approved for NASAL specimens only), is one component of a comprehensive MRSA colonization surveillance program. It is not intended to diagnose MRSA infection nor to guide or monitor treatment for MRSA infections. Test performance is not FDA approved in patients less than 39 years old. Performed at [facility], [address], [place], [place] [address]      Labs: BNP (last 3 results) Recent Labs    06/10/24 1330  BNP 47.0   Basic Metabolic Panel: Recent Labs  Lab 06/10/24 1330 06/11/24 0454 06/12/24 1827 06/13/24 0738 06/14/24 1017  NA 137 138  --  136 136  K 4.4 3.8  --  4.3 4.4  CL 104 106  --  105 103  CO2 21* 21*  --  19* 22  GLUCOSE 98 100*  --  91 100*  BUN 21 27*  --  20 25*  CREATININE 1.44* 1.55* 1.49* 1.47* 1.76*  CALCIUM  9.7 9.3  --  9.3 9.6  MG 2.3 2.2  --   --  2.0   Liver Function Tests: Recent Labs  Lab 06/10/24 1330  AST 38  ALT 43  ALKPHOS 135*  BILITOT 0.7  PROT 7.9  ALBUMIN 4.2   No results for input(s): LIPASE, AMYLASE in the last 168 hours. No results for input(s): AMMONIA in the last 168 hours. CBC: Recent Labs  Lab 06/10/24 1330 06/11/24 0454 06/12/24 1827 06/13/24 0738  WBC 7.2 6.9 9.0 7.4  NEUTROABS 4.4  --   --   --    HGB 13.3 13.3 13.1 12.6  HCT 41.9 41.3 41.5 38.8  MCV 91.5 91.2  90.8 89.2  PLT 174 187 165 178   Cardiac Enzymes: No results for input(s): CKTOTAL, CKMB, CKMBINDEX, TROPONINI in the last 168 hours. BNP: Invalid input(s): POCBNP CBG: No results for input(s): GLUCAP in the last 168 hours. D-Dimer No results for input(s): DDIMER in the last 72 hours. Hgb A1c Recent Labs    06/14/24 1017  HGBA1C 5.5   Lipid Profile No results for input(s): CHOL, HDL, LDLCALC, TRIG, CHOLHDL, LDLDIRECT in the last 72 hours. Thyroid  function studies No results for input(s): TSH, T4TOTAL, T3FREE, THYROIDAB in the last 72 hours.  Invalid input(s): FREET3 Anemia work up No results for input(s): VITAMINB12, FOLATE, FERRITIN, TIBC, IRON, RETICCTPCT in the last 72 hours. Urinalysis No results found for: COLORURINE, APPEARANCEUR, LABSPEC, PHURINE, GLUCOSEU, HGBUR, BILIRUBINUR, KETONESUR, PROTEINUR, UROBILINOGEN, NITRITE, LEUKOCYTESUR Sepsis Labs Recent Labs  Lab 06/10/24 1330 06/11/24 0454 06/12/24 1827 06/13/24 0738  WBC 7.2 6.9 9.0 7.4   Microbiology Recent Results (from the past 240 hours)  MRSA Next Gen by PCR, Nasal     Status: None   Collection Time: 06/10/24  4:23 PM   Specimen: Nasal Mucosa; Nasal Swab  Result Value Ref Range Status   MRSA by PCR Next Gen NOT DETECTED NOT DETECTED Final    Comment: (NOTE) The GeneXpert MRSA Assay (FDA approved for NASAL specimens only), is one component of a comprehensive MRSA colonization surveillance program. It is not intended to diagnose MRSA infection nor to guide or monitor treatment for MRSA infections. Test performance is not FDA approved in patients less than 74 years old. Performed at [facility], [address] [place], [place] [address]     Please note: You were cared for by a hospitalist during your hospital stay. Once you are discharged, your primary care  physician will handle any further medical issues. Please note that NO REFILLS for any discharge medications will be authorized once you are discharged, as it is imperative that you return to your primary care physician (or establish a relationship with a primary care physician if you do not have one) for your post hospital discharge needs so that they can reassess your need for medications and monitor your lab values.    Time coordinating discharge: 40 minutes  SIGNED:   Ivonne Mustache, MD  [facility] 06/14/2024, 12:33 PM Pager [phone]  If 7PM-7AM, please contact night-coverage [email or web address] Password [email or web address]

## 2024-06-14 NOTE — Telephone Encounter (Signed)
*  STAT* If patient is at the pharmacy, call can be transferred to refill team.   1. Which medications need to be refilled? (please list name of each medication and dose if known)   nitroGLYCERIN  (NITROSTAT ) 0.4 MG SL tablet  pantoprazole  (PROTONIX ) 40 MG tablet  rosuvastatin  (CRESTOR ) 40 MG tablet  levothyroxine (SYNTHROID) 25 MCG tablet   potassium chloride  SA (KLOR-CON  M) CR tablet 40 mEq    2. Would you like to learn more about the convenience, safety, & potential cost savings by using the [facility]? No   3. Are you open to using the [facility] (Type [facility]. ) No   4. Which pharmacy/location (including street and city if local pharmacy) is medication to be sent to?  [id number] - , [place] - [address]   5. Do they need a 30 day or 90 day supply? 90 day  Pt is out of medication

## 2024-06-14 NOTE — Plan of Care (Signed)

## 2024-06-14 NOTE — Telephone Encounter (Signed)
 Patient Product/process development scientist completed.    The patient is insured through [employer]. Patient has Medicare and is not eligible for a copay card, but may be able to apply for patient assistance or Medicare RX Payment Plan (Patient Must reach out to their plan, if eligible for payment plan), if available.    Ran test claim for sacubitril-valsartan (Entresto) 24-26 mg and the current 30 day co-pay is $10.00.  Ran test claim for Farxgia 10 mg and the current 30 day co-pay is $64.00.  Ran test claim for Jardiance 10 mg and the current 30 day co-pay is $40.00.  This test claim was processed through [facility]- copay amounts may vary at other pharmacies due to pharmacy/plan contracts, or as the patient moves through the different stages of their insurance plan.     Reyes Sharps, CPHT Pharmacy Technician III Certified Patient Advocate [facility] Patient Advocate Team Direct Number: [phone]  Fax: [phone]

## 2024-06-14 NOTE — Progress Notes (Signed)
 Patient Name: [name] [name] Date of Encounter: 06/14/2024  Primary Cardiologist: [name] Fell, MD Electrophysiologist: [name] Sage, MD  Interval Summary   The patient is doing well today.  At this time, the patient denies chest pain, shortness of breath, or any [name] concerns. Looking forward to discharging today.  Vital Signs    Vitals:   06/13/24 1202 06/13/24 1600 06/13/24 2347 06/14/24 0408  BP: 117/89 104/60 (!) 107/52 120/63  Pulse: 64  (!) 58   Resp: 17 18 17 18   Temp: 98.1 F (36.7 C) 98.4 F (36.9 C) 97.6 F (36.4 C) 97.9 F (36.6 C)  TempSrc: Oral Oral Oral Oral  SpO2: 99%  98%   Weight:      Height:        Intake/Output Summary (Last 24 hours) at 06/14/2024 0743 Last data filed at 06/14/2024 0300 Gross per 24 hour  Intake 279.82 ml  Output --  Net 279.82 ml   Filed Weights   06/10/24 1632 06/11/24 1827 06/12/24 0802  Weight: 66 kg 68.3 kg 68.1 kg    Physical Exam    GEN- NAD, Alert and oriented  Lungs- normal work of breathing Cardiac- Regular rate and rhythm, no murmurs, rubs or gallops GI- soft, NT, ND, + BS Extremities- no clubbing or cyanosis. No edema  Telemetry    Sinus rhythm/sinus bradycardia with isolated close coupled PVCs (personally reviewed)  [name] Course    [name] [name] is a 68 y.o. female with a history of CAD (s/p anterior MI in 2010 tx with PCI (BMS) to LAD, ischemic cardiomyopathy/HFrEF (EF 60-65% in 2020), VT/SVT s/p (ICD Medtronic implanted for primary prevention 06/2010, gen change 11/2017), HLD, CKD, GERD, hx of GI bleed in 04/2020 who is being seen for the evaluation of ICD shocks.   Patient advised to present to the ED on 9/22 after remote report from her device indicated 13 seconds with polymorphic VT in the VF zone, HR 400, terminated with 25 J of HV therapy. Also noted 9 additional VF events, 2 terminated with 1 burst of ATP, longest duration 1 min 16 sec with HR 231-353. Also noted 346 logged NSVT with 793 logged high  rate NSVT. Patient reportedly asleep at the time of 7:06 am shock. Patient shocked again 7:52am. She was contacted by device clinic and advised to present to the ED. Patient without cardiac GDMT for at least 3-4 weeks after running out of medications.    Following admission, device reports shows at least 5 additional ICD shocks on 9/22 and patient was started on IV amiodarone  on evening of 9/22. Telemetry at [name] [name] showed recurrent bursts of polymorphic VT as well. Patient transferred to [name] [name] for further evaluation and management.   Assessment & Plan    Ischemic cardiomyopathy (recovered EF) s/p ICD Polymorphic VT Recurrent ICD shocks Multiple rounds of ATP and at least 5 shocks for PMVT/VF in patient with ischemic cardiomyopathy, recent medication non-compliance. Will complete 48 hours IV amiodarone  as of this evening. LHC with no severe proximal epicardial disease. Patient's tele remains quiet with no recurrent VT. At discharge, continue Amiodarone  400mg  BID x4 days, then 200mg  BID x5 days, then 200mg  daily. Decreased frequency of close coupled PVCs after starting Mexiletine 150mg  BID. Continue at discharge. VT/VF therapies with AX>B pathway have been corrected to B>AX. Outpatient EP follow up scheduled.   CAD Anterior MI in 2010 tx with PCI (BMS) to LAD. No recent chest pain per patient. Troponin negative x2 even with ICD shocks. LHC  with patent LAD stent, diffuse disease in distal LCX/OM branches (not amenable to PCI and not felt to be contributing to reduced LVEF). Continue ASA 81mg  daily Continue high intensity statin. Outpatient follow up with cardiology scheduled.   Acute on chronic HFrEF Patient with ischemic cardiomyopathy previously with LVEF 35-40%, later recovered. NYHA class I-II. Repeat TTE this admission shows LVEF back down to 35-40% with significant septal wall hypokinesis/akinesis.  Continue to optimize HF GDMT. Lopressor  has been consolidated to Toprol  XL. Patient  previously on ACEi. With stable renal function today, would consider starting ARB (to facilitate potential transition to ARNI in the future) and SGLT2.   [name] [name] will sign off.   The patient is ready for discharge today from a cardiac standpoint. Medication Recommendations:  Continue Amiodarone  400mg  BID x4 days, then 200mg  BID x5 days, then 200mg  daily. Mexiletine 150mg  BID. Consider starting ARB and SGLT2 for HFrEF. Other recommendations (labs, testing, etc):  n/a Follow up as an outpatient:  EP and cardiology follow up scheduled. For questions or updates, please contact [name] [name] Please consult [email or web address] for contact info under     Signed, [name] Pouch, PA-C  06/14/2024, 7:43 AM

## 2024-06-15 LAB — LIPOPROTEIN A (LPA): Lipoprotein (a): 58.3 nmol/L — ABNORMAL HIGH (ref <75.0)

## 2024-06-17 NOTE — Telephone Encounter (Signed)
 Mychart message sent with several options of PCP within [facility].

## 2024-06-26 ENCOUNTER — Telehealth: Payer: Self-pay | Admitting: Cardiovascular Disease

## 2024-06-26 NOTE — Progress Notes (Signed)
 Cardiology Office Note    Date:  06/29/2024  ID:  [name] [name], DOB 1956-05-13, MRN [id number] PCP:  Freddrick, No  Cardiologist:  Ozell Fell, MD  Electrophysiologist:  Elspeth Sage, MD   Chief Complaint: Follow up for HFrEF   History of Present Illness: .    [name] [name] is a 68 y.o. female with visit-pertinent history of CAD s/p anterior MI in 2010 treated with PCI (BMS to LAD), ischemic cardiomyopathy/HFrEF, VT/SVT s/p ICD with Medtronic implanted for primary prevention in 06/2010 with generator change in 11/2017, hyperlipidemia, CKD, GERD, history of GI bleed in 04/2020.  Patient was last seen in office in 12/2022 with Dr. Sage for follow-up.  At that time patient was doing well from a cardiac standpoint.  On 06/10/2024 patient had alert from ICD for an event lasting 13 seconds with polymorphic VT and VF zone, heart rate 400, terminated with 25 J of HV therapy.  Also noted 9 additional SVT events, 2 terminated with 1 burst of ATP, longest duration 1 minute and 16 seconds with heart rate from 231-353.  Also noted 3 and 46 logged in SVT with 793 logged high rate in SVT.  Patient was contacted and reported she was asleep during time of shock but did note she did not feel yourself that morning.  ED visit was encouraged.  On interview in the ED patient reported she had not felt any shocks at home but did feel shocks that occurred in the ED.  She reported she been out of all of her meds for at least 3 to 4 weeks to running out and not making follow-up appointments to get refills.  Following admission device report showed at least 5 additional ICD shocks on 9/22, patient with start IV amiodarone .  Patient was transferred to [facility] from [facility] for further evaluation.  Patient evaluated by EP, continued on amiodarone  and also started on mexiletine.  Patient's troponins were negative x 2 even with ICD shocks.  LHC with patent LAD stent, diffuse disease in distal LCx/OM branches that were not amenable  to PCI and not felt to be contributing to reduced LVEF.  Today she presents for hospital follow-up.  She reports that she has been doing very well overall.  She denies any chest pain, shortness of breath, lower extremity edema, orthopnea or PND.  She denies any increased palpitations or feeling of irregular heartbeats.  Patient reports that she was notified of elevated potassium level however did not present to the emergency department.  She denies any dizziness, lightheadedness, presyncope or syncope, denies any problems with urination.  Labwork independently reviewed: 06/24/2024: Hemoglobin 13.4, hematocrit 41.5, creatinine 2.01, sodium 140, potassium 6.1, calcium  10.7, AST 29, ALT 43, TSH 7.44 ROS: .   Today she denies chest pain, shortness of breath, lower extremity edema, fatigue, palpitations, melena, hematuria, hemoptysis, diaphoresis, weakness, presyncope, syncope, orthopnea, and PND.  All other systems are reviewed and otherwise negative. Studies Reviewed: SABRA   EKG:  EKG is ordered today, personally reviewed, demonstrating  EKG Interpretation Date/Time:  Thursday June 27 2024 14:02:51 EDT Ventricular Rate:  54 PR Interval:  164 QRS Duration:  90 QT Interval:  428 QTC Calculation: 405 R Axis:   68  Text Interpretation: Sinus bradycardia When compared with ECG of 12-Jun-2024 05:21, Sinus rhythm has replaced Electronic ventricular pacemaker Confirmed by Kazumi Lachney [id number]) on 06/29/2024 1:18:37 PM   CV Studies: Cardiac studies reviewed are outlined and summarized above. Otherwise please see EMR for full  report. Cardiac Studies & Procedures   ______________________________________________________________________________________________ CARDIAC CATHETERIZATION  CARDIAC CATHETERIZATION 06/12/2024  Conclusion Images from the original result were not included. Coronary angiography 06/12/2024: LM: Normal LAD: Patent prox-mid stent with only 20-30% late lumen loss Lcx: Large dominant  vessel with mild OM2, mid OM disease Severe diffuse disease in small caliber distal Lcx/OM branches RCA: Small nondominant vessel with mild diffuse disease  LVEDP 26 mmHg    Conclusion: No severe proximal epicardial disease Diffuse disease in distal Lcx/OM branches not amenable to PCI and unlikely to be the cause of low EF  Continue GMDT for HFrEF and polymorphic VT  Manish JINNY Lawrence, MD  Findings Coronary Findings Diagnostic  Dominance: Left  Left Anterior Descending Prox LAD to Mid LAD lesion is 30% stenosed. The lesion was previously treated .  Left Circumflex Dist Cx lesion is 40% stenosed.  Second Obtuse Marginal Branch 2nd Mrg lesion is 30% stenosed.  Left Posterior Atrioventricular Artery LPAV lesion is 70% stenosed with 80% stenosed side branch in LPDA.  Right Coronary Artery Vessel is small. There is mild diffuse disease throughout the vessel.  Intervention  No interventions have been documented.     ECHOCARDIOGRAM  ECHOCARDIOGRAM COMPLETE 06/11/2024  Narrative ECHOCARDIOGRAM REPORT    [name] Date of Exam: 06/11/2024 Medical Rec #:  [id number]    Height:       64.0 in Accession #:    [id number]   Weight:       145.5 lb Date of Birth:  05/01/1956     BSA:          1.709 m Patient Age:    68 years     BP:           94/56 mmHg Patient Gender: F            HR:           54 bpm. Exam Location:  [facility]  Procedure: 2D Echo, 3D Echo, Cardiac Doppler, Color Doppler and Intracardiac Opacification Agent (Both Spectral and Color Flow Doppler were utilized during procedure).  Indications:    Ventricualr Tachycardia l47.2  History:        Patient has prior history of Echocardiogram examinations, most recent 07/31/2019. Cardiomyopathy, CAD, Defibrillator; Risk Factors:Dyslipidemia. Hx of CKD.  Sonographer:    Aida Pizza RCS Referring Phys: [id number] LORETTE CINDERELLA KAPUR  IMPRESSIONS   1. Left ventricular ejection fraction, by  estimation, is 35 to 40%. The left ventricle has moderately decreased function. The left ventricle demonstrates regional wall motion abnormalities (see scoring diagram/findings for description). Left ventricular diastolic parameters were normal. 2. No formed LV mural thrombus evident by Definity  contrast. Transverse false tendon at apex. 3. Right ventricular systolic function is normal. The right ventricular size is normal. Tricuspid regurgitation signal is inadequate for assessing PA pressure. 4. The mitral valve is degenerative. Trivial mitral valve regurgitation. 5. The aortic valve was not well visualized. There is mild calcification of the aortic valve. Aortic valve regurgitation is not visualized. 6. The inferior vena cava is normal in size with greater than 50% respiratory variability, suggesting right atrial pressure of 3 mmHg.  Comparison(s): Prior images reviewed side by side. LVEF 35-40% with wall motion abnormalties consistent with ischemic cardiomyopathy.  FINDINGS Left Ventricle: Left ventricular ejection fraction, by estimation, is 35 to 40%. The left ventricle has moderately decreased function. The left ventricle demonstrates regional wall motion abnormalities. Definity  contrast agent was given IV to delineate the left  ventricular endocardial borders. The left ventricular internal cavity size was normal in size. There is borderline concentric left ventricular hypertrophy. Left ventricular diastolic parameters were normal.   LV Wall Scoring: The entire anterior septum, mid inferoseptal segment, apical anterior segment, apical inferior segment, and apex are akinetic. The inferior wall and basal inferoseptal segment are hypokinetic. The anterior wall and entire lateral wall are normal.  Right Ventricle: The right ventricular size is normal. No increase in right ventricular wall thickness. Right ventricular systolic function is normal. Tricuspid regurgitation signal is inadequate for  assessing PA pressure.  Left Atrium: Left atrial size was normal in size.  Right Atrium: Right atrial size was normal in size.  Pericardium: There is no evidence of pericardial effusion.  Mitral Valve: The mitral valve is degenerative in appearance. Trivial mitral valve regurgitation.  Tricuspid Valve: The tricuspid valve is grossly normal. Tricuspid valve regurgitation is trivial.  Aortic Valve: The aortic valve was not well visualized. There is mild calcification of the aortic valve. There is mild aortic valve annular calcification. Aortic valve regurgitation is not visualized.  Pulmonic Valve: The pulmonic valve was not well visualized. Pulmonic valve regurgitation is trivial.  Aorta: The aortic root is normal in size and structure.  Venous: The inferior vena cava is normal in size with greater than 50% respiratory variability, suggesting right atrial pressure of 3 mmHg.  IAS/Shunts: No atrial level shunt detected by color flow Doppler.  Additional Comments: 3D was performed not requiring image post processing on an independent workstation and was indeterminate. A device lead is visualized.   LEFT VENTRICLE PLAX 2D LVIDd:         4.30 cm   Diastology LVIDs:         3.20 cm   LV e' medial:    8.08 cm/s LV PW:         1.00 cm   LV E/e' medial:  10.9 LV IVS:        1.00 cm   LV e' lateral:   10.10 cm/s LVOT diam:     1.80 cm   LV E/e' lateral: 8.7 LV SV:         40 LV SV Index:   24 LVOT Area:     2.54 cm  3D Volume EF: LV EDV:       120 ml LV ESV:       43 ml LV SV:        77 ml  RIGHT VENTRICLE RV S prime:     10.20 cm/s TAPSE (M-mode): 2.7 cm  LEFT ATRIUM             Index        RIGHT ATRIUM           Index LA diam:        3.20 cm 1.87 cm/m   RA Area:     12.50 cm LA Vol (A2C):   39.8 ml 23.29 ml/m  RA Volume:   30.50 ml  17.85 ml/m LA Vol (A4C):   38.1 ml 22.29 ml/m LA Biplane Vol: 39.0 ml 22.82 ml/m AORTIC VALVE LVOT Vmax:   66.60 cm/s LVOT Vmean:   46.800 cm/s LVOT VTI:    0.158 m  AORTA Ao Root diam: 3.20 cm  MITRAL VALVE MV Area (PHT): 2.73 cm    SHUNTS MV Decel Time: 278 msec    Systemic VTI:  0.16 m MV E velocity: 88.20 cm/s  Systemic Diam: 1.80 cm MV A velocity: 71.70  cm/s MV E/A ratio:  1.23  Jayson Sierras MD Electronically signed by Jayson Sierras MD Signature Date/Time: 06/11/2024/1:23:56 PM    Final          ______________________________________________________________________________________________       Current Reported Medications:.    Current Meds  Medication Sig   acetaminophen  (TYLENOL ) 325 MG tablet Take 650 mg by mouth every 6 (six) hours as needed for mild pain or moderate pain.   amiodarone  (PACERONE ) 200 MG tablet Take 1 tablet (200 mg total) by mouth 2 (two) times daily for 5 days.   amiodarone  (PACERONE ) 200 MG tablet Take 2 tablets (400 mg total) by mouth in the morning and at bedtime for 4 days, THEN 1 tablet (200 mg total) in the morning and at bedtime for 5 days, THEN 1 tablet (200 mg total) daily.   amiodarone  (PACERONE ) 400 MG tablet Take 1 tablet (400 mg total) by mouth 2 (two) times daily for 4 days.   aspirin  EC 81 MG tablet Take 1 tablet (81 mg total) by mouth daily.   DULoxetine  (CYMBALTA ) 60 MG capsule Take 60 mg by mouth daily.    levothyroxine (SYNTHROID) 25 MCG tablet Take 25 mcg by mouth daily.   metoprolol  succinate (TOPROL -XL) 100 MG 24 hr tablet Take 1 tablet (100 mg total) by mouth every evening. Take with or immediately following a meal.   mexiletine (MEXITIL ) 150 MG capsule Take 1 capsule (150 mg total) by mouth every 12 (twelve) hours.   nitroGLYCERIN  (NITROSTAT ) 0.4 MG SL tablet Place 1 tablet (0.4 mg total) under the tongue every 5 (five) minutes x 3 doses as needed for chest pain.   pantoprazole  (PROTONIX ) 40 MG tablet Take 1 tablet (40 mg total) by mouth daily.   rosuvastatin  (CRESTOR ) 40 MG tablet Take 1 tablet (40 mg total) by mouth daily.   sodium zirconium  cyclosilicate (LOKELMA) 10 g PACK packet Take 10 g by mouth as needed (daily for elevated potassium to take if directed).   sodium zirconium cyclosilicate (LOKELMA) 10 g PACK packet Take 10 g by mouth daily.   [DISCONTINUED] pantoprazole  (PROTONIX ) 40 MG tablet Take 1 tablet (40 mg total) by mouth daily.   [DISCONTINUED] rosuvastatin  (CRESTOR ) 40 MG tablet Take 1 tablet (40 mg total) by mouth daily.    Physical Exam:    VS:  BP (!) 129/56 (BP Location: Left Arm, Patient Position: Sitting, Cuff Size: Normal)   Pulse (!) 56   Resp 16   Ht 5' 4 (1.626 m)   Wt 149 lb 12.8 oz (67.9 kg)   SpO2 97%   BMI 25.71 kg/m    Wt Readings from Last 3 Encounters:  06/27/24 149 lb 12.8 oz (67.9 kg)  06/12/24 150 lb 2.1 oz (68.1 kg)  12/27/22 149 lb 3.2 oz (67.7 kg)    GEN: Well nourished, well developed in no acute distress NECK: No JVD; No carotid bruits CARDIAC: RRR, no murmurs, rubs, gallops RESPIRATORY:  Clear to auscultation without rales, wheezing or rhonchi  ABDOMEN: Soft, non-tender, non-distended EXTREMITIES:  No edema; No acute deformity     Asessement and Plan:.    CAD: Anterior MI in 2010 treated with PCI (BMS) to LAD.  LHC in 05/2024 with patent LAD stent, diffuse disease in distal LCx/OM branches that were not amenable to PCI not felt to be contributing to reduced LVEF. Stable with no anginal symptoms. Heart healthy diet and regular cardiovascular exercise encouraged.  Cath site is clean and intact without evidence of hematoma.  Reviewed ED precautions.  Continue aspirin  81 mg daily, metoprolol  succinate 100 mg daily, Crestor  40 mg daily.  Hyperkalemia/?AKI: Patient potassium level earlier this week at 6.1, creatinine 2.01.  Patient previously directed to the emergency department, she declined ED evaluation.  Check stat BMET.  Reviewed ED precautions.  Instructed patient to avoid any form of supplements, stay hydrated and decrease intake of high potassium foods and nephrotoxic  agents.  HFrEF: Patient with ischemic cardiomyopathy previously with LVEF 35 to 40%, later recovered.  TTE in 05/2024 showed LVEF back down to 35 to 40% with significant septal wall hypokinesis/akinesis.  Patient denies any shortness of breath, lower extremity edema, orthopnea or PND.  She appears euvolemic and well compensated.  Continue Toprol  XL, given elevated potassium and creatinine level will defer escalation of GDMT until normalization and close follow up.  Reviewed ED precautions. Check stat BMET.   Polymorphic VT: s/p ICD, patient admitted in 05/2024 after multiple rounds of ATP and at least 5 shocks for PM VT/V-fib and patient with history of ischemic cardiomyopathy at time of shocks with medication noncompliance.  Patient was started on amiodarone  as well as mexiletine.  Patient denies any palpitations or feeling of irregular rhythms, denies any feelings of shock.  She will have close follow-up with EP.  Continue amiodarone  and mexiletine.   HLD: Last blood profile in 06/24/2024 indicated total cholesterol 134, triglycerides 194, HDL 39 and LDL 63.  Continue Crestor  40 mg daily.   Disposition: F/u with Starling Jessie, NP in three weeks.   Signed, Evalene Vath D Marshall Roehrich, NP

## 2024-06-26 NOTE — Telephone Encounter (Signed)
 Informed EC that the best course of action is to follow the ordering provider's advice and go to the ER to be seen. The ER can get lab work back much quicker than our regular labs and then they can treat the patient- as needed- according to the results; quickly.  She verbalized understanding.

## 2024-06-26 NOTE — Telephone Encounter (Signed)
 Pts daughter calling to state her mother went to a pcp visit 10/6 and had an elevated Potassium level 6.1. They advised she go to the ED but pt refused. Pts daughter would like to know if there is anything that can be done about potassium levels until pts appt tomorrow 10/9. Please advise.

## 2024-06-27 ENCOUNTER — Encounter: Payer: Self-pay | Admitting: Cardiology

## 2024-06-27 ENCOUNTER — Other Ambulatory Visit (HOSPITAL_COMMUNITY): Payer: Self-pay

## 2024-06-27 ENCOUNTER — Other Ambulatory Visit: Payer: Self-pay

## 2024-06-27 ENCOUNTER — Ambulatory Visit: Payer: Self-pay | Admitting: Cardiology

## 2024-06-27 ENCOUNTER — Ambulatory Visit: Attending: Cardiology | Admitting: Cardiology

## 2024-06-27 VITALS — BP 129/56 | HR 56 | Resp 16 | Ht 64.0 in | Wt 149.8 lb

## 2024-06-27 DIAGNOSIS — I2510 Atherosclerotic heart disease of native coronary artery without angina pectoris: Secondary | ICD-10-CM | POA: Diagnosis not present

## 2024-06-27 DIAGNOSIS — I471 Supraventricular tachycardia, unspecified: Secondary | ICD-10-CM

## 2024-06-27 DIAGNOSIS — Z95810 Presence of automatic (implantable) cardiac defibrillator: Secondary | ICD-10-CM

## 2024-06-27 DIAGNOSIS — E875 Hyperkalemia: Secondary | ICD-10-CM

## 2024-06-27 DIAGNOSIS — I472 Ventricular tachycardia, unspecified: Secondary | ICD-10-CM | POA: Diagnosis not present

## 2024-06-27 DIAGNOSIS — I5020 Unspecified systolic (congestive) heart failure: Secondary | ICD-10-CM

## 2024-06-27 DIAGNOSIS — I255 Ischemic cardiomyopathy: Secondary | ICD-10-CM

## 2024-06-27 LAB — BASIC METABOLIC PANEL WITH GFR
BUN/Creatinine Ratio: 8 — ABNORMAL LOW (ref 12–28)
BUN: 14 mg/dL (ref 8–27)
CO2: 24 mmol/L (ref 20–29)
Calcium: 10.3 mg/dL (ref 8.7–10.3)
Chloride: 98 mmol/L (ref 96–106)
Creatinine, Ser: 1.82 mg/dL — ABNORMAL HIGH (ref 0.57–1.00)
Glucose: 97 mg/dL (ref 70–99)
Potassium: 5.7 mmol/L — ABNORMAL HIGH (ref 3.5–5.2)
Sodium: 134 mmol/L (ref 134–144)
eGFR: 30 mL/min/1.73 — ABNORMAL LOW (ref >59)

## 2024-06-27 MED ORDER — LOKELMA 10 G PO PACK: 10.0000 g | PACK | ORAL | Status: DC | PRN

## 2024-06-27 MED ORDER — PANTOPRAZOLE SODIUM 40 MG PO TBEC: 40.0000 mg | DELAYED_RELEASE_TABLET | Freq: Every day | ORAL | 3 refills | Status: AC

## 2024-06-27 MED ORDER — ROSUVASTATIN CALCIUM 40 MG PO TABS
40.0000 mg | ORAL_TABLET | Freq: Every day | ORAL | 3 refills | Status: DC
  Filled 2024-06-27: qty 90, 90d supply, fill #0

## 2024-06-27 MED ORDER — PANTOPRAZOLE SODIUM 40 MG PO TBEC
40.0000 mg | DELAYED_RELEASE_TABLET | Freq: Every day | ORAL | 3 refills | Status: DC
  Filled 2024-06-27: qty 90, 90d supply, fill #0

## 2024-06-27 MED ORDER — ROSUVASTATIN CALCIUM 40 MG PO TABS: 40.0000 mg | ORAL_TABLET | Freq: Every day | ORAL | 3 refills | Status: DC

## 2024-06-27 NOTE — Patient Instructions (Signed)
 Medication Instructions:  SAMPLES GIVEN of Lokelma to take if directed   *If you need a refill on your cardiac medications before your next appointment, please call your pharmacy*  Lab Work: BMP (STAT)  If you have labs (blood work) drawn today and your tests are completely normal, you will receive your results only by: MyChart Message (if you have MyChart) OR A paper copy in the mail If you have any lab test that is abnormal or we need to change your treatment, we will call you to review the results.    Follow-Up: At [facility], you and your health needs are our priority.  As part of our continuing mission to provide you with exceptional heart care, our providers are all part of one team.  This team includes your primary Cardiologist (physician) and Advanced Practice Providers or APPs (Physician Assistants and Nurse Practitioners) who all work together to provide you with the care you need, when you need it.  Your next appointment:   3 week(s)  Provider:   Katlyn West, NP

## 2024-06-28 ENCOUNTER — Other Ambulatory Visit: Payer: Self-pay

## 2024-06-28 DIAGNOSIS — E875 Hyperkalemia: Secondary | ICD-10-CM

## 2024-06-28 MED ORDER — LOKELMA 10 G PO PACK: 10.0000 g | PACK | Freq: Every day | ORAL | Status: DC

## 2024-06-29 ENCOUNTER — Ambulatory Visit: Payer: Self-pay | Admitting: Cardiology

## 2024-06-29 ENCOUNTER — Encounter: Payer: Self-pay | Admitting: Cardiology

## 2024-06-29 LAB — BASIC METABOLIC PANEL WITH GFR
BUN/Creatinine Ratio: 9 — ABNORMAL LOW (ref 12–28)
BUN: 15 mg/dL (ref 8–27)
CO2: 22 mmol/L (ref 20–29)
Calcium: 9.7 mg/dL (ref 8.7–10.3)
Chloride: 97 mmol/L (ref 96–106)
Creatinine, Ser: 1.59 mg/dL — ABNORMAL HIGH (ref 0.57–1.00)
Glucose: 101 mg/dL — ABNORMAL HIGH (ref 70–99)
Potassium: 4.6 mmol/L (ref 3.5–5.2)
Sodium: 135 mmol/L (ref 134–144)
eGFR: 36 mL/min/1.73 — ABNORMAL LOW (ref >59)

## 2024-07-02 ENCOUNTER — Encounter: Payer: Self-pay | Admitting: Student

## 2024-07-02 ENCOUNTER — Ambulatory Visit: Attending: Student | Admitting: Student

## 2024-07-02 VITALS — BP 139/71 | HR 48 | Ht 64.0 in | Wt 148.2 lb

## 2024-07-02 DIAGNOSIS — I5022 Chronic systolic (congestive) heart failure: Secondary | ICD-10-CM

## 2024-07-02 DIAGNOSIS — I10 Essential (primary) hypertension: Secondary | ICD-10-CM

## 2024-07-02 DIAGNOSIS — Z9189 Other specified personal risk factors, not elsewhere classified: Secondary | ICD-10-CM | POA: Diagnosis not present

## 2024-07-02 DIAGNOSIS — Z79899 Other long term (current) drug therapy: Secondary | ICD-10-CM | POA: Diagnosis not present

## 2024-07-02 DIAGNOSIS — I2510 Atherosclerotic heart disease of native coronary artery without angina pectoris: Secondary | ICD-10-CM | POA: Diagnosis not present

## 2024-07-02 LAB — CUP PACEART INCLINIC DEVICE CHECK
Battery Remaining Longevity: 17 mo
Battery Voltage: 2.97 V
Brady Statistic RV Percent Paced: 0.19 %
Date Time Interrogation Session: 20251014110514
HighPow Impedance: 76 Ohm
Implantable Lead Connection Status: 753985
Implantable Lead Implant Date: 20110110
Implantable Lead Location: 753860
Implantable Lead Model: 6935
Implantable Pulse Generator Implant Date: 20190301
Lead Channel Impedance Value: 494 Ohm
Lead Channel Impedance Value: 570 Ohm
Lead Channel Pacing Threshold Amplitude: 0.75 V
Lead Channel Pacing Threshold Pulse Width: 0.4 ms
Lead Channel Sensing Intrinsic Amplitude: 23.75 mV
Lead Channel Sensing Intrinsic Amplitude: 23.75 mV
Lead Channel Setting Pacing Amplitude: 2.5 V
Lead Channel Setting Pacing Pulse Width: 0.4 ms
Lead Channel Setting Sensing Sensitivity: 0.3 mV
Zone Setting Status: 755011

## 2024-07-02 NOTE — Progress Notes (Signed)
  Electrophysiology Office Note:   ID:  [name] [name], [name] [name] 19-Jan-1956, MRN [id number]  Primary Cardiologist: Ozell Fell, MD Electrophysiologist: Soyla Gladis Norton, MD      History of Present Illness:   [name] [name] is a 68 y.o. female with h/o CAD (s/p anterior MI in 2010 tx with PCI (BMS) to LAD, ischemic cardiomyopathy/HFrEF (EF 60-65% in 2020), VT/SVT s/p (ICD Medtronic implanted for primary prevention 06/2010, gen change 11/2017), HLD, CKD, GERD, hx of GI bleed in 04/2020 seen today for post hospital follow up.    Admitted 9/22 - 9/26 with A/C CHF and VT with multiple shocks. Loaded on IV amio and underwent cath without intervention as below. Started on mexitil  with close coupled PVCs  Since discharge from hospital the patient reports doing well. No further arrhythmia of which she is aware, or by device interrogation. Some fatigue. No edema, SOB, or palpitations. Was seen in ED for elevated K which has resolved on follow up labs. ACE/ARB held.   Review of systems complete and found to be negative unless listed in HPI.   EP Information / Studies Reviewed:    EKG is ordered today. Personal review as below.  EKG Interpretation Date/Time:  Tuesday July 02 2024 09:14:27 EDT Ventricular Rate:  48 PR Interval:  172 QRS Duration:  86 QT Interval:  450 QTC Calculation: 402 R Axis:   74  Text Interpretation: Sinus bradycardia Septal infarct , age undetermined When compared with ECG of 27-Jun-2024 14:02, No significant change was found Confirmed by Lesia Ozell [phone]) on 07/02/2024 9:21:15 AM    ICD Interrogation-  reviewed in detail today,  See PACEART report.  Arrhythmia/Device History Medtronic single chamber ICD, implanted 09/28/2009   Physical Exam:   VS:  BP 139/71   Pulse (!) 48   Ht 5' 4 (1.626 m)   Wt 148 lb 3.2 oz (67.2 kg)   SpO2 97%   BMI 25.44 kg/m    Wt Readings from Last 3 Encounters:  07/02/24 148 lb 3.2 oz (67.2 kg)  06/27/24 149 lb 12.8 oz (67.9 kg)   06/12/24 150 lb 2.1 oz (68.1 kg)     GEN: No acute distress  NECK: No JVD; No carotid bruits CARDIAC: Regular rate and rhythm, no murmurs, rubs, gallops RESPIRATORY:  Clear to auscultation without rales, wheezing or rhonchi  ABDOMEN: Soft, non-tender, non-distended EXTREMITIES:  No edema; No deformity   ASSESSMENT AND PLAN:    Chronic systolic CHF  s/p Medtronic single chamber ICD  Polymorphic VT Recurrent ICD shocks euvolemic today Normal ICD function See Pace Art report No changes today No further VT.  Continue amiodarone  200 mg daily Continue mexitil  150 mg BID  CAD Cath 06/12/2024 with no severe proximal epicardial disease. Distal disease not felt amenable to PCI No s/s of ischemia.      Chronic systolic CHF EF 35-40% 06/11/2024 Continue toprol  100 mg daily ACE/Arb held with AKI Sees gen cards this month to continue working on GDMT.  May be limited with labile Cr and K.   Disposition:   Follow up with EP Team in 3 months   Signed, Ozell Prentice Lesia, PA-C

## 2024-07-02 NOTE — Patient Instructions (Signed)
 Medication Instructions:  Your physician recommends that you continue on your current medications as directed. Please refer to the Current Medication list given to you today.  *If you need a refill on your cardiac medications before your next appointment, please call your pharmacy*  Lab Work: TSH, FreeT4-TODAY If you have labs (blood work) drawn today and your tests are completely normal, you will receive your results only by: MyChart Message (if you have MyChart) OR A paper copy in the mail If you have any lab test that is abnormal or we need to change your treatment, we will call you to review the results.  Follow-Up: At [facility], you and your health needs are our priority.  As part of our continuing mission to provide you with exceptional heart care, our providers are all part of one team.  This team includes your primary Cardiologist (physician) and Advanced Practice Providers or APPs (Physician Assistants and Nurse Practitioners) who all work together to provide you with the care you need, when you need it.  Your next appointment:   3 month(s)  Provider:   Ozell Jodie Passey, PA-C

## 2024-07-03 ENCOUNTER — Ambulatory Visit: Payer: Self-pay | Admitting: Student

## 2024-07-03 LAB — TSH: TSH: 5.67 u[IU]/mL — ABNORMAL HIGH (ref 0.450–4.500)

## 2024-07-03 LAB — T4, FREE: Free T4: 1.73 ng/dL (ref 0.82–1.77)

## 2024-07-10 ENCOUNTER — Ambulatory Visit: Admitting: Physician Assistant

## 2024-07-11 ENCOUNTER — Telehealth: Payer: Self-pay | Admitting: Student

## 2024-07-11 MED ORDER — MEXILETINE HCL 150 MG PO CAPS: 150.0000 mg | ORAL_CAPSULE | Freq: Two times a day (BID) | ORAL | 3 refills | Status: AC

## 2024-07-11 MED ORDER — METOPROLOL SUCCINATE ER 100 MG PO TB24: 100.0000 mg | ORAL_TABLET | Freq: Every evening | ORAL | 3 refills | Status: AC

## 2024-07-11 MED ORDER — AMIODARONE HCL 200 MG PO TABS: 200.0000 mg | ORAL_TABLET | Freq: Two times a day (BID) | ORAL | 3 refills | Status: AC

## 2024-07-11 NOTE — Telephone Encounter (Signed)
 RX sent in

## 2024-07-11 NOTE — Telephone Encounter (Signed)
*  STAT* If patient is at the pharmacy, call can be transferred to refill team.   1. Which medications need to be refilled? (please list name of each medication and dose if known)   mexiletine (MEXITIL ) 150 MG capsule   amiodarone  (PACERONE ) 200 MG tablet   metoprolol  succinate (TOPROL -XL) 100 MG 24 hr tablet         2. Would you like to learn more about the convenience, safety, & potential cost savings by using the [facility]? no    3. Are you open to using the [facility] (Type [facility]. no ).   4. Which pharmacy/location (including street and city if local pharmacy) is medication to be sent to? [id number] - [place], [place] - [address] AT [address]     5. Do they need a 30 day or 90 day supply? 90 day if approved    Pt is out of medication

## 2024-07-18 NOTE — Progress Notes (Unsigned)
 Cardiology Office Note    Date:  07/19/2024  ID:  [name] [name], DOB 11-26-1955, MRN [id number] PCP:  Freddrick, No  Cardiologist:  Ozell Fell, MD  Electrophysiologist:  Soyla Gladis Norton, MD   Chief Complaint: Follow up for HFrEF   History of Present Illness: .    [name] [name] is a 68 y.o. female with visit-pertinent history of CAD s/p anterior MI in 2010 treated with PCI (BMS to LAD), ischemic cardiomyopathy/HFrEF, VT/SVT s/p ICD with Medtronic implanted for primary prevention in 06/2010 with generator change in 11/2017, hyperlipidemia, CKD, GERD, history of GI bleed in 04/2020.  Patient was last seen in office in 12/2022 with Dr. Fernande for follow-up.  At that time patient was doing well from a cardiac standpoint.  On 06/10/2024 patient had alert from ICD for an event lasting 13 seconds with polymorphic VT and VF zone, heart rate 400, terminated with 25 J of HV therapy.  Also noted 9 additional SVT events, 2 terminated with 1 burst of ATP, longest duration 1 minute and 16 seconds with heart rate from 231-353.  Also noted 3 and 46 logged in SVT with 793 logged high rate in SVT.  Patient was contacted and reported she was asleep during time of shock but did note she did not feel yourself that morning.  ED visit was encouraged.  On interview in the ED patient reported she had not felt any shocks at home but did feel shocks that occurred in the ED.  She reported she been out of all of her meds for at least 3 to 4 weeks to running out and not making follow-up appointments to get refills.  Following admission device report showed at least 5 additional ICD shocks on 9/22, patient with start IV amiodarone .  Patient was transferred to [facility] from [facility] for further evaluation.  Patient evaluated by EP, continued on amiodarone  and also started on mexiletine.  Patient's troponins were negative x 2 even with ICD shocks.  LHC with patent LAD stent, diffuse disease in distal LCx/OM branches that were not  amenable to PCI and not felt to be contributing to reduced LVEF.  Patient seen in clinic on 06/27/2024 for hospital follow-up, had been seen by her PCP earlier in the week and noted to have a significantly elevated potassium level however did not present to the emergency room.  Stat BMP checked, potassium was 5.7, she was given 2 packets of Lokelma with improvement.   Today she presents for follow-up.  She reports that she has been doing very well overall.  She denies any chest pain, shortness of breath, lower extremity edema, orthopnea or PND.  She denies any palpitations, presyncope or syncope.  Patient notes that she walks her 3 talks every day and tolerates very well.  She reports that she has been monitoring her blood pressure at home and has not that her systolic blood pressure typically stays at 140.  Labwork independently reviewed: 06/28/2024: Sodium 135, potassium 4.6, creatinine 1.59 ROS: .   Today she denies chest pain, shortness of breath, lower extremity edema, fatigue, palpitations, melena, hematuria, hemoptysis, diaphoresis, weakness, presyncope, syncope, orthopnea, and PND.  All other systems are reviewed and otherwise negative. Studies Reviewed: SABRA   EKG:  EKG is not ordered today.  CV Studies: Cardiac studies reviewed are outlined and summarized above. Otherwise please see EMR for full report. Cardiac Studies & Procedures   ______________________________________________________________________________________________ CARDIAC CATHETERIZATION  CARDIAC CATHETERIZATION 06/12/2024  Conclusion Images from the original  result were not included. Coronary angiography 06/12/2024: LM: Normal LAD: Patent prox-mid stent with only 20-30% late lumen loss Lcx: Large dominant vessel with mild OM2, mid OM disease Severe diffuse disease in small caliber distal Lcx/OM branches RCA: Small nondominant vessel with mild diffuse disease  LVEDP 26 mmHg    Conclusion: No severe proximal  epicardial disease Diffuse disease in distal Lcx/OM branches not amenable to PCI and unlikely to be the cause of low EF  Continue GMDT for HFrEF and polymorphic VT  Manish JINNY Lawrence, MD  Findings Coronary Findings Diagnostic  Dominance: Left  Left Anterior Descending Prox LAD to Mid LAD lesion is 30% stenosed. The lesion was previously treated .  Left Circumflex Dist Cx lesion is 40% stenosed.  Second Obtuse Marginal Branch 2nd Mrg lesion is 30% stenosed.  Left Posterior Atrioventricular Artery LPAV lesion is 70% stenosed with 80% stenosed side branch in LPDA.  Right Coronary Artery Vessel is small. There is mild diffuse disease throughout the vessel.  Intervention  No interventions have been documented.     ECHOCARDIOGRAM  ECHOCARDIOGRAM COMPLETE 06/11/2024  Narrative ECHOCARDIOGRAM REPORT    [name] Date of Exam: 06/11/2024 Medical Rec #:  [id number]    Height:       64.0 in Accession #:    [id number]   Weight:       145.5 lb Date of Birth:  24-Mar-1956     BSA:          1.709 m Patient Age:    68 years     BP:           94/56 mmHg Patient Gender: F            HR:           54 bpm. Exam Location:  [facility]  Procedure: 2D Echo, 3D Echo, Cardiac Doppler, Color Doppler and Intracardiac Opacification Agent (Both Spectral and Color Flow Doppler were utilized during procedure).  Indications:    Ventricualr Tachycardia l47.2  History:        Patient has prior history of Echocardiogram examinations, most recent 07/31/2019. Cardiomyopathy, CAD, Defibrillator; Risk Factors:Dyslipidemia. Hx of CKD.  Sonographer:    Aida Pizza RCS Referring Phys: [id number] LORETTE CINDERELLA KAPUR  IMPRESSIONS   1. Left ventricular ejection fraction, by estimation, is 35 to 40%. The left ventricle has moderately decreased function. The left ventricle demonstrates regional wall motion abnormalities (see scoring diagram/findings for description). Left  ventricular diastolic parameters were normal. 2. No formed LV mural thrombus evident by Definity  contrast. Transverse false tendon at apex. 3. Right ventricular systolic function is normal. The right ventricular size is normal. Tricuspid regurgitation signal is inadequate for assessing PA pressure. 4. The mitral valve is degenerative. Trivial mitral valve regurgitation. 5. The aortic valve was not well visualized. There is mild calcification of the aortic valve. Aortic valve regurgitation is not visualized. 6. The inferior vena cava is normal in size with greater than 50% respiratory variability, suggesting right atrial pressure of 3 mmHg.  Comparison(s): Prior images reviewed side by side. LVEF 35-40% with wall motion abnormalties consistent with ischemic cardiomyopathy.  FINDINGS Left Ventricle: Left ventricular ejection fraction, by estimation, is 35 to 40%. The left ventricle has moderately decreased function. The left ventricle demonstrates regional wall motion abnormalities. Definity  contrast agent was given IV to delineate the left ventricular endocardial borders. The left ventricular internal cavity size was normal in size. There is borderline concentric left ventricular hypertrophy.  Left ventricular diastolic parameters were normal.   LV Wall Scoring: The entire anterior septum, mid inferoseptal segment, apical anterior segment, apical inferior segment, and apex are akinetic. The inferior wall and basal inferoseptal segment are hypokinetic. The anterior wall and entire lateral wall are normal.  Right Ventricle: The right ventricular size is normal. No increase in right ventricular wall thickness. Right ventricular systolic function is normal. Tricuspid regurgitation signal is inadequate for assessing PA pressure.  Left Atrium: Left atrial size was normal in size.  Right Atrium: Right atrial size was normal in size.  Pericardium: There is no evidence of pericardial  effusion.  Mitral Valve: The mitral valve is degenerative in appearance. Trivial mitral valve regurgitation.  Tricuspid Valve: The tricuspid valve is grossly normal. Tricuspid valve regurgitation is trivial.  Aortic Valve: The aortic valve was not well visualized. There is mild calcification of the aortic valve. There is mild aortic valve annular calcification. Aortic valve regurgitation is not visualized.  Pulmonic Valve: The pulmonic valve was not well visualized. Pulmonic valve regurgitation is trivial.  Aorta: The aortic root is normal in size and structure.  Venous: The inferior vena cava is normal in size with greater than 50% respiratory variability, suggesting right atrial pressure of 3 mmHg.  IAS/Shunts: No atrial level shunt detected by color flow Doppler.  Additional Comments: 3D was performed not requiring image post processing on an independent workstation and was indeterminate. A device lead is visualized.   LEFT VENTRICLE PLAX 2D LVIDd:         4.30 cm   Diastology LVIDs:         3.20 cm   LV e' medial:    8.08 cm/s LV PW:         1.00 cm   LV E/e' medial:  10.9 LV IVS:        1.00 cm   LV e' lateral:   10.10 cm/s LVOT diam:     1.80 cm   LV E/e' lateral: 8.7 LV SV:         40 LV SV Index:   24 LVOT Area:     2.54 cm  3D Volume EF: LV EDV:       120 ml LV ESV:       43 ml LV SV:        77 ml  RIGHT VENTRICLE RV S prime:     10.20 cm/s TAPSE (M-mode): 2.7 cm  LEFT ATRIUM             Index        RIGHT ATRIUM           Index LA diam:        3.20 cm 1.87 cm/m   RA Area:     12.50 cm LA Vol (A2C):   39.8 ml 23.29 ml/m  RA Volume:   30.50 ml  17.85 ml/m LA Vol (A4C):   38.1 ml 22.29 ml/m LA Biplane Vol: 39.0 ml 22.82 ml/m AORTIC VALVE LVOT Vmax:   66.60 cm/s LVOT Vmean:  46.800 cm/s LVOT VTI:    0.158 m  AORTA Ao Root diam: 3.20 cm  MITRAL VALVE MV Area (PHT): 2.73 cm    SHUNTS MV Decel Time: 278 msec    Systemic VTI:  0.16 m MV E velocity:  88.20 cm/s  Systemic Diam: 1.80 cm MV A velocity: 71.70 cm/s MV E/A ratio:  1.23  Jayson Sierras MD Electronically signed by Jayson Sierras MD Signature Date/Time: 06/11/2024/1:23:56 PM  Final          ______________________________________________________________________________________________       Current Reported Medications:.    Current Meds  Medication Sig   acetaminophen  (TYLENOL ) 325 MG tablet Take 650 mg by mouth every 6 (six) hours as needed for mild pain or moderate pain.   amiodarone  (PACERONE ) 200 MG tablet Take 2 tablets (400 mg total) by mouth in the morning and at bedtime for 4 days, THEN 1 tablet (200 mg total) in the morning and at bedtime for 5 days, THEN 1 tablet (200 mg total) daily.   amiodarone  (PACERONE ) 200 MG tablet Take 1 tablet (200 mg total) by mouth 2 (two) times daily.   amiodarone  (PACERONE ) 400 MG tablet Take 1 tablet (400 mg total) by mouth 2 (two) times daily for 4 days.   aspirin  EC 81 MG tablet Take 1 tablet (81 mg total) by mouth daily.   DULoxetine  (CYMBALTA ) 60 MG capsule Take 60 mg by mouth daily.    levothyroxine (SYNTHROID) 25 MCG tablet Take 25 mcg by mouth daily.   metoprolol  succinate (TOPROL -XL) 100 MG 24 hr tablet Take 1 tablet (100 mg total) by mouth every evening. Take with or immediately following a meal.   mexiletine (MEXITIL ) 150 MG capsule Take 1 capsule (150 mg total) by mouth every 12 (twelve) hours.   nitroGLYCERIN  (NITROSTAT ) 0.4 MG SL tablet Place 1 tablet (0.4 mg total) under the tongue every 5 (five) minutes x 3 doses as needed for chest pain.   pantoprazole  (PROTONIX ) 40 MG tablet Take 1 tablet (40 mg total) by mouth daily.   rosuvastatin  (CRESTOR ) 40 MG tablet Take 1 tablet (40 mg total) by mouth daily.   [DISCONTINUED] sodium zirconium cyclosilicate (LOKELMA) 10 g PACK packet Take 10 g by mouth as needed (daily for elevated potassium to take if directed).   [DISCONTINUED] sodium zirconium cyclosilicate (LOKELMA)  10 g PACK packet Take 10 g by mouth daily.    Physical Exam:    VS:  BP 138/78   Pulse (!) 57   Ht 5' 4 (1.626 m)   Wt 150 lb 9.6 oz (68.3 kg)   SpO2 98%   BMI 25.85 kg/m    Wt Readings from Last 3 Encounters:  07/19/24 150 lb 9.6 oz (68.3 kg)  07/02/24 148 lb 3.2 oz (67.2 kg)  06/27/24 149 lb 12.8 oz (67.9 kg)    GEN: Well nourished, well developed in no acute distress NECK: No JVD; No carotid bruits CARDIAC: RRR, no murmurs, rubs, gallops RESPIRATORY:  Clear to auscultation without rales, wheezing or rhonchi  ABDOMEN: Soft, non-tender, non-distended EXTREMITIES:  No edema; No acute deformity     Asessement and Plan:.    CAD: Anterior MI in 2010 treated with PCI (BMS) to LAD.  LHC in 05/2024 with patent LAD stent, diffuse disease in distal LCx/OM branches that were not amenable to PCI not felt to be contributing to reduced LVEF. Stable with no anginal symptoms. No indication for ischemic evaluation.  Heart healthy diet and regular cardiovascular exercise encouraged.  Reviewed ED precautions.  Continue amiodarone  200 mg daily.  Aspirin  81 mg daily, metoprolol  succinate 100 mg daily, Crestor  40 mg daily.  Hyperkalemia: Patient previously noted to be hyperkalemic requiring Lokelma. Check BMET today.   HFrEF: Patient with ischemic cardiomyopathy previously with LVEF 35 to 40%, later recovered.  TTE in 05/2024 showed LVEF back down to 35 to 40% with significant septal wall hypokinesis/akinesis.  Today she denies any increased shortness of breath,  lower extremity edema, orthopnea or PND.  She appears euvolemic and well compensated on exam.  Given recent hyperkalemia and AKI check BMP today.  If renal function and potassium levels are stable we will plan to start valsartan 80 mg daily.  Patient in agreement with plan.  Polymorphic VT: s/p ICD, patient admitted in 05/2024 after multiple rounds of ATP and at least 5 shocks for PM VT/V-fib and patient with history of ischemic cardiomyopathy at  time of shocks with medication noncompliance.  Patient was started on amiodarone  as well as mexiletine.  Today she reports she is doing well, denies any palpitations or feeling of irregular heartbeats.  Monitored by EP.  HLD: Last blood profile in 06/24/2024 indicated total cholesterol 134, triglycerides 194, HDL 39 and LDL 63.  Continue Crestor  40 mg daily.   Disposition: F/u with Kaiesha Tonner, NP in six weeks.   Signed, Moncerrath Berhe D Marranda Arakelian, NP

## 2024-07-19 ENCOUNTER — Ambulatory Visit: Attending: Cardiology | Admitting: Cardiology

## 2024-07-19 ENCOUNTER — Encounter: Payer: Self-pay | Admitting: Cardiology

## 2024-07-19 VITALS — BP 138/78 | HR 57 | Ht 64.0 in | Wt 150.6 lb

## 2024-07-19 DIAGNOSIS — I472 Ventricular tachycardia, unspecified: Secondary | ICD-10-CM

## 2024-07-19 DIAGNOSIS — I10 Essential (primary) hypertension: Secondary | ICD-10-CM | POA: Diagnosis not present

## 2024-07-19 DIAGNOSIS — I5022 Chronic systolic (congestive) heart failure: Secondary | ICD-10-CM | POA: Diagnosis not present

## 2024-07-19 DIAGNOSIS — E875 Hyperkalemia: Secondary | ICD-10-CM

## 2024-07-19 DIAGNOSIS — Z79899 Other long term (current) drug therapy: Secondary | ICD-10-CM

## 2024-07-19 DIAGNOSIS — I2510 Atherosclerotic heart disease of native coronary artery without angina pectoris: Secondary | ICD-10-CM | POA: Diagnosis not present

## 2024-07-19 DIAGNOSIS — Z95810 Presence of automatic (implantable) cardiac defibrillator: Secondary | ICD-10-CM

## 2024-07-19 NOTE — Patient Instructions (Signed)
 Medication Instructions:  Your physician recommends that you continue on your current medications as directed. Please refer to the Current Medication list given to you today.   Labwork: BMET to be completed today at our [facility] Lab  Testing/Procedures: None  Follow-Up: Your physician recommends that you schedule a follow-up appointment in: 4-6 weeks  Any Other Special Instructions Will Be Listed Below (If Applicable). Thank you for choosing [facility]!     If you need a refill on your cardiac medications before your next appointment, please call your pharmacy.

## 2024-07-20 LAB — BASIC METABOLIC PANEL WITH GFR
BUN/Creatinine Ratio: 11 — ABNORMAL LOW (ref 12–28)
BUN: 21 mg/dL (ref 8–27)
CO2: 18 mmol/L — ABNORMAL LOW (ref 20–29)
Calcium: 10.2 mg/dL (ref 8.7–10.3)
Chloride: 104 mmol/L (ref 96–106)
Creatinine, Ser: 1.91 mg/dL — ABNORMAL HIGH (ref 0.57–1.00)
Glucose: 94 mg/dL (ref 70–99)
Potassium: 5.1 mmol/L (ref 3.5–5.2)
Sodium: 141 mmol/L (ref 134–144)
eGFR: 29 mL/min/1.73 — ABNORMAL LOW (ref >59)

## 2024-07-22 ENCOUNTER — Ambulatory Visit: Payer: Self-pay | Admitting: Cardiology

## 2024-07-22 DIAGNOSIS — N1832 Chronic kidney disease, stage 3b: Secondary | ICD-10-CM

## 2024-07-24 MED ORDER — AMLODIPINE BESYLATE 5 MG PO TABS: 5.0000 mg | ORAL_TABLET | Freq: Every day | ORAL | 3 refills | Status: AC

## 2024-07-24 NOTE — Telephone Encounter (Signed)
 Spoke with pt regarding lab results. Pt verbalized understanding. Pt agreed to start Amlodipine 5 mg one tablet at bedtime. Medication sent to [facility] on [address] in [place] per pts request. Referral sent to Dr Tobie nephrology. Pt was advised to call our office if she has any further questions.

## 2024-08-03 LAB — BASIC METABOLIC PANEL WITH GFR
BUN/Creatinine Ratio: 9 — ABNORMAL LOW (ref 12–28)
BUN: 19 mg/dL (ref 8–27)
CO2: 20 mmol/L (ref 20–29)
Calcium: 10 mg/dL (ref 8.7–10.3)
Chloride: 107 mmol/L — ABNORMAL HIGH (ref 96–106)
Creatinine, Ser: 2.01 mg/dL — ABNORMAL HIGH (ref 0.57–1.00)
Glucose: 105 mg/dL — ABNORMAL HIGH (ref 70–99)
Potassium: 5 mmol/L (ref 3.5–5.2)
Sodium: 143 mmol/L (ref 134–144)
eGFR: 27 mL/min/1.73 — ABNORMAL LOW (ref >59)

## 2024-08-21 ENCOUNTER — Ambulatory Visit: Payer: Medicare PPO

## 2024-08-21 DIAGNOSIS — I5022 Chronic systolic (congestive) heart failure: Secondary | ICD-10-CM

## 2024-08-22 ENCOUNTER — Ambulatory Visit: Payer: Self-pay | Admitting: Cardiology

## 2024-08-22 LAB — CUP PACEART REMOTE DEVICE CHECK
Battery Remaining Longevity: 17 mo
Battery Voltage: 2.97 V
Brady Statistic RV Percent Paced: 0.01 %
Date Time Interrogation Session: 20251203022603
HighPow Impedance: 82 Ohm
Implantable Lead Connection Status: 753985
Implantable Lead Implant Date: 20110110
Implantable Lead Location: 753860
Implantable Lead Model: 6935
Implantable Pulse Generator Implant Date: 20190301
Lead Channel Impedance Value: 437 Ohm
Lead Channel Impedance Value: 513 Ohm
Lead Channel Pacing Threshold Amplitude: 0.875 V
Lead Channel Pacing Threshold Pulse Width: 0.4 ms
Lead Channel Sensing Intrinsic Amplitude: 21.375 mV
Lead Channel Sensing Intrinsic Amplitude: 21.375 mV
Lead Channel Setting Pacing Amplitude: 2.5 V
Lead Channel Setting Pacing Pulse Width: 0.4 ms
Lead Channel Setting Sensing Sensitivity: 0.3 mV
Zone Setting Status: 755011

## 2024-08-22 NOTE — Progress Notes (Signed)
 Cardiology Office Note    Date:  08/23/2024  ID:  ELVENIA GODDEN, DOB Feb 09, 1956, MRN [id number] PCP:  Freddrick, No  Cardiologist:  Ozell Fell, MD  Electrophysiologist:  Soyla Gladis Norton, MD   Chief Complaint: Follow up for HFrEF   History of Present Illness: .    ERETRIA MANTERNACH is a 68 y.o. female with visit-pertinent history of CAD s/p anterior MI in 2010 treated with PCI (BMS to LAD), ischemic cardiomyopathy/HFrEF, VT/SVT s/p ICD with [employer] implanted for primary prevention in 06/2010 with generator change in 11/2017, hyperlipidemia, CKD, GERD, history of GI bleed in 04/2020.   Patient was last seen in office in 12/2022 with Dr. Fernande for follow-up.  At that time patient was doing well from a cardiac standpoint.   On 06/10/2024 patient had alert from ICD for an event lasting 13 seconds with polymorphic VT and VF zone, heart rate 400, terminated with 25 J of HV therapy.  Also noted 9 additional SVT events, 2 terminated with 1 burst of ATP, longest duration 1 minute and 16 seconds with heart rate from 231-353.  Also noted 3 and 46 logged in SVT with 793 logged high rate in SVT.  Patient was contacted and reported she was asleep during time of shock but did note she did not feel yourself that morning.  ED visit was encouraged.  On interview in the ED patient reported she had not felt any shocks at home but did feel shocks that occurred in the ED.  She reported she been out of all of her meds for at least 3 to 4 weeks to running out and not making follow-up appointments to get refills.   Following admission device report showed at least 5 additional ICD shocks on 9/22, patient with start IV amiodarone .  Patient was transferred to [facility] from [facility] for further evaluation.  Patient evaluated by EP, continued on amiodarone  and also started on mexiletine.  Patient's troponins were negative x 2 even with ICD shocks.  LHC with patent LAD stent, diffuse disease in distal LCx/OM branches that were not  amenable to PCI and not felt to be contributing to reduced LVEF.   Patient seen in clinic on 06/27/2024 for hospital follow-up, had been seen by her PCP earlier in the week and noted to have a significantly elevated potassium level however did not present to the emergency room.  Stat BMP checked, potassium was 5.7, she was given 2 packets of Lokelma  with improvement.   Patient was last in the clinic on 07/19/2024 for follow-up.  She reported she been doing very well overall.  She denied any chest pain, shortness of breath, other extremity edema, orthopnea or PND.  She reported that her systolic blood pressure typically stayed at 140.  Lab work is ordered to follow-up on her renal function given decline, her renal function remained reduced, she was referred to nephrology and started on amlodipine  5 mg nightly.  Today she presents for follow-up.  She reports that she has been doing well overall. She denies chest pain, shortness of breath, lower extremity edema, orthopnea or pnd. She denies palpitations, presyncope or syncope.  Patient reports that she tries to stay well-hydrated with water .  She reports that her weights have been stable at home.  She denies any cardiac concerns or complaints today, she is regularly walking and tolerating well.  She reports that she has a follow-up with nephrology in January.  ROS: .   Today she denies chest pain,  shortness of breath, lower extremity edema, fatigue, palpitations, melena, hematuria, hemoptysis, diaphoresis, weakness, presyncope, syncope, orthopnea, and PND.  All other systems are reviewed and otherwise negative. Studies Reviewed: SABRA   EKG:  EKG is not ordered today.  CV Studies: Cardiac studies reviewed are outlined and summarized above. Otherwise please see EMR for full report. Cardiac Studies & Procedures   ______________________________________________________________________________________________ CARDIAC CATHETERIZATION  CARDIAC CATHETERIZATION  06/12/2024  Conclusion Images from the original result were not included. Coronary angiography 06/12/2024: LM: Normal LAD: Patent prox-mid stent with only 20-30% late lumen loss Lcx: Large dominant vessel with mild OM2, mid OM disease Severe diffuse disease in small caliber distal Lcx/OM branches RCA: Small nondominant vessel with mild diffuse disease  LVEDP 26 mmHg    Conclusion: No severe proximal epicardial disease Diffuse disease in distal Lcx/OM branches not amenable to PCI and unlikely to be the cause of low EF  Continue GMDT for HFrEF and polymorphic VT  Manish JINNY Lawrence, MD  Findings Coronary Findings Diagnostic  Dominance: Left  Left Anterior Descending Prox LAD to Mid LAD lesion is 30% stenosed. The lesion was previously treated .  Left Circumflex Dist Cx lesion is 40% stenosed.  Second Obtuse Marginal Branch 2nd Mrg lesion is 30% stenosed.  Left Posterior Atrioventricular Artery LPAV lesion is 70% stenosed with 80% stenosed side branch in LPDA.  Right Coronary Artery Vessel is small. There is mild diffuse disease throughout the vessel.  Intervention  No interventions have been documented.     ECHOCARDIOGRAM  ECHOCARDIOGRAM COMPLETE 06/11/2024  Narrative ECHOCARDIOGRAM REPORT    Patient Name:   MARKEYA MINCY Date of Exam: 06/11/2024 Medical Rec #:  [id number]    Height:       64.0 in Accession #:    [id number]   Weight:       145.5 lb Date of Birth:  Jan 01, 1956     BSA:          1.709 m Patient Age:    68 years     BP:           94/56 mmHg Patient Gender: F            HR:           54 bpm. Exam Location:  [facility]  Procedure: 2D Echo, 3D Echo, Cardiac Doppler, Color Doppler and Intracardiac Opacification Agent (Both Spectral and Color Flow Doppler were utilized during procedure).  Indications:    Ventricualr Tachycardia l47.2  History:        Patient has prior history of Echocardiogram examinations, most recent 07/31/2019.  Cardiomyopathy, CAD, Defibrillator; Risk Factors:Dyslipidemia. Hx of CKD.  Sonographer:    Aida Pizza RCS Referring Phys: [id number] LORETTE CINDERELLA KAPUR  IMPRESSIONS   1. Left ventricular ejection fraction, by estimation, is 35 to 40%. The left ventricle has moderately decreased function. The left ventricle demonstrates regional wall motion abnormalities (see scoring diagram/findings for description). Left ventricular diastolic parameters were normal. 2. No formed LV mural thrombus evident by Definity  contrast. Transverse false tendon at apex. 3. Right ventricular systolic function is normal. The right ventricular size is normal. Tricuspid regurgitation signal is inadequate for assessing PA pressure. 4. The mitral valve is degenerative. Trivial mitral valve regurgitation. 5. The aortic valve was not well visualized. There is mild calcification of the aortic valve. Aortic valve regurgitation is not visualized. 6. The inferior vena cava is normal in size with greater than 50% respiratory variability, suggesting right atrial pressure of 3 mmHg.  Comparison(s): Prior images reviewed side by side. LVEF 35-40% with wall motion abnormalties consistent with ischemic cardiomyopathy.  FINDINGS Left Ventricle: Left ventricular ejection fraction, by estimation, is 35 to 40%. The left ventricle has moderately decreased function. The left ventricle demonstrates regional wall motion abnormalities. Definity  contrast agent was given IV to delineate the left ventricular endocardial borders. The left ventricular internal cavity size was normal in size. There is borderline concentric left ventricular hypertrophy. Left ventricular diastolic parameters were normal.   LV Wall Scoring: The entire anterior septum, mid inferoseptal segment, apical anterior segment, apical inferior segment, and apex are akinetic. The inferior wall and basal inferoseptal segment are hypokinetic. The anterior wall and entire lateral  wall are normal.  Right Ventricle: The right ventricular size is normal. No increase in right ventricular wall thickness. Right ventricular systolic function is normal. Tricuspid regurgitation signal is inadequate for assessing PA pressure.  Left Atrium: Left atrial size was normal in size.  Right Atrium: Right atrial size was normal in size.  Pericardium: There is no evidence of pericardial effusion.  Mitral Valve: The mitral valve is degenerative in appearance. Trivial mitral valve regurgitation.  Tricuspid Valve: The tricuspid valve is grossly normal. Tricuspid valve regurgitation is trivial.  Aortic Valve: The aortic valve was not well visualized. There is mild calcification of the aortic valve. There is mild aortic valve annular calcification. Aortic valve regurgitation is not visualized.  Pulmonic Valve: The pulmonic valve was not well visualized. Pulmonic valve regurgitation is trivial.  Aorta: The aortic root is normal in size and structure.  Venous: The inferior vena cava is normal in size with greater than 50% respiratory variability, suggesting right atrial pressure of 3 mmHg.  IAS/Shunts: No atrial level shunt detected by color flow Doppler.  Additional Comments: 3D was performed not requiring image post processing on an independent workstation and was indeterminate. A device lead is visualized.   LEFT VENTRICLE PLAX 2D LVIDd:         4.30 cm   Diastology LVIDs:         3.20 cm   LV e' medial:    8.08 cm/s LV PW:         1.00 cm   LV E/e' medial:  10.9 LV IVS:        1.00 cm   LV e' lateral:   10.10 cm/s LVOT diam:     1.80 cm   LV E/e' lateral: 8.7 LV SV:         40 LV SV Index:   24 LVOT Area:     2.54 cm  3D Volume EF: LV EDV:       120 ml LV ESV:       43 ml LV SV:        77 ml  RIGHT VENTRICLE RV S prime:     10.20 cm/s TAPSE (M-mode): 2.7 cm  LEFT ATRIUM             Index        RIGHT ATRIUM           Index LA diam:        3.20 cm 1.87 cm/m   RA  Area:     12.50 cm LA Vol (A2C):   39.8 ml 23.29 ml/m  RA Volume:   30.50 ml  17.85 ml/m LA Vol (A4C):   38.1 ml 22.29 ml/m LA Biplane Vol: 39.0 ml 22.82 ml/m AORTIC VALVE LVOT Vmax:   66.60 cm/s LVOT Vmean:  46.800 cm/s LVOT VTI:    0.158 m  AORTA Ao Root diam: 3.20 cm  MITRAL VALVE MV Area (PHT): 2.73 cm    SHUNTS MV Decel Time: 278 msec    Systemic VTI:  0.16 m MV E velocity: 88.20 cm/s  Systemic Diam: 1.80 cm MV A velocity: 71.70 cm/s MV E/A ratio:  1.23  Jayson Sierras MD Electronically signed by Jayson Sierras MD Signature Date/Time: 06/11/2024/1:23:56 PM    Final          ______________________________________________________________________________________________       Current Reported Medications:.    Current Meds  Medication Sig   acetaminophen  (TYLENOL ) 325 MG tablet Take 650 mg by mouth every 6 (six) hours as needed for mild pain or moderate pain.   amiodarone  (PACERONE ) 200 MG tablet Take 2 tablets (400 mg total) by mouth in the morning and at bedtime for 4 days, THEN 1 tablet (200 mg total) in the morning and at bedtime for 5 days, THEN 1 tablet (200 mg total) daily.   amiodarone  (PACERONE ) 200 MG tablet Take 1 tablet (200 mg total) by mouth 2 (two) times daily.   amiodarone  (PACERONE ) 400 MG tablet Take 1 tablet (400 mg total) by mouth 2 (two) times daily for 4 days.   amLODipine  (NORVASC ) 5 MG tablet Take 1 tablet (5 mg total) by mouth at bedtime.   aspirin  EC 81 MG tablet Take 1 tablet (81 mg total) by mouth daily.   DULoxetine  (CYMBALTA ) 60 MG capsule Take 60 mg by mouth daily.    levothyroxine (SYNTHROID) 25 MCG tablet Take 25 mcg by mouth daily.   metoprolol  succinate (TOPROL -XL) 100 MG 24 hr tablet Take 1 tablet (100 mg total) by mouth every evening. Take with or immediately following a meal.   mexiletine (MEXITIL ) 150 MG capsule Take 1 capsule (150 mg total) by mouth every 12 (twelve) hours.   nitroGLYCERIN  (NITROSTAT ) 0.4 MG SL tablet  Place 1 tablet (0.4 mg total) under the tongue every 5 (five) minutes x 3 doses as needed for chest pain.   pantoprazole  (PROTONIX ) 40 MG tablet Take 1 tablet (40 mg total) by mouth daily.   rosuvastatin  (CRESTOR ) 40 MG tablet Take 1 tablet (40 mg total) by mouth daily.   Physical Exam:    VS:  BP 128/60 (BP Location: Left Arm, Patient Position: Sitting, Cuff Size: Normal)   Pulse (!) 57   Ht 5' 4 (1.626 m)   Wt 148 lb (67.1 kg)   BMI 25.40 kg/m    Wt Readings from Last 3 Encounters:  08/23/24 148 lb (67.1 kg)  07/19/24 150 lb 9.6 oz (68.3 kg)  07/02/24 148 lb 3.2 oz (67.2 kg)    GEN: Well nourished, well developed in no acute distress NECK: No JVD; No carotid bruits CARDIAC: RRR, no murmurs, rubs, gallops RESPIRATORY:  Clear to auscultation without rales, wheezing or rhonchi  ABDOMEN: Soft, non-tender, non-distended EXTREMITIES:  No edema; No acute deformity     Asessement and Plan:.    CAD: Anterior MI in 2010 treated with PCI (BMS) to LAD. LHC in 05/2024 with patent LAD stent, diffuse disease in distal LCx/OM branches that were not amenable to PCI not felt to be contributing to reduced LVEF.  Stable with no anginal symptoms. No indication for ischemic evaluation.  Heart healthy diet and regular cardiovascular exercise encouraged.  Continue aspirin  81 mg daily, amlodipine  5 mg daily, metoprolol  succinate 100 mg daily, Crestor  40 mg daily.Reviewed ED precautions  HFrEF: Patient with  ischemic cardiomyopathy previously with LVEF 35 to 40%, later recovered. TTE in 05/2024 showed LVEF back down to 35 to 40% with significant septal wall hypokinesis/akinesis.  Today she reports that she is doing well, denies any shortness of breath, lower extremity edema, orthopnea or PND.  Patient's weights are stable. Patient appears euvolemic and well compensated on exam. GDMT has been limited by renal function.  She is to follow-up with nephrology next month.  Check BMET today.  Reviewed ED precautions.   Continue metoprolol  succinate 100 mg daily.  Polymorphic VT:s/p ICD, patient admitted in 05/2024 after multiple rounds of ATP and at least 5 shocks for PM VT/V-fib and patient with history of ischemic cardiomyopathy at time of shocks with medication noncompliance. Patient was started on amiodarone  as well as mexiletine.  She denies any palpitations or feeling of irregular heartbeats.  Device interrogation on 12/4 with no arrhythmias noted.  HLD: Last blood profile in 06/24/2024 indicated total cholesterol 134, triglycerides 194, HDL 39 and LDL 63. Continue Crestor  40 mg daily.  Pending repeat be met may need to consider transitioning to atorvastatin.  CKD stage 3b: Last creatinine 2.01.  Patient has been referred to nephrology. Check BMET today.    Disposition: F/u with Dr. Wonda or Terrence Wishon, NP in 8 weeks or sooner if needed.   Signed, Jilliana Burkes D Karon Cotterill, NP

## 2024-08-23 ENCOUNTER — Ambulatory Visit: Attending: Cardiology | Admitting: Cardiology

## 2024-08-23 ENCOUNTER — Encounter: Payer: Self-pay | Admitting: Cardiology

## 2024-08-23 VITALS — BP 128/60 | HR 57 | Ht 64.0 in | Wt 148.0 lb

## 2024-08-23 DIAGNOSIS — I2510 Atherosclerotic heart disease of native coronary artery without angina pectoris: Secondary | ICD-10-CM | POA: Diagnosis not present

## 2024-08-23 DIAGNOSIS — I10 Essential (primary) hypertension: Secondary | ICD-10-CM

## 2024-08-23 DIAGNOSIS — I5022 Chronic systolic (congestive) heart failure: Secondary | ICD-10-CM | POA: Diagnosis not present

## 2024-08-23 DIAGNOSIS — I472 Ventricular tachycardia, unspecified: Secondary | ICD-10-CM

## 2024-08-23 DIAGNOSIS — N1832 Chronic kidney disease, stage 3b: Secondary | ICD-10-CM | POA: Diagnosis not present

## 2024-08-23 LAB — BASIC METABOLIC PANEL WITH GFR
BUN/Creatinine Ratio: 12 (ref 12–28)
BUN: 26 mg/dL (ref 8–27)
CO2: 21 mmol/L (ref 20–29)
Calcium: 10.2 mg/dL (ref 8.7–10.3)
Chloride: 105 mmol/L (ref 96–106)
Creatinine, Ser: 2.18 mg/dL — ABNORMAL HIGH (ref 0.57–1.00)
Glucose: 88 mg/dL (ref 70–99)
Potassium: 4.5 mmol/L (ref 3.5–5.2)
Sodium: 142 mmol/L (ref 134–144)
eGFR: 24 mL/min/1.73 — ABNORMAL LOW (ref >59)

## 2024-08-23 NOTE — Patient Instructions (Addendum)
 Medication Instructions:  NO CHANGES  Lab Work: BMET TO BE DONE TODAY.  Testing/Procedures: NONE  Follow-Up: At [facility], you and your health needs are our priority.  As part of our continuing mission to provide you with exceptional heart care, our providers are all part of one team.  This team includes your primary Cardiologist (physician) and Advanced Practice Providers or APPs (Physician Assistants and Nurse Practitioners) who all work together to provide you with the care you need, when you need it.  Your next appointment:   8 WEEKS  Provider:   Ozell Fell, MD

## 2024-08-26 ENCOUNTER — Ambulatory Visit: Payer: Self-pay | Admitting: Cardiology

## 2024-08-26 ENCOUNTER — Telehealth: Payer: Self-pay | Admitting: Cardiology

## 2024-08-26 ENCOUNTER — Other Ambulatory Visit: Payer: Self-pay

## 2024-08-26 DIAGNOSIS — I2510 Atherosclerotic heart disease of native coronary artery without angina pectoris: Secondary | ICD-10-CM

## 2024-08-26 DIAGNOSIS — Z79899 Other long term (current) drug therapy: Secondary | ICD-10-CM

## 2024-08-26 MED ORDER — ATORVASTATIN CALCIUM 80 MG PO TABS: 80.0000 mg | ORAL_TABLET | Freq: Every day | ORAL | 3 refills | Status: AC

## 2024-08-26 NOTE — Telephone Encounter (Signed)
 S/W pt and reviewed labs results.  Patient stats that she has appointment with nephrology in January 2026.  Ordered the lab work (fasting lipids, LFTs) patient understands to complete the labs in 8 weeks.  Patient understands to STOP Rosuvastatin  and to START Atorvastatin  80 mg daily. Per Katlyn West, NP  Rx for Atorvastatin  sent to patient pharmacy of choice.

## 2024-08-26 NOTE — Telephone Encounter (Signed)
 Pt c/o medication issue:  1. Name of Medication: atorvastatin  (LIPITOR) 80 MG tablet   2. How are you currently taking this medication (dosage and times per day)? N/A  3. Are you having a reaction (difficulty breathing--STAT)? No   4. What is your medication issue? [employer] calling to request a prior auth for this medication

## 2024-08-27 NOTE — Progress Notes (Signed)
 Remote ICD Transmission

## 2024-09-25 NOTE — Telephone Encounter (Signed)
 Please call patient and see when she is planning to follow up with nephrology. If not within the next week would recommend checking a BMET and CK now that we have transitioned her from Crestor  to Lipitor. Thanks!

## 2024-10-01 LAB — BASIC METABOLIC PANEL WITH GFR
BUN/Creatinine Ratio: 10 — ABNORMAL LOW (ref 12–28)
BUN: 18 mg/dL (ref 8–27)
CO2: 22 mmol/L (ref 20–29)
Calcium: 9.1 mg/dL (ref 8.7–10.3)
Chloride: 105 mmol/L (ref 96–106)
Creatinine, Ser: 1.72 mg/dL — ABNORMAL HIGH (ref 0.57–1.00)
Glucose: 103 mg/dL — ABNORMAL HIGH (ref 70–99)
Potassium: 4.7 mmol/L (ref 3.5–5.2)
Sodium: 143 mmol/L (ref 134–144)
eGFR: 32 mL/min/1.73 — ABNORMAL LOW

## 2024-10-01 LAB — CK: Total CK: 52 U/L (ref 32–182)

## 2024-10-05 ENCOUNTER — Encounter (HOSPITAL_COMMUNITY): Payer: Self-pay

## 2024-10-05 ENCOUNTER — Other Ambulatory Visit: Payer: Self-pay

## 2024-10-05 ENCOUNTER — Emergency Department (HOSPITAL_COMMUNITY)

## 2024-10-05 ENCOUNTER — Inpatient Hospital Stay (HOSPITAL_COMMUNITY)
Admission: EM | Admit: 2024-10-05 | Discharge: 2024-10-11 | DRG: 277 | Disposition: A | Discharge status: Home / Self Care | Attending: Cardiovascular Disease | Admitting: Cardiovascular Disease

## 2024-10-05 DIAGNOSIS — Z8542 Personal history of malignant neoplasm of other parts of uterus: Secondary | ICD-10-CM

## 2024-10-05 DIAGNOSIS — I493 Ventricular premature depolarization: Secondary | ICD-10-CM | POA: Diagnosis present

## 2024-10-05 DIAGNOSIS — Z7982 Long term (current) use of aspirin: Secondary | ICD-10-CM

## 2024-10-05 DIAGNOSIS — I3139 Other pericardial effusion (noninflammatory): Secondary | ICD-10-CM | POA: Diagnosis present

## 2024-10-05 DIAGNOSIS — Z955 Presence of coronary angioplasty implant and graft: Secondary | ICD-10-CM | POA: Diagnosis not present

## 2024-10-05 DIAGNOSIS — E785 Hyperlipidemia, unspecified: Secondary | ICD-10-CM | POA: Diagnosis present

## 2024-10-05 DIAGNOSIS — Z95810 Presence of automatic (implantable) cardiac defibrillator: Secondary | ICD-10-CM | POA: Diagnosis not present

## 2024-10-05 DIAGNOSIS — I5022 Chronic systolic (congestive) heart failure: Secondary | ICD-10-CM | POA: Diagnosis present

## 2024-10-05 DIAGNOSIS — T82198A Other mechanical complication of other cardiac electronic device, initial encounter: Secondary | ICD-10-CM | POA: Diagnosis present

## 2024-10-05 DIAGNOSIS — T82110A Breakdown (mechanical) of cardiac electrode, initial encounter: Secondary | ICD-10-CM | POA: Diagnosis present

## 2024-10-05 DIAGNOSIS — I472 Ventricular tachycardia, unspecified: Secondary | ICD-10-CM | POA: Diagnosis present

## 2024-10-05 DIAGNOSIS — I252 Old myocardial infarction: Secondary | ICD-10-CM

## 2024-10-05 DIAGNOSIS — Z79890 Hormone replacement therapy (postmenopausal): Secondary | ICD-10-CM

## 2024-10-05 DIAGNOSIS — T82118A Breakdown (mechanical) of other cardiac electronic device, initial encounter: Secondary | ICD-10-CM | POA: Diagnosis present

## 2024-10-05 DIAGNOSIS — I2510 Atherosclerotic heart disease of native coronary artery without angina pectoris: Secondary | ICD-10-CM | POA: Diagnosis present

## 2024-10-05 DIAGNOSIS — Z90711 Acquired absence of uterus with remaining cervical stump: Secondary | ICD-10-CM

## 2024-10-05 DIAGNOSIS — I130 Hypertensive heart and chronic kidney disease with heart failure and stage 1 through stage 4 chronic kidney disease, or unspecified chronic kidney disease: Secondary | ICD-10-CM | POA: Diagnosis present

## 2024-10-05 DIAGNOSIS — Z79899 Other long term (current) drug therapy: Secondary | ICD-10-CM | POA: Diagnosis not present

## 2024-10-05 DIAGNOSIS — F32A Depression, unspecified: Secondary | ICD-10-CM | POA: Diagnosis present

## 2024-10-05 DIAGNOSIS — Z87891 Personal history of nicotine dependence: Secondary | ICD-10-CM | POA: Diagnosis not present

## 2024-10-05 DIAGNOSIS — K219 Gastro-esophageal reflux disease without esophagitis: Secondary | ICD-10-CM | POA: Diagnosis present

## 2024-10-05 DIAGNOSIS — Z8249 Family history of ischemic heart disease and other diseases of the circulatory system: Secondary | ICD-10-CM

## 2024-10-05 DIAGNOSIS — N1832 Chronic kidney disease, stage 3b: Secondary | ICD-10-CM | POA: Diagnosis present

## 2024-10-05 DIAGNOSIS — I471 Supraventricular tachycardia, unspecified: Secondary | ICD-10-CM | POA: Diagnosis present

## 2024-10-05 DIAGNOSIS — I255 Ischemic cardiomyopathy: Secondary | ICD-10-CM | POA: Diagnosis present

## 2024-10-05 DIAGNOSIS — E039 Hypothyroidism, unspecified: Secondary | ICD-10-CM | POA: Diagnosis present

## 2024-10-05 DIAGNOSIS — Z7401 Bed confinement status: Secondary | ICD-10-CM | POA: Diagnosis not present

## 2024-10-05 DIAGNOSIS — E876 Hypokalemia: Secondary | ICD-10-CM | POA: Diagnosis present

## 2024-10-05 DIAGNOSIS — Y712 Prosthetic and other implants, materials and accessory cardiovascular devices associated with adverse incidents: Secondary | ICD-10-CM | POA: Diagnosis present

## 2024-10-05 DIAGNOSIS — T82118D Breakdown (mechanical) of other cardiac electronic device, subsequent encounter: Secondary | ICD-10-CM | POA: Diagnosis not present

## 2024-10-05 DIAGNOSIS — R531 Weakness: Secondary | ICD-10-CM | POA: Diagnosis not present

## 2024-10-05 LAB — CBC WITH DIFFERENTIAL/PLATELET
Abs Immature Granulocytes: 0.03 K/uL (ref 0.00–0.07)
Basophils Absolute: 0.1 K/uL (ref 0.0–0.1)
Basophils Relative: 1 %
Eosinophils Absolute: 0.1 K/uL (ref 0.0–0.5)
Eosinophils Relative: 1 %
HCT: 40 % (ref 36.0–46.0)
Hemoglobin: 12.3 g/dL (ref 12.0–15.0)
Immature Granulocytes: 0 %
Lymphocytes Relative: 20 %
Lymphs Abs: 1.8 K/uL (ref 0.7–4.0)
MCH: 27.8 pg (ref 26.0–34.0)
MCHC: 30.8 g/dL (ref 30.0–36.0)
MCV: 90.5 fL (ref 80.0–100.0)
Monocytes Absolute: 0.7 K/uL (ref 0.1–1.0)
Monocytes Relative: 8 %
Neutro Abs: 6.2 K/uL (ref 1.7–7.7)
Neutrophils Relative %: 70 %
Platelets: 213 K/uL (ref 150–400)
RBC: 4.42 MIL/uL (ref 3.87–5.11)
RDW: 13.5 % (ref 11.5–15.5)
WBC: 8.8 K/uL (ref 4.0–10.5)
nRBC: 0 % (ref 0.0–0.2)

## 2024-10-05 LAB — BASIC METABOLIC PANEL WITH GFR
Anion gap: 14 (ref 5–15)
BUN: 19 mg/dL (ref 8–23)
CO2: 21 mmol/L — ABNORMAL LOW (ref 22–32)
Calcium: 8.8 mg/dL — ABNORMAL LOW (ref 8.9–10.3)
Chloride: 104 mmol/L (ref 98–111)
Creatinine, Ser: 1.81 mg/dL — ABNORMAL HIGH (ref 0.44–1.00)
GFR, Estimated: 30 mL/min — ABNORMAL LOW
Glucose, Bld: 100 mg/dL — ABNORMAL HIGH (ref 70–99)
Potassium: 3.8 mmol/L (ref 3.5–5.1)
Sodium: 139 mmol/L (ref 135–145)

## 2024-10-05 LAB — PRO BRAIN NATRIURETIC PEPTIDE: Pro Brain Natriuretic Peptide: 432 pg/mL — ABNORMAL HIGH

## 2024-10-05 LAB — MAGNESIUM: Magnesium: 2.3 mg/dL (ref 1.7–2.4)

## 2024-10-05 MED ORDER — SODIUM CHLORIDE 0.9 % IV SOLN: 250.0000 mL | INTRAVENOUS | Status: AC | PRN

## 2024-10-05 MED ORDER — PANTOPRAZOLE SODIUM 40 MG PO TBEC
40.0000 mg | DELAYED_RELEASE_TABLET | Freq: Every day | ORAL | Status: DC
  Administered 2024-10-06 – 2024-10-11 (×6): 40 mg via ORAL
  Filled 2024-10-05 (×6): qty 1

## 2024-10-05 MED ORDER — AMIODARONE HCL 200 MG PO TABS
200.0000 mg | ORAL_TABLET | Freq: Every day | ORAL | Status: DC
  Administered 2024-10-06 – 2024-10-11 (×6): 200 mg via ORAL
  Filled 2024-10-05 (×6): qty 1

## 2024-10-05 MED ORDER — METOPROLOL SUCCINATE ER 100 MG PO TB24
100.0000 mg | ORAL_TABLET | Freq: Every evening | ORAL | Status: DC
  Administered 2024-10-06 – 2024-10-09 (×4): 100 mg via ORAL
  Filled 2024-10-05 (×5): qty 1

## 2024-10-05 MED ORDER — AMLODIPINE BESYLATE 5 MG PO TABS
5.0000 mg | ORAL_TABLET | Freq: Every day | ORAL | Status: DC
  Administered 2024-10-05 – 2024-10-09 (×5): 5 mg via ORAL
  Filled 2024-10-05 (×6): qty 1

## 2024-10-05 MED ORDER — ACETAMINOPHEN 325 MG PO TABS: 650.0000 mg | ORAL_TABLET | ORAL | Status: DC | PRN

## 2024-10-05 MED ORDER — LEVOTHYROXINE SODIUM 50 MCG PO TABS
25.0000 ug | ORAL_TABLET | Freq: Every day | ORAL | Status: DC
  Administered 2024-10-06 – 2024-10-11 (×6): 25 ug via ORAL
  Filled 2024-10-05 (×6): qty 1

## 2024-10-05 MED ORDER — SODIUM CHLORIDE 0.9% FLUSH
3.0000 mL | Freq: Two times a day (BID) | INTRAVENOUS | Status: DC
  Administered 2024-10-05 – 2024-10-11 (×11): 3 mL via INTRAVENOUS

## 2024-10-05 MED ORDER — ASPIRIN 81 MG PO TBEC
81.0000 mg | DELAYED_RELEASE_TABLET | Freq: Every day | ORAL | Status: DC
  Administered 2024-10-06 – 2024-10-08 (×3): 81 mg via ORAL
  Filled 2024-10-05 (×3): qty 1

## 2024-10-05 MED ORDER — ONDANSETRON HCL 4 MG/2ML IJ SOLN: 4.0000 mg | Freq: Four times a day (QID) | INTRAMUSCULAR | Status: DC | PRN

## 2024-10-05 MED ORDER — MEXILETINE HCL 150 MG PO CAPS
150.0000 mg | ORAL_CAPSULE | Freq: Two times a day (BID) | ORAL | Status: DC
  Administered 2024-10-05 – 2024-10-11 (×12): 150 mg via ORAL
  Filled 2024-10-05 (×12): qty 1

## 2024-10-05 MED ORDER — ATORVASTATIN CALCIUM 80 MG PO TABS
80.0000 mg | ORAL_TABLET | Freq: Every day | ORAL | Status: DC
  Administered 2024-10-06 – 2024-10-11 (×6): 80 mg via ORAL
  Filled 2024-10-05 (×6): qty 1

## 2024-10-05 MED ORDER — SODIUM CHLORIDE 0.9% FLUSH: 3.0000 mL | INTRAVENOUS | Status: DC | PRN

## 2024-10-05 MED ORDER — DULOXETINE HCL 60 MG PO CPEP
60.0000 mg | ORAL_CAPSULE | Freq: Every day | ORAL | Status: DC
  Administered 2024-10-06 – 2024-10-11 (×6): 60 mg via ORAL
  Filled 2024-10-05 (×6): qty 1

## 2024-10-05 MED ORDER — HEPARIN SODIUM (PORCINE) 5000 UNIT/ML IJ SOLN
5000.0000 [IU] | Freq: Three times a day (TID) | INTRAMUSCULAR | Status: DC
  Administered 2024-10-05 – 2024-10-09 (×10): 5000 [IU] via SUBCUTANEOUS
  Filled 2024-10-05 (×10): qty 1

## 2024-10-05 NOTE — ED Triage Notes (Signed)
 Pt BIB [facility] as transfer from [facility] ED for defibrillator fire. Pt has no CP, no SHOB, has had all meds changed in September. Pt felt one shock while washing hands in bathroom. VSS per [facility].

## 2024-10-05 NOTE — ED Notes (Signed)
 [employer] representative at bedside at this time.

## 2024-10-05 NOTE — ED Provider Notes (Signed)
 "  EMERGENCY DEPARTMENT AT [facility] Provider Note   CSN: [id number] Arrival date & time: 10/05/24  1436     Patient presents with: Pacemaker Problem   CARRA BRINDLEY is a 69 y.o. female.  She has a history of coronary disease and has an AICD in place.  She said she was washing her hands in the bathroom when she received a shock.  There was no preceding symptoms.  She said there was 1 single shock.  She feels fine now.  Last time her AICD fired was back in September and she was admitted and had multiple medication changes.  She denies any recent illness or medication changes.  {Add pertinent medical, surgical, social history, OB history to YEP:67052} The history is provided by the patient.       Prior to Admission medications  Medication Sig Start Date End Date Taking? Authorizing Provider  amiodarone  (PACERONE ) 200 MG tablet Take 1 tablet (200 mg total) by mouth 2 (two) times daily. 07/11/24  Yes Wonda Sharper, MD  amLODipine  (NORVASC ) 5 MG tablet Take 1 tablet (5 mg total) by mouth at bedtime. 07/24/24 10/22/24 Yes West, Katlyn D, NP  atorvastatin  (LIPITOR) 80 MG tablet Take 1 tablet (80 mg total) by mouth daily. 08/26/24 11/24/24 Yes West, Katlyn D, NP  levothyroxine  (SYNTHROID ) 25 MCG tablet Take 25 mcg by mouth daily. 07/10/22  Yes [provider]  metoprolol  succinate (TOPROL -XL) 100 MG 24 hr tablet Take 1 tablet (100 mg total) by mouth every evening. Take with or immediately following a meal. 07/11/24  Yes Wonda Sharper, MD  acetaminophen  (TYLENOL ) 325 MG tablet Take 650 mg by mouth every 6 (six) hours as needed for mild pain or moderate pain.    [provider]  amiodarone  (PACERONE ) 200 MG tablet Take 2 tablets (400 mg total) by mouth in the morning and at bedtime for 4 days, THEN 1 tablet (200 mg total) in the morning and at bedtime for 5 days, THEN 1 tablet (200 mg total) daily. 06/23/24 08/23/24  Jillian Buttery, MD  amiodarone  (PACERONE ) 400 MG  tablet Take 1 tablet (400 mg total) by mouth 2 (two) times daily for 4 days. 06/14/24 08/23/24  Jillian Buttery, MD  aspirin  EC 81 MG tablet Take 1 tablet (81 mg total) by mouth daily. 08/12/20   Lelon Hamilton T, PA-C  DULoxetine  (CYMBALTA ) 60 MG capsule Take 60 mg by mouth daily.  11/25/14   [provider]  mexiletine (MEXITIL ) 150 MG capsule Take 1 capsule (150 mg total) by mouth every 12 (twelve) hours. 07/11/24   Wonda Sharper, MD  nitroGLYCERIN  (NITROSTAT ) 0.4 MG SL tablet Place 1 tablet (0.4 mg total) under the tongue every 5 (five) minutes x 3 doses as needed for chest pain. 06/14/24   West, Katlyn D, NP  pantoprazole  (PROTONIX ) 40 MG tablet Take 1 tablet (40 mg total) by mouth daily. 06/27/24   West, Katlyn D, NP    Allergies: Patient has no known allergies.    Review of Systems  Constitutional:  Negative for fever.  HENT:  Negative for sore throat.   Respiratory:  Negative for shortness of breath.   Cardiovascular:  Negative for chest pain.  Gastrointestinal:  Negative for abdominal pain.  Genitourinary:  Negative for dysuria.  Skin:  Negative for rash.    Updated Vital Signs BP 132/74 (BP Location: Right Arm)   Pulse (!) 59   Temp 99.8 F (37.7 C) (Oral)   Resp 18  Ht 5' 4 (1.626 m)   SpO2 100%   BMI 25.40 kg/m   Physical Exam Vitals and nursing note reviewed.  Constitutional:      General: She is not in acute distress.    Appearance: Normal appearance. She is well-developed.  HENT:     Head: Normocephalic and atraumatic.  Eyes:     Conjunctiva/sclera: Conjunctivae normal.  Cardiovascular:     Rate and Rhythm: Normal rate and regular rhythm.     Heart sounds: No murmur heard. Pulmonary:     Effort: Pulmonary effort is normal. No respiratory distress.     Breath sounds: Normal breath sounds.  Abdominal:     Palpations: Abdomen is soft.     Tenderness: There is no abdominal tenderness. There is no guarding or rebound.  Musculoskeletal:        General:  No swelling.     Cervical back: Neck supple.  Skin:    General: Skin is warm and dry.     Capillary Refill: Capillary refill takes less than 2 seconds.  Neurological:     General: No focal deficit present.     Mental Status: She is alert.     (all labs ordered are listed, but only abnormal results are displayed) Labs Reviewed - No data to display  EKG: EKG Interpretation Date/Time:  Saturday October 05 2024 15:05:13 EST Ventricular Rate:  57 PR Interval:  183 QRS Duration:  92 QT Interval:  428 QTC Calculation: 417 R Axis:   85  Text Interpretation: Sinus rhythm Borderline right axis deviation Anteroseptal infarct, old nonspecific STs compared with prior 10/25 Confirmed by Towana Sharper [phone]) on 10/05/2024 3:11:59 PM  Radiology: No results found.  {Document cardiac monitor, telemetry assessment procedure when appropriate:32947} Procedures   Medications Ordered in the ED - No data to display    {Click here for ABCD2, HEART and other calculators REFRESH Note before signing:1}                              Medical Decision Making Amount and/or Complexity of Data Reviewed Labs: ordered. Radiology: ordered.   This patient complains of ***; this involves an extensive number of treatment Options and is a complaint that carries with it a high risk of complications and morbidity. The differential includes ***  I ordered, reviewed and interpreted labs, which included *** I ordered medication *** and reviewed PMP when indicated. I ordered imaging studies which included *** and I independently    visualized and interpreted imaging which showed *** Additional history obtained from *** Previous records obtained and reviewed *** I consulted *** and discussed lab and imaging findings and discussed disposition.  Cardiac monitoring reviewed, *** Social determinants considered, *** Critical Interventions: ***  After the interventions stated above, I reevaluated the patient  and found *** Admission and further testing considered, ***   {Document critical care time when appropriate  Document review of labs and clinical decision tools ie CHADS2VASC2, etc  Document your independent review of radiology images and any outside records  Document your discussion with family members, caretakers and with consultants  Document social determinants of health affecting pt's care  Document your decision making why or why not admission, treatments were needed:32947:::1}   Final diagnoses:  None    ED Discharge Orders     None        "

## 2024-10-05 NOTE — ED Notes (Addendum)
 Patient placed on defibrillator pads due to fault in internal defibrillator. Representative from [employer] paged out at this time. Patient in NSR with normal HR.

## 2024-10-05 NOTE — H&P (Incomplete)
 "  Cardiology Admission History and Physical  Patient ID: [name] [name] MRN: [id number]; DOB: [birth year]   Admission date: 10/05/2024  PCP:  Freddrick No   [facility] Providers Cardiologist:  Ozell Fell, MD  Cardiology APP:  Lelon Glendia DASEN, PA-C  Electrophysiologist:  Will Gladis Norton, MD    Chief Complaint:  ICD shock  Patient Profile: [name] [name] is a 69 y.o. female with a history of ICM with HFrEF (LVEF 35-40% 05/2024) s/p MDT ICD in 2011, CAD s/p anterior MI in 2010 s/p PCI with BMS to LAD, prior VT/VF on amiodarone  and mexiletine who is being seen 10/05/2024 for the evaluation of inappropriate ICD shock.  History of Present Illness: Ms. Mordecai was in her usual state earlier this morning. Around 11am, she was washing her hands when she felt her device shock her. She did not have any preceding symptoms. She notes severe pain from the shock which has now improved. She is now feeling back to her baseline. No missed doses of medications. No recent trauma. She was seen at [facility] and had all her therapies turned off as she had an inappropriate shock.  Last interrogation session Oct. 14, 2025 2.6 years remaining  VVI LRL 40 bpm VS 100% VP <0.1% RV sensitivity 0.3mV  Impedance RV pacing (bipolar) >3000 ohms (new form 1/17) RV defib 83 ohms  Threshold: Tried to pace at [facility] but did not capture  Sensing R wave amplitude 11.4mV  All therapies off  VT/VF 1 with shock Monitored VT 357 episodes          Past Medical History:  Diagnosis Date   AICD (automatic cardioverter/defibrillator) present    Anemia    Cervical disc disease    disabled police officer   Coronary artery disease    Depression    Dyslipidemia    Dysrhythmia    GERD (gastroesophageal reflux disease)    ICD (implantable cardiac defibrillator), single MDT    treated with BMS to LAD   Ischemic cardiomyopathy    post-MI LVEF less than 30%; acute AMI Rx w BMS>>LAD   Myocardial  infarction [facility])    Uterine cancer [facility])    age 50, s/p partial hysterectomy   Past Surgical History:  Procedure Laterality Date   ABDOMINAL HYSTERECTOMY     BIOPSY  04/24/2020   Procedure: BIOPSY;  Surgeon: Dianna Specking, MD;  Location: [facility] ENDOSCOPY;  Service: Endoscopy;;   CERVICAL DISC SURGERY     COLONOSCOPY WITH PROPOFOL  N/A 04/24/2020   Procedure: COLONOSCOPY WITH PROPOFOL ;  Surgeon: Dianna Specking, MD;  Location: [facility] ENDOSCOPY;  Service: Endoscopy;  Laterality: N/A;   CORONARY ANGIOPLASTY WITH STENT PLACEMENT     ESOPHAGOGASTRODUODENOSCOPY (EGD) WITH PROPOFOL  N/A 04/24/2020   Procedure: ESOPHAGOGASTRODUODENOSCOPY (EGD) WITH PROPOFOL ;  Surgeon: Dianna Specking, MD;  Location: [facility] ENDOSCOPY;  Service: Endoscopy;  Laterality: N/A;   HOT HEMOSTASIS N/A 04/24/2020   Procedure: HOT HEMOSTASIS (ARGON PLASMA COAGULATION/BICAP);  Surgeon: Dianna Specking, MD;  Location: [facility] ENDOSCOPY;  Service: Endoscopy;  Laterality: N/A;   ICD GENERATOR CHANGEOUT N/A 11/17/2017   Procedure: ICD GENERATOR CHANGEOUT;  Surgeon: Fernande Elspeth BROCKS, MD;  Location: [facility] INVASIVE CV LAB;  Service: Cardiovascular;  Laterality: N/A;   icd implanted     LEFT HEART CATH AND CORONARY ANGIOGRAPHY N/A 06/12/2024   Procedure: LEFT HEART CATH AND CORONARY ANGIOGRAPHY;  Surgeon: Elmira Newman PARAS, MD;  Location: [facility] INVASIVE CV LAB;  Service: Cardiovascular;  Laterality: N/A;   MASTECTOMY, PARTIAL Right  PARTIAL HYSTERECTOMY     POLYPECTOMY  04/24/2020   Procedure: POLYPECTOMY;  Surgeon: Dianna Specking, MD;  Location: [facility] ENDOSCOPY;  Service: Endoscopy;;   SUBMUCOSAL TATTOO INJECTION  04/24/2020   Procedure: SUBMUCOSAL TATTOO INJECTION;  Surgeon: Dianna Specking, MD;  Location: [facility] ENDOSCOPY;  Service: Endoscopy;;    Medications Prior to Admission: Prior to Admission medications  Medication Sig Start Date End Date Taking? Authorizing Provider  amiodarone  (PACERONE ) 200 MG tablet Take 1 tablet (200 mg total)  by mouth 2 (two) times daily. 07/11/24  Yes Wonda Sharper, MD  amLODipine  (NORVASC ) 5 MG tablet Take 1 tablet (5 mg total) by mouth at bedtime. 07/24/24 10/22/24 Yes West, Katlyn D, NP  atorvastatin  (LIPITOR) 80 MG tablet Take 1 tablet (80 mg total) by mouth daily. 08/26/24 11/24/24 Yes West, Katlyn D, NP  levothyroxine  (SYNTHROID ) 25 MCG tablet Take 25 mcg by mouth daily. 07/10/22  Yes [provider]  metoprolol  succinate (TOPROL -XL) 100 MG 24 hr tablet Take 1 tablet (100 mg total) by mouth every evening. Take with or immediately following a meal. 07/11/24  Yes Wonda Sharper, MD  acetaminophen  (TYLENOL ) 325 MG tablet Take 650 mg by mouth every 6 (six) hours as needed for mild pain or moderate pain.    [provider]  amiodarone  (PACERONE ) 200 MG tablet Take 2 tablets (400 mg total) by mouth in the morning and at bedtime for 4 days, THEN 1 tablet (200 mg total) in the morning and at bedtime for 5 days, THEN 1 tablet (200 mg total) daily. 06/23/24 08/23/24  Jillian Buttery, MD  amiodarone  (PACERONE ) 400 MG tablet Take 1 tablet (400 mg total) by mouth 2 (two) times daily for 4 days. 06/14/24 08/23/24  Jillian Buttery, MD  aspirin  EC 81 MG tablet Take 1 tablet (81 mg total) by mouth daily. 08/12/20   Lelon Hamilton T, PA-C  DULoxetine  (CYMBALTA ) 60 MG capsule Take 60 mg by mouth daily.  11/25/14   [provider]  mexiletine (MEXITIL ) 150 MG capsule Take 1 capsule (150 mg total) by mouth every 12 (twelve) hours. 07/11/24   Wonda Sharper, MD  nitroGLYCERIN  (NITROSTAT ) 0.4 MG SL tablet Place 1 tablet (0.4 mg total) under the tongue every 5 (five) minutes x 3 doses as needed for chest pain. 06/14/24   West, Katlyn D, NP  pantoprazole  (PROTONIX ) 40 MG tablet Take 1 tablet (40 mg total) by mouth daily. 06/27/24   West, Katlyn D, NP    Allergies:   Allergies[1]  Social History:   Social History   Socioeconomic History   Marital status: Divorced    Spouse name: Not on file   Number  of children: 4   Years of education: 13   Highest education level: Not on file  Occupational History   Not on file  Tobacco Use   Smoking status: Former    Current packs/day: 0.00    Average packs/day: 0.5 packs/day for 20.0 years (10.0 ttl pk-yrs)    Types: Cigarettes    Start date: 37    Quit date: 2019    Years since quitting: 7.0   Smokeless tobacco: Never  Vaping Use   Vaping status: Never Used  Substance and Sexual Activity   Alcohol use: Yes    Comment: rare   Drug use: No   Sexual activity: Not Currently    Birth control/protection: None  Other Topics Concern   Not on file  Social History Narrative   Not on file   Social Drivers of  Health   Tobacco Use: Medium Risk (10/05/2024)   Patient History    Smoking Tobacco Use: Former    Smokeless Tobacco Use: Never    Passive Exposure: Not on file  Financial Resource Strain: Low Risk (06/21/2024)   Received from [facility]   Overall Financial Resource Strain (CARDIA)    How hard is it for you to pay for the very basics like food, housing, medical care, and heating?: Not very hard  Food Insecurity: Patient Declined (06/21/2024)   Received from [facility]   Epic    Within the past 12 months, you worried that your food would run out before you got the money to buy more.: Patient declined    Within the past 12 months, the food you bought just didn't last and you didn't have money to get more.: Patient declined  Transportation Needs: No Transportation Needs (06/21/2024)   Received from [facility]    In the past 12 months, has lack of transportation kept you from medical appointments or from getting medications?: No    In the past 12 months, has lack of transportation kept you from meetings, work, or from getting things needed for daily living?: No  Physical Activity: Inactive (06/21/2024)   Received from [facility]   Exercise Vital Sign    On average, how many days per week do you  engage in moderate to strenuous exercise (like a brisk walk)?: 0 days    Minutes of Exercise per Session: Not on file  Stress: Stress Concern Present (06/21/2024)   Received from [facility] - Occupational Stress Questionnaire    Do you feel stress - tense, restless, nervous, or anxious, or unable to sleep at night because your mind is troubled all the time - these days?: Very much  Social Connections: Somewhat Isolated (06/21/2024)   Received from [facility]   Social Network    How would you rate your social network (family, work, friends)?: Restricted participation with some degree of social isolation  Intimate Partner Violence: Patient Declined (06/21/2024)   Received from [facility]   HITS    Over the last 12 months how often did your partner physically hurt you?: Patient declined    Over the last 12 months how often did your partner insult you or talk down to you?: Patient declined    Over the last 12 months how often did your partner threaten you with physical harm?: Patient declined    Over the last 12 months how often did your partner scream or curse at you?: Patient declined  Depression (EYV7-0): Not on file  Alcohol Screen: Not on file  Housing: Low Risk (06/21/2024)   Received [facility]    In the last 12 months, was there a time when you were not able to pay the mortgage or rent on time?: No    In the past 12 months, how many times have you moved where you were living?: 0    At any time in the past 12 months, were you homeless or living in a shelter (including now)?: No  Utilities: Not At Risk (06/21/2024)   Received from [facility]    In the past 12 months has the electric, gas, oil, or water  company threatened to shut off services in your home?: No  Health Literacy: Not on file    Family History:   The patient's family history includes  Heart attack in her mother. There is no history of  Cancer.    ROS:  Please see the history of present illness.  All other ROS reviewed and negative.     Physical Exam/Data: Vitals:   10/05/24 1453 10/05/24 1457 10/05/24 1700 10/05/24 1924  BP:  132/74 122/60 (!) 142/64  Pulse:  (!) 59 (!) 59 63  Resp:  18 15 13   Temp:  99.8 F (37.7 C)  98.7 F (37.1 C)  TempSrc:  Oral  Oral  SpO2:  100% 98% 100%  Height: 5' 4 (1.626 m)      No intake or output data in the 24 hours ending 10/05/24 1943    08/23/2024    2:35 PM 07/19/2024    2:22 PM 07/02/2024    9:10 AM  Last 3 Weights  Weight (lbs) 148 lb 150 lb 9.6 oz 148 lb 3.2 oz  Weight (kg) 67.132 kg 68.312 kg 67.223 kg     Body mass index is 25.4 kg/m.  General:  Well nourished, well developed, in no acute distress HEENT: normal Neck: no JVD Vascular: No carotid bruits; Distal pulses 2+ bilaterally   Cardiac:  normal S1, S2; RRR; no murmur Lungs:  clear to auscultation bilaterally, no wheezing, rhonchi or rales  Abd: soft, nontender, no hepatomegaly  Ext: no edema Musculoskeletal:  No deformities, BUE and BLE strength normal and equal Skin: warm and dry  Neuro:  no focal abnormalities noted Psych:  Normal affect   EKG:  The ECG that was done was personally reviewed and demonstrates sinus bradycardia, nonspecific STT wave abnormalities, q wave V1, V2  Relevant CV Studies: Coronary angiography 06/12/2024: LM: Normal LAD: Patent prox-mid stent with only 20-30% late lumen loss Lcx: Large dominant vessel with mild OM2, mid OM disease        Severe diffuse disease in small caliber distal Lcx/OM branches RCA: Small nondominant vessel with mild diffuse disease   LVEDP 26 mmHg  Conclusion: No severe proximal epicardial disease Diffuse disease in distal Lcx/OM branches not amenable to PCI and unlikely to be the cause of low EF  Continue GMDT for HFrEF and polymorphic VT  TTE 06/11/24 IMPRESSIONS   1. Left ventricular ejection fraction, by estimation, is 35 to 40%. The  left  ventricle has moderately decreased function. The left ventricle  demonstrates regional wall motion abnormalities (see scoring  diagram/findings for description). Left ventricular   diastolic parameters were normal.   2. No formed LV mural thrombus evident by Definity  contrast. Transverse  false tendon at apex.   3. Right ventricular systolic function is normal. The right ventricular  size is normal. Tricuspid regurgitation signal is inadequate for assessing  PA pressure.   4. The mitral valve is degenerative. Trivial mitral valve regurgitation.   5. The aortic valve was not well visualized. There is mild calcification  of the aortic valve. Aortic valve regurgitation is not visualized.   6. The inferior vena cava is normal in size with greater than 50%  respiratory variability, suggesting right atrial pressure of 3 mmHg.   Laboratory Data: Chemistry Recent Labs  Lab 09/30/24 0858 10/05/24 1536  NA 143 139  K 4.7 3.8  CL 105 104  CO2 22 21*  GLUCOSE 103* 100*  BUN 18 19  CREATININE 1.72* 1.81*  CALCIUM  9.1 8.8*  MG  --  2.3  GFRNONAA  --  30*  ANIONGAP  --  14    Hematology Recent Labs  Lab 10/05/24 1536  WBC  8.8  RBC 4.42  HGB 12.3  HCT 40.0  MCV 90.5  MCH 27.8  MCHC 30.8  RDW 13.5  PLT 213   BNP Recent Labs  Lab 10/05/24 1536  PROBNP 432.0*   Radiology/Studies:  DG Chest Port 1 View Result Date: 10/05/2024 CLINICAL DATA:  Defibrillator shock. EXAM: PORTABLE CHEST 1 VIEW COMPARISON:  06/10/2024. FINDINGS: The heart size and mediastinal contours are within normal limits. There is atherosclerotic calcification of the aorta. No consolidation, effusion, or pneumothorax is seen. Cervical spinal fusion hardware is noted. An AICD is present over the left chest. IMPRESSION: No active disease. Electronically Signed   By: Leita Birmingham M.D.   On: 10/05/2024 15:51   Assessment and Plan: Concern for lead fracture Increased RV lead impedance  Failure to capture   Inappropriate ICD defibrillation Oversensing  The patient had an inappropriate ICD shock earlier this morning which is clearly evident on the ICD interrogation (EGMs pasted into HPI). Additionally, there is failure to capture with max output of the device (8V).    Ischemic cardiomyopathy with HFrEF  NYHA II. Euvolemic on exam. LVEF from 05/2024 35%. Adherent with GDMT. Not on ACE/ARB/ARNI or MRA due to isolated hyperkalemia of 5.7 in October of 2025. Not on SGLT2i. - Continue metoprolol  XL 100mg  daily - Consider starting ACE/ARB/ARNI, MRA, and/or SGLT2i this admission   CAD s/p PCI in 2010 with BMS to LAD Coronary angiogram in 05/2024 with distal disease of Lcx and OM not amendable to disease - Continue aspirin   History of VT/VF in 05/2024 Recent admission in September of 2025 with appropriate ICD treatments of VT/VF. Improved with amiodarone  and mexiletine. Coronary angiogram without significant CAD. - Continue mexiletine 150 mg q12hrs - Continue amiodarone  (pharmacy consult for dosing, either 200mg  dialy or 200 mg bid) - Continue metoprolol  as above  Hypertension - Continue amlodipine   Depression  - Continue Cymbalta   GERD - Continue pantoprazole     Risk Assessment/Risk Scores:  [place]  Heart Association (NYHA) Functional Class NYHA Class II   Code Status: Full Code  Severity of Illness: The appropriate patient status for this patient is INPATIENT. Inpatient status is judged to be reasonable and necessary in order to provide the required intensity of service to ensure the patient's safety. The patient's presenting symptoms, physical exam findings, and initial radiographic and laboratory data in the context of their chronic comorbidities is felt to place them at high risk for further clinical deterioration. Furthermore, it is not anticipated that the patient will be medically stable for discharge from the hospital within 2 midnights of admission.   * I certify that at the  point of admission it is my clinical judgment that the patient will require inpatient hospital care spanning beyond 2 midnights from the point of admission due to high intensity of service, high risk for further deterioration and high frequency of surveillance required.*  For questions or updates, please contact [facility] Please consult [email or web address] for contact info under    Signed, Jerrell DELENA Orchard, MD  10/05/2024 7:43 PM         [1] No Known Allergies "

## 2024-10-05 NOTE — ED Provider Notes (Signed)
 " [facility] Provider Note   CSN: [id number] Arrival date & time: 10/05/24  1436     Patient presents with: Pacemaker Problem   [name] [name] is a 69 y.o. female with a history notable for CAD s/p anterior MI in 2010 treated with PCI (BMS to LAD), ischemic cardiomyopathy/HFrEF, VT/SVT s/p ICD with [employer] implanted for primary prevention in 06/2010 with generator change in 11/2017, hyperlipidemia, CKD, GERD who presents today for evaluation of ICD problems.  Patient was at home when she experienced a single shock of her ICD.  Had no prodromal symptoms.  Has otherwise been in her normal baseline.  Initially went to an outside emergency department and underwent pacemaker interrogation with [employer] device rep concerns for inappropriate discharge and potential lead fracture.  ICD was subsequently inactivated.  Was subsequent sent to our facility for further cardiology evaluation.  Patient reports that she has not experienced any additional shocks and currently feels at her baseline.  HPI     Prior to Admission medications  Medication Sig Start Date End Date Taking? Authorizing Provider  amiodarone  (PACERONE ) 200 MG tablet Take 1 tablet (200 mg total) by mouth 2 (two) times daily. 07/11/24  Yes Wonda Sharper, MD  amLODipine  (NORVASC ) 5 MG tablet Take 1 tablet (5 mg total) by mouth at bedtime. 07/24/24 10/22/24 Yes West, Katlyn D, NP  atorvastatin  (LIPITOR) 80 MG tablet Take 1 tablet (80 mg total) by mouth daily. 08/26/24 11/24/24 Yes West, Katlyn D, NP  levothyroxine  (SYNTHROID ) 25 MCG tablet Take 25 mcg by mouth daily. 07/10/22  Yes [provider]  metoprolol  succinate (TOPROL -XL) 100 MG 24 hr tablet Take 1 tablet (100 mg total) by mouth every evening. Take with or immediately following a meal. 07/11/24  Yes Wonda Sharper, MD  acetaminophen  (TYLENOL ) 325 MG tablet Take 650 mg by mouth every 6 (six) hours as needed for mild pain or moderate  pain.    [provider]  amiodarone  (PACERONE ) 200 MG tablet Take 2 tablets (400 mg total) by mouth in the morning and at bedtime for 4 days, THEN 1 tablet (200 mg total) in the morning and at bedtime for 5 days, THEN 1 tablet (200 mg total) daily. 06/23/24 08/23/24  Jillian Buttery, MD  amiodarone  (PACERONE ) 400 MG tablet Take 1 tablet (400 mg total) by mouth 2 (two) times daily for 4 days. 06/14/24 08/23/24  Jillian Buttery, MD  aspirin  EC 81 MG tablet Take 1 tablet (81 mg total) by mouth daily. 08/12/20   Lelon Hamilton T, PA-C  DULoxetine  (CYMBALTA ) 60 MG capsule Take 60 mg by mouth daily.  11/25/14   [provider]  mexiletine (MEXITIL ) 150 MG capsule Take 1 capsule (150 mg total) by mouth every 12 (twelve) hours. 07/11/24   Wonda Sharper, MD  nitroGLYCERIN  (NITROSTAT ) 0.4 MG SL tablet Place 1 tablet (0.4 mg total) under the tongue every 5 (five) minutes x 3 doses as needed for chest pain. 06/14/24   West, Katlyn D, NP  pantoprazole  (PROTONIX ) 40 MG tablet Take 1 tablet (40 mg total) by mouth daily. 06/27/24   West, Katlyn D, NP    Allergies: Patient has no known allergies.    Review of Systems  Updated Vital Signs BP 122/60   Pulse (!) 59   Temp 99.8 F (37.7 C) (Oral)   Resp 15   Ht 5' 4 (1.626 m)   SpO2 98%   BMI 25.40 kg/m   Physical Exam Constitutional:  General: She is not in acute distress.    Appearance: Normal appearance.  HENT:     Head: Normocephalic.     Right Ear: External ear normal.     Left Ear: External ear normal.     Nose: Nose normal.     Mouth/Throat:     Mouth: Mucous membranes are moist.  Eyes:     Pupils: Pupils are equal, round, and reactive to light.  Cardiovascular:     Rate and Rhythm: Normal rate and regular rhythm.     Pulses: Normal pulses.  Pulmonary:     Effort: Pulmonary effort is normal.     Breath sounds: Normal breath sounds.  Abdominal:     General: Abdomen is flat.     Palpations: Abdomen is soft.   Musculoskeletal:        General: Normal range of motion.     Cervical back: Normal range of motion.  Skin:    General: Skin is warm.     Capillary Refill: Capillary refill takes less than 2 seconds.  Neurological:     General: No focal deficit present.     Mental Status: She is alert and oriented to person, place, and time. Mental status is at baseline.  Psychiatric:        Mood and Affect: Mood normal.     (all labs ordered are listed, but only abnormal results are displayed) Labs Reviewed  BASIC METABOLIC PANEL WITH GFR - Abnormal; Notable for the following components:      Result Value   CO2 21 (*)    Glucose, Bld 100 (*)    Creatinine, Ser 1.81 (*)    Calcium  8.8 (*)    GFR, Estimated 30 (*)    All other components within normal limits  PRO BRAIN NATRIURETIC PEPTIDE - Abnormal; Notable for the following components:   Pro Brain Natriuretic Peptide 432.0 (*)    All other components within normal limits  CBC WITH DIFFERENTIAL/PLATELET  MAGNESIUM     EKG: EKG Interpretation Date/Time:  Saturday October 05 2024 15:05:13 EST Ventricular Rate:  57 PR Interval:  183 QRS Duration:  92 QT Interval:  428 QTC Calculation: 417 R Axis:   85  Text Interpretation: Sinus rhythm Borderline right axis deviation Anteroseptal infarct, old nonspecific STs compared with prior 10/25 Confirmed by Towana Sharper [phone]) on 10/05/2024 3:11:59 PM  Radiology: [facility] Chest Port 1 View Result Date: 10/05/2024 CLINICAL DATA:  Defibrillator shock. EXAM: PORTABLE CHEST 1 VIEW COMPARISON:  06/10/2024. FINDINGS: The heart size and mediastinal contours are within normal limits. There is atherosclerotic calcification of the aorta. No consolidation, effusion, or pneumothorax is seen. Cervical spinal fusion hardware is noted. An AICD is present over the left chest. IMPRESSION: No active disease. Electronically Signed   By: Leita Birmingham M.D.   On: 10/05/2024 15:51    Procedures   Medications Ordered in  the ED - No data to display  Clinical Course as of 10/05/24 1922  Sat Oct 05, 2024  1546 Chest x-ray interpreted by me as AICD in place no gross infiltrate.  May be mild edema.  Awaiting radiology reading. [MB]  1624 Received a call from the representative for the AICD.  His interpretation is that there may be a lead fracture and patient received an advertent AICD shock.  He is recommending magnet to deactivate and pacer pads to monitor.  He is can to come by to formally deactivate.  I have placed a call into cardiology for consult.  Patient updated on plan. [MB]  1636 Discussed with cardiology Dr. Nancey.  He is recommending magnet, pacer pads.  Transfer ED to ED to [facility] where cardiology can evaluate and make a decision whether she needs to be admitted. [MB]  1800 Patient accepted by Dr. Emil to [facility] ED for further cardiology evaluation.  [employer] representative came to bedside and deactivated her AICD for now to prevent inappropriate shocks. [MB]    Clinical Course User Index [MB] Towana Ozell BROCKS, MD                                 Medical Decision Making Amount and/or Complexity of Data Reviewed Labs: ordered. Radiology: ordered.  Risk Decision regarding hospitalization.   Patient is a 69 year old female who presents today for evaluation of ICD concerns.  On initial assessment patient was noted to be hemodynamically stable and afebrile.  On bedside assessment patient was noted be resting comfortably with no acute distress.  Physical examination without any acute abnormalities.  Patient already had a completed workup at outside hospital with slightly increased BNP.  Cardiology was already aware of this patient.  I did contact them to let them know that she was here in the hospital.  They kindly evaluate the patient at bedside.  Recommendations at this time for admission for further potential intervention for suspected lead fracture of her ICD.  Patient was admitted to the cardiology  service.   Final diagnoses:  Inappropriate discharge of implantable cardioverter-defibrillator (ICD), initial encounter    ED Discharge Orders     None          Laurita Sieving, MD 10/05/24 2316  "

## 2024-10-05 NOTE — H&P (Signed)
 "  Cardiology Admission History and Physical  Patient ID: [name] [name] MRN: [id number]; DOB: [birth year]   Admission date: 10/05/2024  PCP:  Freddrick No    [facility] Providers Cardiologist:  Ozell Fell, MD  Cardiology APP:  Lelon Glendia DASEN, PA-C  Electrophysiologist:  Will Gladis Norton, MD    Chief Complaint:  ICD shock  Patient Profile: [name] [name] is a 69 y.o. female with a history of ICM with HFrEF (LVEF 35-40% 05/2024) s/p MDT ICD in 2011, CAD s/p anterior MI in 2010 s/p PCI with BMS to LAD, prior VT/VF on amiodarone  and mexiletine who is being seen 10/05/2024 for the evaluation of inappropriate ICD shock.  History of Present Illness: [name] [name] was in her usual state earlier this morning. Around 11am, she was washing her hands when she felt her device shock her. She did not have any preceding symptoms. She notes severe pain from the shock which has now improved. She is now feeling back to her baseline. No missed doses of medications. No recent trauma. She was seen at [facility] and had all her therapies turned off as she had an inappropriate shock.  Last interrogation session Oct. 14, 2025 2.6 years remaining  VVI LRL 40 bpm VS 100% VP <0.1% RV sensitivity 0.43mV  Impedance RV pacing (bipolar) >3000 ohms (new form 1/17) RV defib 83 ohms  Threshold: Tried to pace at [facility] but did not capture  Sensing R wave amplitude 11.4mV  All therapies off  VT/VF 1 with shock Monitored VT 357 episodes          Past Medical History:  Diagnosis Date   AICD (automatic cardioverter/defibrillator) present    Anemia    Cervical disc disease    disabled police officer   Coronary artery disease    Depression    Dyslipidemia    Dysrhythmia    GERD (gastroesophageal reflux disease)    ICD (implantable cardiac defibrillator), single MDT    treated with BMS to LAD   Ischemic cardiomyopathy    post-MI LVEF less than 30%; acute AMI Rx w BMS>>LAD   Myocardial infarction [facility])     Uterine cancer [facility])    age 59, s/p partial hysterectomy   Past Surgical History:  Procedure Laterality Date   ABDOMINAL HYSTERECTOMY     BIOPSY  04/24/2020   Procedure: BIOPSY;  Surgeon: [name] Specking, MD;  Location: [facility] ENDOSCOPY;  Service: Endoscopy;;   CERVICAL DISC SURGERY     COLONOSCOPY WITH PROPOFOL  N/A 04/24/2020   Procedure: COLONOSCOPY WITH PROPOFOL ;  Surgeon: [name] Specking, MD;  Location: [facility] ENDOSCOPY;  Service: Endoscopy;  Laterality: N/A;   CORONARY ANGIOPLASTY WITH STENT PLACEMENT     ESOPHAGOGASTRODUODENOSCOPY (EGD) WITH PROPOFOL  N/A 04/24/2020   Procedure: ESOPHAGOGASTRODUODENOSCOPY (EGD) WITH PROPOFOL ;  Surgeon: [name] Specking, MD;  Location: [facility] ENDOSCOPY;  Service: Endoscopy;  Laterality: N/A;   HOT HEMOSTASIS N/A 04/24/2020   Procedure: HOT HEMOSTASIS (ARGON PLASMA COAGULATION/BICAP);  Surgeon: [name] Specking, MD;  Location: [facility] ENDOSCOPY;  Service: Endoscopy;  Laterality: N/A;   ICD GENERATOR CHANGEOUT N/A 11/17/2017   Procedure: ICD GENERATOR CHANGEOUT;  Surgeon: [name] Elspeth BROCKS, MD;  Location: [facility] INVASIVE CV LAB;  Service: Cardiovascular;  Laterality: N/A;   icd implanted     LEFT HEART CATH AND CORONARY ANGIOGRAPHY N/A 06/12/2024   Procedure: LEFT HEART CATH AND CORONARY ANGIOGRAPHY;  Surgeon: [name] Newman PARAS, MD;  Location: [facility] INVASIVE CV LAB;  Service: Cardiovascular;  Laterality: N/A;   MASTECTOMY, PARTIAL Right  PARTIAL HYSTERECTOMY     POLYPECTOMY  04/24/2020   Procedure: POLYPECTOMY;  Surgeon: [name] Specking, MD;  Location: [facility] ENDOSCOPY;  Service: Endoscopy;;   SUBMUCOSAL TATTOO INJECTION  04/24/2020   Procedure: SUBMUCOSAL TATTOO INJECTION;  Surgeon: [name] Specking, MD;  Location: [facility] ENDOSCOPY;  Service: Endoscopy;;    Medications Prior to Admission: Prior to Admission medications  Medication Sig Start Date End Date Taking? Authorizing Provider  amiodarone  (PACERONE ) 200 MG tablet Take 1 tablet (200 mg total) by mouth 2 (two) times daily.  07/11/24  Yes Wonda Sharper, MD  amLODipine  (NORVASC ) 5 MG tablet Take 1 tablet (5 mg total) by mouth at bedtime. 07/24/24 10/22/24 Yes West, Katlyn D, NP  atorvastatin  (LIPITOR) 80 MG tablet Take 1 tablet (80 mg total) by mouth daily. 08/26/24 11/24/24 Yes West, Katlyn D, NP  levothyroxine  (SYNTHROID ) 25 MCG tablet Take 25 mcg by mouth daily. 07/10/22  Yes [provider]  metoprolol  succinate (TOPROL -XL) 100 MG 24 hr tablet Take 1 tablet (100 mg total) by mouth every evening. Take with or immediately following a meal. 07/11/24  Yes Wonda Sharper, MD  acetaminophen  (TYLENOL ) 325 MG tablet Take 650 mg by mouth every 6 (six) hours as needed for mild pain or moderate pain.    [provider]  amiodarone  (PACERONE ) 200 MG tablet Take 2 tablets (400 mg total) by mouth in the morning and at bedtime for 4 days, THEN 1 tablet (200 mg total) in the morning and at bedtime for 5 days, THEN 1 tablet (200 mg total) daily. 06/23/24 08/23/24  Jillian Buttery, MD  amiodarone  (PACERONE ) 400 MG tablet Take 1 tablet (400 mg total) by mouth 2 (two) times daily for 4 days. 06/14/24 08/23/24  Jillian Buttery, MD  aspirin  EC 81 MG tablet Take 1 tablet (81 mg total) by mouth daily. 08/12/20   Lelon Hamilton T, PA-C  DULoxetine  (CYMBALTA ) 60 MG capsule Take 60 mg by mouth daily.  11/25/14   [provider]  mexiletine (MEXITIL ) 150 MG capsule Take 1 capsule (150 mg total) by mouth every 12 (twelve) hours. 07/11/24   Wonda Sharper, MD  nitroGLYCERIN  (NITROSTAT ) 0.4 MG SL tablet Place 1 tablet (0.4 mg total) under the tongue every 5 (five) minutes x 3 doses as needed for chest pain. 06/14/24   West, Katlyn D, NP  pantoprazole  (PROTONIX ) 40 MG tablet Take 1 tablet (40 mg total) by mouth daily. 06/27/24   West, Katlyn D, NP    Allergies:   Allergies[1]  Social History:   Social History   Socioeconomic History   Marital status: Divorced    Spouse name: Not on file   Number of children: 4   Years of  education: 13   Highest education level: Not on file  Occupational History   Not on file  Tobacco Use   Smoking status: Former    Current packs/day: 0.00    Average packs/day: 0.5 packs/day for 20.0 years (10.0 ttl pk-yrs)    Types: Cigarettes    Start date: 20    Quit date: 2019    Years since quitting: 7.0   Smokeless tobacco: Never  Vaping Use   Vaping status: Never Used  Substance and Sexual Activity   Alcohol use: Yes    Comment: rare   Drug use: No   Sexual activity: Not Currently    Birth control/protection: None  Other Topics Concern   Not on file  Social History Narrative   Not on file   Social Drivers of  Health   Tobacco Use: Medium Risk (10/05/2024)   Patient History    Smoking Tobacco Use: Former    Smokeless Tobacco Use: Never    Passive Exposure: Not on file  Financial Resource Strain: Low Risk (06/21/2024)   Received from [facility]   Overall Financial Resource Strain (CARDIA)    How hard is it for you to pay for the very basics like food, housing, medical care, and heating?: Not very hard  Food Insecurity: Patient Declined (06/21/2024)   Received from [facility]   Epic    Within the past 12 months, you worried that your food would run out before you got the money to buy more.: Patient declined    Within the past 12 months, the food you bought just didn't last and you didn't have money to get more.: Patient declined  Transportation Needs: No Transportation Needs (06/21/2024)   Received from [facility]    In the past 12 months, has lack of transportation kept you from medical appointments or from getting medications?: No    In the past 12 months, has lack of transportation kept you from meetings, work, or from getting things needed for daily living?: No  Physical Activity: Inactive (06/21/2024)   Received from [facility]   Exercise Vital Sign    On average, how many days per week do you engage in moderate to strenuous exercise (like a  brisk walk)?: 0 days    Minutes of Exercise per Session: Not on file  Stress: Stress Concern Present (06/21/2024)   Received from [facility] - Occupational Stress Questionnaire    Do you feel stress - tense, restless, nervous, or anxious, or unable to sleep at night because your mind is troubled all the time - these days?: Very much  Social Connections: Somewhat Isolated (06/21/2024)   Received from [facility]   Social Network    How would you rate your social network (family, work, friends)?: Restricted participation with some degree of social isolation  Intimate Partner Violence: Patient Declined (06/21/2024)   Received from [facility]   HITS    Over the last 12 months how often did your partner physically hurt you?: Patient declined    Over the last 12 months how often did your partner insult you or talk down to you?: Patient declined    Over the last 12 months how often did your partner threaten you with physical harm?: Patient declined    Over the last 12 months how often did your partner scream or curse at you?: Patient declined  Depression (EYV7-0): Not on file  Alcohol Screen: Not on file  Housing: Low Risk (06/21/2024)   Received from [facility]    In the last 12 months, was there a time when you were not able to pay the mortgage or rent on time?: No    In the past 12 months, how many times have you moved where you were living?: 0    At any time in the past 12 months, were you homeless or living in a shelter (including now)?: No  Utilities: Not At Risk (06/21/2024)   Received from [facility]    In the past 12 months has the electric, gas, oil, or water  company threatened to shut off services in your home?: No  Health Literacy: Not on file    Family History:   The patient's family history includes  Heart attack in her mother. There is no history of Cancer.    ROS:  Please see the history of present illness.   All other ROS reviewed and negative.     Physical Exam/Data: Vitals:   10/05/24 1453 10/05/24 1457 10/05/24 1700 10/05/24 1924  BP:  132/74 122/60 (!) 142/64  Pulse:  (!) 59 (!) 59 63  Resp:  18 15 13   Temp:  99.8 F (37.7 C)  98.7 F (37.1 C)  TempSrc:  Oral  Oral  SpO2:  100% 98% 100%  Height: 5' 4 (1.626 m)      No intake or output data in the 24 hours ending 10/05/24 1943    08/23/2024    2:35 PM 07/19/2024    2:22 PM 07/02/2024    9:10 AM  Last 3 Weights  Weight (lbs) 148 lb 150 lb 9.6 oz 148 lb 3.2 oz  Weight (kg) 67.132 kg 68.312 kg 67.223 kg     Body mass index is 25.4 kg/m.  General:  Well nourished, well developed, in no acute distress HEENT: normal Neck: no JVD Vascular: No carotid bruits; Distal pulses 2+ bilaterally   Cardiac:  normal S1, S2; RRR; no murmur Lungs:  clear to auscultation bilaterally, no wheezing, rhonchi or rales  Abd: soft, nontender, no hepatomegaly  Ext: no edema Musculoskeletal:  No deformities, BUE and BLE strength normal and equal Skin: warm and dry  Neuro:  no focal abnormalities noted Psych:  Normal affect   EKG:  The ECG that was done was personally reviewed and demonstrates sinus bradycardia, nonspecific STT wave abnormalities, q wave V1, V2  Relevant CV Studies: Coronary angiography 06/12/2024: LM: Normal LAD: Patent prox-mid stent with only 20-30% late lumen loss Lcx: Large dominant vessel with mild OM2, mid OM disease        Severe diffuse disease in small caliber distal Lcx/OM branches RCA: Small nondominant vessel with mild diffuse disease   LVEDP 26 mmHg  Conclusion: No severe proximal epicardial disease Diffuse disease in distal Lcx/OM branches not amenable to PCI and unlikely to be the cause of low EF  Continue GMDT for HFrEF and polymorphic VT  TTE 06/11/24 IMPRESSIONS   1. Left ventricular ejection fraction, by estimation, is 35 to 40%. The  left ventricle has moderately decreased function. The left  ventricle  demonstrates regional wall motion abnormalities (see scoring  diagram/findings for description). Left ventricular   diastolic parameters were normal.   2. No formed LV mural thrombus evident by Definity  contrast. Transverse  false tendon at apex.   3. Right ventricular systolic function is normal. The right ventricular  size is normal. Tricuspid regurgitation signal is inadequate for assessing  PA pressure.   4. The mitral valve is degenerative. Trivial mitral valve regurgitation.   5. The aortic valve was not well visualized. There is mild calcification  of the aortic valve. Aortic valve regurgitation is not visualized.   6. The inferior vena cava is normal in size with greater than 50%  respiratory variability, suggesting right atrial pressure of 3 mmHg.   Laboratory Data: Chemistry Recent Labs  Lab 09/30/24 0858 10/05/24 1536  NA 143 139  K 4.7 3.8  CL 105 104  CO2 22 21*  GLUCOSE 103* 100*  BUN 18 19  CREATININE 1.72* 1.81*  CALCIUM  9.1 8.8*  MG  --  2.3  GFRNONAA  --  30*  ANIONGAP  --  14    Hematology Recent Labs  Lab 10/05/24 1536  WBC  8.8  RBC 4.42  HGB 12.3  HCT 40.0  MCV 90.5  MCH 27.8  MCHC 30.8  RDW 13.5  PLT 213   BNP Recent Labs  Lab 10/05/24 1536  PROBNP 432.0*   Radiology/Studies:  DG Chest Port 1 View Result Date: 10/05/2024 CLINICAL DATA:  Defibrillator shock. EXAM: PORTABLE CHEST 1 VIEW COMPARISON:  06/10/2024. FINDINGS: The heart size and mediastinal contours are within normal limits. There is atherosclerotic calcification of the aorta. No consolidation, effusion, or pneumothorax is seen. Cervical spinal fusion hardware is noted. An AICD is present over the left chest. IMPRESSION: No active disease. Electronically Signed   By: Leita Birmingham M.D.   On: 10/05/2024 15:51   Assessment and Plan: Inappropriate ICD shock Concern for lead fracture Increased RV lead impedance  Failure to capture  Oversensing  The patient had an  inappropriate ICD shock earlier this morning which is clearly evident on the ICD interrogation (EGMs pasted into HPI) with oversensing and a sudden increase in the RV lead impedance to >3000 ohms. Additionally, there is failure to capture with max output of the device (8V). This is concerning for a lead fracture. Thankfully, the patient is not pacing dependent. Her VT/VF therapies have been turned off. She did have VT/VF episodes in September. Although this was in the setting of medical non adherence, she is high risk for recurrent arrhythmias and would benefit from inpatient evaluation. In reviewing her cxr, she does seem to have a fracture in the proximal end of the RV that is not seen on the Sept. 2025 cxr. Her RV lead was implanted in January of 2011 so an extraction would be higher risk, and she would likely need CT surgical backup if deemed a candidate for extraction. - Likely needs CT lead extraction protocol after seen by EP - Monitor on telemetry - EP consult in AM  Ischemic cardiomyopathy with HFrEF  NYHA II. Euvolemic on exam. LVEF from 05/2024 35%. Adherent with GDMT. Not on ACE/ARB/ARNI or MRA due to isolated hyperkalemia of 5.7 in October of 2025. Not on SGLT2i. - Continue metoprolol  XL 100mg  daily - Consider starting ACE/ARB/ARNI, MRA, and/or SGLT2i this admission (although may be limited by CKD)  CAD s/p PCI in 2010 with BMS to LAD Coronary angiogram in 05/2024 with distal disease of Lcx and OM not amendable to disease - Continue aspirin   History of VT/VF in 05/2024 Recent admission in September of 2025 with appropriate ICD treatments of VT/VF. Improved with amiodarone  and mexiletine. Coronary angiogram without significant CAD. - Continue mexiletine 150 mg q12hrs - Continue amiodarone  (pharmacy consult for dosing, either 200mg  daily or 200 mg bid) - Continue metoprolol  as above  Hypertension - Continue amlodipine   Depression  - Continue Cymbalta   GERD - Continue pantoprazole     Chronic kidney disease  Cr on admission 1.8 which is near baseline.  Risk Assessment/Risk Scores:  [place]  Heart Association (NYHA) Functional Class NYHA Class II   Code Status: Full Code  Severity of Illness: The appropriate patient status for this patient is INPATIENT. Inpatient status is judged to be reasonable and necessary in order to provide the required intensity of service to ensure the patient's safety. The patient's presenting symptoms, physical exam findings, and initial radiographic and laboratory data in the context of their chronic comorbidities is felt to place them at high risk for further clinical deterioration. Furthermore, it is not anticipated that the patient will be medically stable for discharge from the hospital within 2 midnights of admission.   *  I certify that at the point of admission it is my clinical judgment that the patient will require inpatient hospital care spanning beyond 2 midnights from the point of admission due to high intensity of service, high risk for further deterioration and high frequency of surveillance required.*  For questions or updates, please contact [facility] Please consult [email or web address] for contact info under    Signed, Jerrell DELENA Orchard, MD  10/05/2024 7:43 PM       [1] No Known Allergies  "

## 2024-10-05 NOTE — ED Triage Notes (Addendum)
 Pt comes in after her defibrillator shocked her. Pt was washing her hands in the bathroom with NO s/s. Suddenly the pt felt her defibrillator shocked her. Pt is not sure why it did when her was not symptomatic. Pt did not call her cardiologist. A&Ox4.    Defibrillator is a paediatric nurse

## 2024-10-06 DIAGNOSIS — T82118A Breakdown (mechanical) of other cardiac electronic device, initial encounter: Secondary | ICD-10-CM

## 2024-10-06 LAB — CBC WITH DIFFERENTIAL/PLATELET
Abs Immature Granulocytes: 0.03 K/uL (ref 0.00–0.07)
Basophils Absolute: 0.1 K/uL (ref 0.0–0.1)
Basophils Relative: 1 %
Eosinophils Absolute: 0.1 K/uL (ref 0.0–0.5)
Eosinophils Relative: 1 %
HCT: 39.1 % (ref 36.0–46.0)
Hemoglobin: 12.4 g/dL (ref 12.0–15.0)
Immature Granulocytes: 0 %
Lymphocytes Relative: 29 %
Lymphs Abs: 2.8 K/uL (ref 0.7–4.0)
MCH: 28.2 pg (ref 26.0–34.0)
MCHC: 31.7 g/dL (ref 30.0–36.0)
MCV: 88.9 fL (ref 80.0–100.0)
Monocytes Absolute: 0.9 K/uL (ref 0.1–1.0)
Monocytes Relative: 10 %
Neutro Abs: 5.9 K/uL (ref 1.7–7.7)
Neutrophils Relative %: 59 %
Platelets: 210 K/uL (ref 150–400)
RBC: 4.4 MIL/uL (ref 3.87–5.11)
RDW: 13.4 % (ref 11.5–15.5)
WBC: 9.8 K/uL (ref 4.0–10.5)
nRBC: 0 % (ref 0.0–0.2)

## 2024-10-06 LAB — PROTIME-INR
INR: 1.1 (ref 0.8–1.2)
Prothrombin Time: 15.1 s (ref 11.4–15.2)

## 2024-10-06 LAB — BASIC METABOLIC PANEL WITH GFR
Anion gap: 14 (ref 5–15)
BUN: 18 mg/dL (ref 8–23)
CO2: 20 mmol/L — ABNORMAL LOW (ref 22–32)
Calcium: 8.7 mg/dL — ABNORMAL LOW (ref 8.9–10.3)
Chloride: 106 mmol/L (ref 98–111)
Creatinine, Ser: 1.85 mg/dL — ABNORMAL HIGH (ref 0.44–1.00)
GFR, Estimated: 30 mL/min — ABNORMAL LOW
Glucose, Bld: 94 mg/dL (ref 70–99)
Potassium: 3.4 mmol/L — ABNORMAL LOW (ref 3.5–5.1)
Sodium: 139 mmol/L (ref 135–145)

## 2024-10-06 LAB — APTT: aPTT: 32 s (ref 24–36)

## 2024-10-06 LAB — MAGNESIUM: Magnesium: 2.2 mg/dL (ref 1.7–2.4)

## 2024-10-06 MED ORDER — POTASSIUM CHLORIDE CRYS ER 20 MEQ PO TBCR
40.0000 meq | EXTENDED_RELEASE_TABLET | Freq: Once | ORAL | Status: AC
  Administered 2024-10-06: 40 meq via ORAL
  Filled 2024-10-06: qty 2

## 2024-10-06 NOTE — Progress Notes (Signed)
 K 3.4 today, per MD's note this am should replace, no orders placed. Provider on call notified via amion, await orders.

## 2024-10-06 NOTE — Consult Note (Signed)
 "  ELECTROPHYSIOLOGY CONSULT NOTE    Patient ID: [name] [name] MRN: [id number], DOB/AGE: September 23, 1955 69 y.o.  Admit date: 10/05/2024 Date of Consult: 10/06/2024  Primary Physician: Dorcus Lamar POUR, MD Primary Cardiologist: Dr. Wonda Electrophysiologist: Dr. Inocencio  Patient Profile: [name] [name] is a 69 y.o. female with a history of ICM with HFrEF (LVEF 35-40% 05/2024) s/p MDT ICD in 2011, CAD s/p anterior MI in 2010 s/p PCI with BMS to LAD, prior VT/VF on amiodarone  and mexiletine who is being seen 10/06/2024 at the request of Dr. Orlando for the evaluation of inappropriate ICD shock.   HPI:  [name] [name] is a 69 y.o. female with ischemic cardiopathy history of VT treated with device therapyby her [employer]onic single-chamber ICD.  She is managed with amiodarone  200 mg daily and mexiletine 150 mg twice daily for VT.  I spoke with Donley James, the [employer] rep who interrogated her device last night, by phone.  The patient has noise that is reproducible with movement.  Lead impedance is over 3000.  EGM's of the treated VF episode are scanned in Dr. Oran H&P   She denies chest pain, palpitations, dyspnea, PND, orthopnea, nausea, vomiting, dizziness, syncope, edema, or weight gain.  Past Medical History:  Diagnosis Date   AICD (automatic cardioverter/defibrillator) present    Anemia    Cervical disc disease    disabled police officer   Coronary artery disease    Depression    Dyslipidemia    Dysrhythmia    GERD (gastroesophageal reflux disease)    ICD (implantable cardiac defibrillator), single MDT    treated with BMS to LAD   Ischemic cardiomyopathy    post-MI LVEF less than 30%; acute AMI Rx w BMS>>LAD   Myocardial infarction [facility])    Uterine cancer [facility])    age 31, s/p partial hysterectomy      Home medications (Not in a hospital admission)     Physical Exam: Vitals:   10/06/24 0245 10/06/24 0500 10/06/24 0638 10/06/24 0645  BP: 106/60 (!) 103/58  (!) 112/59   Pulse: (!) 52 (!) 57  (!) 54  Resp: 15 13  12   Temp: 98.6 F (37 C)  98.3 F (36.8 C)   TempSrc: Oral  Oral   SpO2: 98% 100%  100%  Height:        Gen: Appears comfortable, well-nourished CV: RRR, no dependent edema The device site is normal -- no tenderness, edema, drainage, redness, threatened erosion.  Pulm: breathing easily  PERTINENT STUDIES SUMMARIZED:  Echocardiogram:      TTE September 2025: LVEF 35 to 40%.     EKG: Sinus rhythm heart rate 57 bpm.  Anteroseptal infarct.  (personally reviewed)  TELEMETRY:  Sinus rhythm (personally reviewed)  DEVICE HISTORY:  Medtronic single-chamber ICD   ASSESSMENT & PLAN:  Inappropriate shock Therapies turned off by [employer] rep Increased impedance, noise with movement suggest conductor fracture Anticipate she will need replacement of her ICD lead, preferably with extraction.  History of device treated VF Because her device is now secondary prevention, I would be hesitant to discharge her immediately and without some protection pending device replacement. If the leak cannot be replaced immediately, she could be discharged with a LifeVest for a planned outpatient extraction, though this is less preferable Continue amiodarone  and mexiletine  Hypokalemia Replace potassium  Ischemic cardiomyopathy with HFrEF Euvolemic, compensated, NYHA II EF 35% September 2025 Not on ACE/ARB/ARNI or MRA due to hyperkalemia Continue metoprolol  XL 100 mg daily Continue starting  SGLT2i this admission  CAD Status post bare-metal stent to LAD Appears stable, no angina  Chronic kidney disease At baseline, creatinine 1.8   For questions or updates, please contact [facility] Please consult [email or web address] for contact info under Cardiology/STEMI.  Signed, Eulas Furbish, MD 10/06/2024 8:24 AM       "

## 2024-10-07 ENCOUNTER — Telehealth: Payer: Self-pay | Admitting: Cardiovascular Disease

## 2024-10-07 DIAGNOSIS — T82118D Breakdown (mechanical) of other cardiac electronic device, subsequent encounter: Secondary | ICD-10-CM

## 2024-10-07 LAB — CBC WITH DIFFERENTIAL/PLATELET
Abs Immature Granulocytes: 0.03 K/uL (ref 0.00–0.07)
Basophils Absolute: 0.1 K/uL (ref 0.0–0.1)
Basophils Relative: 1 %
Eosinophils Absolute: 0.1 K/uL (ref 0.0–0.5)
Eosinophils Relative: 1 %
HCT: 38.2 % (ref 36.0–46.0)
Hemoglobin: 12.2 g/dL (ref 12.0–15.0)
Immature Granulocytes: 0 %
Lymphocytes Relative: 28 %
Lymphs Abs: 2.1 K/uL (ref 0.7–4.0)
MCH: 28.4 pg (ref 26.0–34.0)
MCHC: 31.9 g/dL (ref 30.0–36.0)
MCV: 88.8 fL (ref 80.0–100.0)
Monocytes Absolute: 0.8 K/uL (ref 0.1–1.0)
Monocytes Relative: 10 %
Neutro Abs: 4.6 K/uL (ref 1.7–7.7)
Neutrophils Relative %: 60 %
Platelets: 188 K/uL (ref 150–400)
RBC: 4.3 MIL/uL (ref 3.87–5.11)
RDW: 13.6 % (ref 11.5–15.5)
WBC: 7.7 K/uL (ref 4.0–10.5)
nRBC: 0 % (ref 0.0–0.2)

## 2024-10-07 LAB — BASIC METABOLIC PANEL WITH GFR
Anion gap: 11 (ref 5–15)
BUN: 21 mg/dL (ref 8–23)
CO2: 24 mmol/L (ref 22–32)
Calcium: 9.1 mg/dL (ref 8.9–10.3)
Chloride: 103 mmol/L (ref 98–111)
Creatinine, Ser: 2.07 mg/dL — ABNORMAL HIGH (ref 0.44–1.00)
GFR, Estimated: 26 mL/min — ABNORMAL LOW
Glucose, Bld: 105 mg/dL — ABNORMAL HIGH (ref 70–99)
Potassium: 4.2 mmol/L (ref 3.5–5.1)
Sodium: 137 mmol/L (ref 135–145)

## 2024-10-07 LAB — MAGNESIUM: Magnesium: 2.3 mg/dL (ref 1.7–2.4)

## 2024-10-07 NOTE — Progress Notes (Signed)
 "  Rounding Note   Patient Name: MELESA [name] Date of Encounter: 10/07/2024   [facility] Cardiologist: Ozell Fell, MD  EP: Dr. Inocencio  Subjective  Feels well  Scheduled Meds:  amiodarone   200 mg Oral Daily   amLODipine   5 mg Oral QHS   aspirin  EC  81 mg Oral Daily   atorvastatin   80 mg Oral Daily   DULoxetine   60 mg Oral Daily   heparin   5,000 Units Subcutaneous Q8H   levothyroxine   25 mcg Oral Daily   metoprolol  succinate  100 mg Oral QPM   mexiletine  150 mg Oral Q12H   pantoprazole   40 mg Oral Daily   sodium chloride  flush  3 mL Intravenous Q12H   Continuous Infusions:  PRN Meds: acetaminophen , ondansetron  (ZOFRAN ) IV, sodium chloride  flush   Vital Signs  Vitals:   10/07/24 0018 10/07/24 0523 10/07/24 0528 10/07/24 0758  BP: (!) 110/59 101/62  100/66  Pulse: 64 (!) 56  (!) 54  Resp: 18 17  16   Temp: 98.1 F (36.7 C) 97.6 F (36.4 C)  98.2 F (36.8 C)  TempSrc: Oral Axillary  Oral  SpO2: 96% 95%  95%  Weight:   65.9 kg   Height:        Intake/Output Summary (Last 24 hours) at 10/07/2024 1118 Last data filed at 10/06/2024 1801 Gross per 24 hour  Intake 480 ml  Output --  Net 480 ml      10/07/2024    5:28 AM 10/06/2024    3:00 PM 08/23/2024    2:35 PM  Last 3 Weights  Weight (lbs) 145 lb 3.2 oz 145 lb 3.2 oz 148 lb  Weight (kg) 65.862 kg 65.862 kg 67.132 kg      Telemetry SR only - Personally Reviewed  ECG  No new EKGs - Personally Reviewed  Physical Exam  GEN: No acute distress.   Neck: No JVD Cardiac: RRR, no murmurs, rubs, or gallops.  Respiratory: Clear to auscultation bilaterally. GI: Soft, nontender, non-distended  MS: No edema; No deformity. Neuro:  Nonfocal  Psych: Normal affect   ICD pocket is stable  Labs High Sensitivity Troponin:  No results for input(s): TROPONINIHS in the last 720 hours. No results for input(s): TRNPT in the last 720 hours.     Chemistry Recent Labs  Lab 10/05/24 1536 10/06/24 0326  10/07/24 0351  NA 139 139 137  K 3.8 3.4* 4.2  CL 104 106 103  CO2 21* 20* 24  GLUCOSE 100* 94 105*  BUN 19 18 21   CREATININE 1.81* 1.85* 2.07*  CALCIUM  8.8* 8.7* 9.1  MG 2.3 2.2 2.3  GFRNONAA 30* 30* 26*  ANIONGAP 14 14 11     Lipids No results for input(s): CHOL, TRIG, HDL, LABVLDL, LDLCALC, CHOLHDL in the last 168 hours.  Hematology Recent Labs  Lab 10/05/24 1536 10/06/24 0326 10/07/24 0351  WBC 8.8 9.8 7.7  RBC 4.42 4.40 4.30  HGB 12.3 12.4 12.2  HCT 40.0 39.1 38.2  MCV 90.5 88.9 88.8  MCH 27.8 28.2 28.4  MCHC 30.8 31.7 31.9  RDW 13.5 13.4 13.6  PLT 213 210 188   Thyroid  No results for input(s): TSH, FREET4 in the last 168 hours.  BNP Recent Labs  Lab 10/05/24 1536  PROBNP 432.0*    DDimer No results for input(s): DDIMER in the last 168 hours.   Radiology  DG Chest Port 1 View Result Date: 10/05/2024 CLINICAL DATA:  Defibrillator shock. EXAM: PORTABLE CHEST 1  VIEW COMPARISON:  06/10/2024. FINDINGS: The heart size and mediastinal contours are within normal limits. There is atherosclerotic calcification of the aorta. No consolidation, effusion, or pneumothorax is seen. Cervical spinal fusion hardware is noted. An AICD is present over the left chest. IMPRESSION: No active disease. Electronically Signed   By: Leita Birmingham M.D.   On: 10/05/2024 15:51    Cardiac Studies  06/11/24: TTE 1. Left ventricular ejection fraction, by estimation, is 35 to 40%. The  left ventricle has moderately decreased function. The left ventricle  demonstrates regional wall motion abnormalities (see scoring  diagram/findings for description). Left ventricular   diastolic parameters were normal.   2. No formed LV mural thrombus evident by Definity  contrast. Transverse  false tendon at apex.   3. Right ventricular systolic function is normal. The right ventricular  size is normal. Tricuspid regurgitation signal is inadequate for assessing  PA pressure.   4. The mitral  valve is degenerative. Trivial mitral valve regurgitation.   5. The aortic valve was not well visualized. There is mild calcification  of the aortic valve. Aortic valve regurgitation is not visualized.   6. The inferior vena cava is normal in size with greater than 50%  respiratory variability, suggesting right atrial pressure of 3 mmHg.   Comparison(s): Prior images reviewed side by side. LVEF 35-40% with wall  motion abnormalties consistent with ischemic cardiomyopathy.    Coronary angiography 06/12/2024: LM: Normal LAD: Patent prox-mid stent with only 20-30% late lumen loss Lcx: Large dominant vessel with mild OM2, mid OM disease        Severe diffuse disease in small caliber distal Lcx/OM branches RCA: Small nondominant vessel with mild diffuse disease LVEDP 26 mmHg Conclusion: No severe proximal epicardial disease Diffuse disease in distal Lcx/OM branches not amenable to PCI and unlikely to be the cause of low EF  Continue GMDT for HFrEF and polymorphic VT     Patient Profile   69 y.o. female w/PMHx of CKD (IIIb), GERD, GIB Aug 2021 (2/2 polypoid leasion underwent partial colectomy), HLD CAD (anterior MI in 2010 tx with PCI to LAD),  ICM, ICD,  SVT VT   Device information MDT single chamber ICD implanted 11/17/2017 H/o inappropriate tx in 2017 for SVT Hx of appropriate shocks for PMVT Sept 2025 >> Loaded on IV amio and underwent cath without intervention as below. Started on mexitil  with close coupled PVCs   Admitted with inappropriate shock for noise on her ICD lead   Assessment & Plan   Inappropriate ICD shock for noise on RV lead Lead fracture Acute change/rise in RV lead impedance >3,000 Ohms All tachy therapies have been programmed off  Suspect given young age, will look to a ICD system extraction and new implant She has a 6935 single coil RV lead implanted 09/28/2009 She is willing to stay in-patient until new device/lead system is addressed.  2.  CAD No  symptoms Cath last year as above Home meds  3. VT (PMVT Chronic amiodarone /mexiletine Continue No arrhythmias here  4.  CKD Follow Creat seems to wobble looks about her recent baseline  5.  ICM Volume stable   [facility] Please consult [email or web address] for contact info under    Signed, Charlies Macario Arthur, PA-C  10/07/2024, 11:18 AM    "

## 2024-10-07 NOTE — Telephone Encounter (Signed)
 Spoke w/ patient regarding c/o ICD going bad/shocking her. Patient is currently in the hospital at [facility] and called clinic to ask what the situation is regarding her ICD. Informed patient her RV lead has a possible fracture given elevated impedance result. Advised patient the EP Cardiology team will be taking care of her in the hospital and provide the best possible care. Verbalized understanding.   Will continue to monitor and update accordingly.

## 2024-10-07 NOTE — Telephone Encounter (Signed)
 Pt currently hospitalized, states device is going bad/shocking her sand she would like a c/b regarding this matter.

## 2024-10-08 ENCOUNTER — Ambulatory Visit: Admitting: Student

## 2024-10-08 LAB — CBC WITH DIFFERENTIAL/PLATELET
Abs Immature Granulocytes: 0.03 K/uL (ref 0.00–0.07)
Basophils Absolute: 0.1 K/uL (ref 0.0–0.1)
Basophils Relative: 1 %
Eosinophils Absolute: 0.2 K/uL (ref 0.0–0.5)
Eosinophils Relative: 2 %
HCT: 40.5 % (ref 36.0–46.0)
Hemoglobin: 12.9 g/dL (ref 12.0–15.0)
Immature Granulocytes: 0 %
Lymphocytes Relative: 30 %
Lymphs Abs: 2.6 K/uL (ref 0.7–4.0)
MCH: 28.1 pg (ref 26.0–34.0)
MCHC: 31.9 g/dL (ref 30.0–36.0)
MCV: 88.2 fL (ref 80.0–100.0)
Monocytes Absolute: 0.9 K/uL (ref 0.1–1.0)
Monocytes Relative: 11 %
Neutro Abs: 4.9 K/uL (ref 1.7–7.7)
Neutrophils Relative %: 56 %
Platelets: 206 K/uL (ref 150–400)
RBC: 4.59 MIL/uL (ref 3.87–5.11)
RDW: 13.7 % (ref 11.5–15.5)
WBC: 8.7 K/uL (ref 4.0–10.5)
nRBC: 0 % (ref 0.0–0.2)

## 2024-10-08 LAB — BASIC METABOLIC PANEL WITH GFR
Anion gap: 12 (ref 5–15)
BUN: 25 mg/dL — ABNORMAL HIGH (ref 8–23)
CO2: 25 mmol/L (ref 22–32)
Calcium: 9.6 mg/dL (ref 8.9–10.3)
Chloride: 102 mmol/L (ref 98–111)
Creatinine, Ser: 2.06 mg/dL — ABNORMAL HIGH (ref 0.44–1.00)
GFR, Estimated: 26 mL/min — ABNORMAL LOW
Glucose, Bld: 99 mg/dL (ref 70–99)
Potassium: 4.3 mmol/L (ref 3.5–5.1)
Sodium: 139 mmol/L (ref 135–145)

## 2024-10-08 LAB — MAGNESIUM: Magnesium: 2.2 mg/dL (ref 1.7–2.4)

## 2024-10-08 NOTE — Progress Notes (Signed)
 "  Rounding Note   Patient Name: [name] [name] Date of Encounter: 10/08/2024  [facility] Cardiologist: Ozell Fell, MD  EP: Dr. Inocencio  Subjective  Feels well, no new complaints or concerns  Scheduled Meds:  amiodarone   200 mg Oral Daily   amLODipine   5 mg Oral QHS   aspirin  EC  81 mg Oral Daily   atorvastatin   80 mg Oral Daily   DULoxetine   60 mg Oral Daily   heparin   5,000 Units Subcutaneous Q8H   levothyroxine   25 mcg Oral Daily   metoprolol  succinate  100 mg Oral QPM   mexiletine  150 mg Oral Q12H   pantoprazole   40 mg Oral Daily   sodium chloride  flush  3 mL Intravenous Q12H   Continuous Infusions:  PRN Meds: acetaminophen , ondansetron  (ZOFRAN ) IV, sodium chloride  flush   Vital Signs  Vitals:   10/07/24 2358 10/08/24 0535 10/08/24 0540 10/08/24 0823  BP: (!) 109/56 (!) 92/57 102/62 99/65  Pulse: (!) 55   (!) 53  Resp: 17 16  16   Temp: 98.3 F (36.8 C) 98.1 F (36.7 C)  97.8 F (36.6 C)  TempSrc: Oral Oral  Oral  SpO2: 97% 98%  95%  Weight:      Height:        Intake/Output Summary (Last 24 hours) at 10/08/2024 0920 Last data filed at 10/07/2024 2100 Gross per 24 hour  Intake 360 ml  Output --  Net 360 ml      10/07/2024    5:28 AM 10/06/2024    3:00 PM 08/23/2024    2:35 PM  Last 3 Weights  Weight (lbs) 145 lb 3.2 oz 145 lb 3.2 oz 148 lb  Weight (kg) 65.862 kg 65.862 kg 67.132 kg      Telemetry SR only - Personally Reviewed  ECG  No new EKGs - Personally Reviewed  Physical Exam  Exam is unchanged GEN: No acute distress.   Neck: No JVD Cardiac: RRR, no murmurs, rubs, or gallops.  Respiratory: Clear to auscultation bilaterally. GI: Soft, nontender, non-distended  MS: No edema; No deformity. Neuro:  Nonfocal  Psych: Normal affect   ICD pocket is stable  Labs High Sensitivity Troponin:  No results for input(s): TROPONINIHS in the last 720 hours. No results for input(s): TRNPT in the last 720 hours.      Chemistry Recent Labs  Lab 10/06/24 0326 10/07/24 0351 10/08/24 0426  NA 139 137 139  K 3.4* 4.2 4.3  CL 106 103 102  CO2 20* 24 25  GLUCOSE 94 105* 99  BUN 18 21 25*  CREATININE 1.85* 2.07* 2.06*  CALCIUM  8.7* 9.1 9.6  MG 2.2 2.3 2.2  GFRNONAA 30* 26* 26*  ANIONGAP 14 11 12     Lipids No results for input(s): CHOL, TRIG, HDL, LABVLDL, LDLCALC, CHOLHDL in the last 168 hours.  Hematology Recent Labs  Lab 10/06/24 0326 10/07/24 0351 10/08/24 0426  WBC 9.8 7.7 8.7  RBC 4.40 4.30 4.59  HGB 12.4 12.2 12.9  HCT 39.1 38.2 40.5  MCV 88.9 88.8 88.2  MCH 28.2 28.4 28.1  MCHC 31.7 31.9 31.9  RDW 13.4 13.6 13.7  PLT 210 188 206   Thyroid  No results for input(s): TSH, FREET4 in the last 168 hours.  BNP Recent Labs  Lab 10/05/24 1536  PROBNP 432.0*    DDimer No results for input(s): DDIMER in the last 168 hours.   Radiology  No results found.   Cardiac Studies  06/11/24:  TTE 1. Left ventricular ejection fraction, by estimation, is 35 to 40%. The  left ventricle has moderately decreased function. The left ventricle  demonstrates regional wall motion abnormalities (see scoring  diagram/findings for description). Left ventricular   diastolic parameters were normal.   2. No formed LV mural thrombus evident by Definity  contrast. Transverse  false tendon at apex.   3. Right ventricular systolic function is normal. The right ventricular  size is normal. Tricuspid regurgitation signal is inadequate for assessing  PA pressure.   4. The mitral valve is degenerative. Trivial mitral valve regurgitation.   5. The aortic valve was not well visualized. There is mild calcification  of the aortic valve. Aortic valve regurgitation is not visualized.   6. The inferior vena cava is normal in size with greater than 50%  respiratory variability, suggesting right atrial pressure of 3 mmHg.   Comparison(s): Prior images reviewed side by side. LVEF 35-40% with wall   motion abnormalties consistent with ischemic cardiomyopathy.    Coronary angiography 06/12/2024: LM: Normal LAD: Patent prox-mid stent with only 20-30% late lumen loss Lcx: Large dominant vessel with mild OM2, mid OM disease        Severe diffuse disease in small caliber distal Lcx/OM branches RCA: Small nondominant vessel with mild diffuse disease LVEDP 26 mmHg Conclusion: No severe proximal epicardial disease Diffuse disease in distal Lcx/OM branches not amenable to PCI and unlikely to be the cause of low EF  Continue GMDT for HFrEF and polymorphic VT     Patient Profile   69 y.o. female w/PMHx of CKD (IIIb), GERD, GIB Aug 2021 (2/2 polypoid leasion underwent partial colectomy), HLD CAD (anterior MI in 2010 tx with PCI to LAD),  ICM, ICD,  SVT VT   Device information MDT single chamber ICD implanted 11/17/2017 H/o inappropriate tx in 2017 for SVT Hx of appropriate shocks for PMVT Sept 2025 >> Loaded on IV amio and underwent cath without intervention as below. Started on mexitil  with close coupled PVCs   Admitted with inappropriate shock for noise on her ICD lead   Assessment & Plan   Inappropriate ICD shock for noise on RV lead Lead fracture Acute change/rise in RV lead impedance >3,000 Ohms All tachy therapies have been programmed off  Suspect given young age, will look to a ICD system extraction and new implant She has a 6935 single coil RV lead implanted 09/28/2009 She is willing to stay in-patient until new device/lead system is addressed.  Planning for ICD system extraction with new implant Thursday afternoon Dr. Shyrl back up  Patient is aware  2.  CAD No symptoms Cath last year as above Home meds  3. VT (PMVT Chronic amiodarone /mexiletine Continue No arrhythmias here  4.  CKD Follow Creat seems to wobble looks about her recent baseline No ACE/ARB, diuretics...  5.  ICM Volume stable   [facility] Please consult [email or web address]  for contact info under    Signed, Charlies Macario Arthur, PA-C  10/08/2024, 9:20 AM    "

## 2024-10-08 NOTE — Consult Note (Signed)
 "     [address]       [place] [address]             [phone]        [name] [name] [facility] Medical Record #[id number] Date of Birth: 1955-10-14  Referring: No ref. provider found Primary Care: Beam, Lamar POUR, MD Primary Cardiologist:Michael Wonda, MD  Chief Complaint:    Chief Complaint  Patient presents with   Pacemaker Problem    History of Present Illness:    We are asked to see this patient to see if a candidate to serve as CT surgical backup for planned ICD system extraction and new implant.  The EP team has evaluated this patient and is noted to have a RV lead fracture which has led to inappropriate ICD shock for noise.  She has a history of V. tach on chronic amiodarone /mexiletine.  She underwent cardiac catheterization on August 12, 2024 which showed patent LAD proximal-mid stent with only 20-30% late luminal loss she is also noted to have severe diffuse disease in the small caliber distal left circumflex/OM branches and small nondominant RCA with mild diffuse disease.  These findings at that time were not amenable to PCI and felt to be unlikely to be the cause of low EF.  She has had no previous cardiac or lung surgery.    Current Activity/ Functional Status: Patient is independent with mobility/ambulation, transfers, ADL's, IADL's.   Zubrod Score: At the time of surgery this patients most appropriate activity status/level should be described as: []     0    Normal activity, no symptoms [x]     1    Restricted in physical strenuous activity but ambulatory, able to do out light work []     2    Ambulatory and capable of self care, unable to do work activities, up and about                 more than 50%  Of the time                            []     3    Only limited self care, in bed greater than 50% of waking hours []     4    Completely disabled, no self care, confined to bed or chair []     5    Moribund  Past Medical History:  Diagnosis Date    AICD (automatic cardioverter/defibrillator) present    Anemia    Cervical disc disease    disabled police officer   Coronary artery disease    Depression    Dyslipidemia    Dysrhythmia    GERD (gastroesophageal reflux disease)    ICD (implantable cardiac defibrillator), single MDT    treated with BMS to LAD   Ischemic cardiomyopathy    post-MI LVEF less than 30%; acute AMI Rx w BMS>>LAD   Myocardial infarction [facility])    Uterine cancer [facility])    age 69, s/p partial hysterectomy    Past Surgical History:  Procedure Laterality Date   ABDOMINAL HYSTERECTOMY     BIOPSY  04/24/2020   Procedure: BIOPSY;  Surgeon: Dianna Specking, MD;  Location: [facility] ENDOSCOPY;  Service: Endoscopy;;   CERVICAL DISC SURGERY     COLONOSCOPY WITH PROPOFOL  N/A 04/24/2020   Procedure: COLONOSCOPY WITH PROPOFOL ;  Surgeon: Dianna Specking, MD;  Location: [facility] ENDOSCOPY;  Service: Endoscopy;  Laterality: N/A;  CORONARY ANGIOPLASTY WITH STENT PLACEMENT     ESOPHAGOGASTRODUODENOSCOPY (EGD) WITH PROPOFOL  N/A 04/24/2020   Procedure: ESOPHAGOGASTRODUODENOSCOPY (EGD) WITH PROPOFOL ;  Surgeon: Dianna Specking, MD;  Location: [facility] ENDOSCOPY;  Service: Endoscopy;  Laterality: N/A;   HOT HEMOSTASIS N/A 04/24/2020   Procedure: HOT HEMOSTASIS (ARGON PLASMA COAGULATION/BICAP);  Surgeon: Dianna Specking, MD;  Location: [facility] ENDOSCOPY;  Service: Endoscopy;  Laterality: N/A;   ICD GENERATOR CHANGEOUT N/A 11/17/2017   Procedure: ICD GENERATOR CHANGEOUT;  Surgeon: Fernande Elspeth BROCKS, MD;  Location: [facility] INVASIVE CV LAB;  Service: Cardiovascular;  Laterality: N/A;   icd implanted     LEFT HEART CATH AND CORONARY ANGIOGRAPHY N/A 06/12/2024   Procedure: LEFT HEART CATH AND CORONARY ANGIOGRAPHY;  Surgeon: Elmira Newman PARAS, MD;  Location: [facility] INVASIVE CV LAB;  Service: Cardiovascular;  Laterality: N/A;   MASTECTOMY, PARTIAL Right    PARTIAL HYSTERECTOMY     POLYPECTOMY  04/24/2020   Procedure: POLYPECTOMY;  Surgeon: Dianna Specking, MD;  Location:  [facility] ENDOSCOPY;  Service: Endoscopy;;   SUBMUCOSAL TATTOO INJECTION  04/24/2020   Procedure: SUBMUCOSAL TATTOO INJECTION;  Surgeon: Dianna Specking, MD;  Location: [facility] ENDOSCOPY;  Service: Endoscopy;;    Tobacco Use History[1]  Social History   Substance and Sexual Activity  Alcohol Use Yes   Comment: rare     Allergies[2]  Current Facility-Administered Medications  Medication Dose Route Frequency Provider Last Rate Last Admin   acetaminophen  (TYLENOL ) tablet 650 mg  650 mg Oral Q4H PRN Orlando Specking LABOR, MD       amiodarone  (PACERONE ) tablet 200 mg  200 mg Oral Daily Orlando Specking LABOR, MD   200 mg at 10/08/24 9061   amLODipine  (NORVASC ) tablet 5 mg  5 mg Oral QHS Orlando Specking LABOR, MD   5 mg at 10/07/24 2113   aspirin  EC tablet 81 mg  81 mg Oral Daily Orlando Specking LABOR, MD   81 mg at 10/08/24 9060   atorvastatin  (LIPITOR) tablet 80 mg  80 mg Oral Daily Orlando Specking LABOR, MD   80 mg at 10/08/24 9060   DULoxetine  (CYMBALTA ) DR capsule 60 mg  60 mg Oral Daily Orlando Specking LABOR, MD   60 mg at 10/08/24 9061   heparin  injection 5,000 Units  5,000 Units Subcutaneous Q8H Orlando Specking LABOR, MD   5,000 Units at 10/08/24 0536   levothyroxine  (SYNTHROID ) tablet 25 mcg  25 mcg Oral Daily Orlando Specking LABOR, MD   25 mcg at 10/08/24 9462   metoprolol  succinate (TOPROL -XL) 24 hr tablet 100 mg  100 mg Oral QPM Orlando Specking LABOR, MD   100 mg at 10/07/24 1805   mexiletine (MEXITIL ) capsule 150 mg  150 mg Oral Q12H Orlando Specking LABOR, MD   150 mg at 10/08/24 9057   ondansetron  (ZOFRAN ) injection 4 mg  4 mg Intravenous Q6H PRN Orlando Specking LABOR, MD       pantoprazole  (PROTONIX ) EC tablet 40 mg  40 mg Oral Daily Orlando Specking LABOR, MD   40 mg at 10/08/24 9061   sodium chloride  flush (NS) 0.9 % injection 3 mL  3 mL Intravenous Q12H Orlando Specking LABOR, MD   3 mL at 10/08/24 9060   sodium chloride  flush (NS) 0.9 % injection 3 mL  3 mL Intravenous PRN Orlando Specking LABOR, MD        Medications Prior to  Admission  Medication Sig Dispense Refill Last Dose/Taking   amiodarone  (PACERONE ) 200 MG tablet Take 1 tablet (200 mg total) by mouth 2 (two)  times daily. 180 tablet 3 10/05/2024 Morning   amLODipine  (NORVASC ) 5 MG tablet Take 1 tablet (5 mg total) by mouth at bedtime. 90 tablet 3 10/04/2024 Bedtime   aspirin  EC 81 MG tablet Take 1 tablet (81 mg total) by mouth daily. 30 tablet 11 10/05/2024 Morning   atorvastatin  (LIPITOR) 80 MG tablet Take 1 tablet (80 mg total) by mouth daily. 90 tablet 3 10/05/2024 Morning   DULoxetine  (CYMBALTA ) 30 MG capsule Take 90 mg by mouth daily.  3 10/05/2024 Morning   levothyroxine  (SYNTHROID ) 25 MCG tablet Take 25 mcg by mouth daily.   10/05/2024 Morning   metoprolol  succinate (TOPROL -XL) 100 MG 24 hr tablet Take 1 tablet (100 mg total) by mouth every evening. Take with or immediately following a meal. 90 tablet 3 10/05/2024 Morning   mexiletine (MEXITIL ) 150 MG capsule Take 1 capsule (150 mg total) by mouth every 12 (twelve) hours. 180 capsule 3 10/05/2024 Morning   pantoprazole  (PROTONIX ) 40 MG tablet Take 1 tablet (40 mg total) by mouth daily. 90 tablet 3 10/05/2024 Morning   acetaminophen  (TYLENOL ) 325 MG tablet Take 650 mg by mouth every 6 (six) hours as needed for mild pain or moderate pain. (Patient not taking: Reported on 10/05/2024)   Not Taking   nitroGLYCERIN  (NITROSTAT ) 0.4 MG SL tablet Place 1 tablet (0.4 mg total) under the tongue every 5 (five) minutes x 3 doses as needed for chest pain. (Patient not taking: Reported on 10/05/2024) 30 tablet 0 Not Taking    Family History  Problem Relation Age of Onset   Heart attack Mother    Cancer Neg Hx      Review of Systems:   Review of Systems  All other systems reviewed and are negative.         Physical Exam: BP 99/65 (BP Location: Right Arm)   Pulse (!) 53   Temp 97.8 F (36.6 C) (Oral)   Resp 16   Ht 5' 4 (1.626 m)   Wt 65.9 kg   SpO2 95%   BMI 24.92 kg/m    General appearance: alert,  cooperative, and no distress Head: Normocephalic, without obvious abnormality, atraumatic Neck: no adenopathy, no carotid bruit, no JVD, supple, symmetrical, trachea midline, and thyroid  not enlarged, symmetric, no tenderness/mass/nodules Lymph nodes: Cervical, supraclavicular, and axillary nodes normal. Resp: clear to auscultation bilaterally Back: negative, symmetric, no curvature. ROM normal. No CVA tenderness. Cardio: regular rate and rhythm, S1, S2 normal, no murmur, click, rub or gallop GI: soft, non-tender; bowel sounds normal; no masses,  no organomegaly Extremities: no edema  Neurologic: Grossly normal  Diagnostic Studies & Laboratory data:     Recent Radiology Findings:   No results found.   I have independently reviewed the above radiologic studies and discussed with the patient   Recent Lab Findings: Lab Results  Component Value Date   WBC 8.7 10/08/2024   HGB 12.9 10/08/2024   HCT 40.5 10/08/2024   PLT 206 10/08/2024   GLUCOSE 99 10/08/2024   CHOL 111 08/26/2022   TRIG 177 (H) 08/26/2022   HDL 34 (L) 08/26/2022   LDLDIRECT 57 12/02/2019   LDLCALC 48 08/26/2022   ALT 43 06/10/2024   AST 38 06/10/2024   NA 139 10/08/2024   K 4.3 10/08/2024   CL 102 10/08/2024   CREATININE 2.06 (H) 10/08/2024   BUN 25 (H) 10/08/2024   CO2 25 10/08/2024   TSH 5.670 (H) 07/02/2024   INR 1.1 10/06/2024   HGBA1C 5.5 06/14/2024  Assessment / Plan: Inappropriate ICD shock with RV lead fracture requiring replacement per EP.  Patient appears to be a candidate for CT surgery backup of proposed procedure.        I  spent 20 minutes counseling the patient face to face.   Lemond FORBES Cera, PA-C  10/08/2024 10:21 AM         [1]  Social History Tobacco Use  Smoking Status Former   Current packs/day: 0.00   Average packs/day: 0.5 packs/day for 20.0 years (10.0 ttl pk-yrs)   Types: Cigarettes   Start date: 53   Quit date: 2019   Years since quitting: 7.0   Smokeless Tobacco Never  [2] No Known Allergies  "

## 2024-10-08 NOTE — TOC Initial Note (Signed)
 Transition of Care [facility]) - Initial/Assessment Note    Patient Details  Name: [name] [name] MRN: [id number] Date of Birth: 02-08-1956  Transition of Care [facility]) CM/SW Contact:    Sudie Erminio Deems, RN Phone Number: 10/08/2024, 1:03 PM  Clinical Narrative: Patient presented for ICD shock-EP is following. Per MD notes-plan for ICD system extraction with new implant 10-10-24. PTA patent was independent from home alone. Patient has PCP and states she drives herself to all appointments. Patient does not use any DME in the home and she is not currently active with any [facility]. ICM will continue to follow for additional needs as the patient progresses.       Expected Discharge Plan: Home/Self Care Barriers to Discharge: Continued Medical Work up   Patient Goals and CMS Choice Patient states their goals for this hospitalization and ongoing recovery are:: Plans to return home once stable.  Expected Discharge Plan and Services   Discharge Planning Services: CM Consult Post Acute Care Choice: NA Living arrangements for the past 2 months: Mobile Home                   DME Agency: NA       HH Arranged: NA   Prior Living Arrangements/Services Living arrangements for the past 2 months: Mobile Home Lives with:: Self Patient language and need for interpreter reviewed:: Yes Do you feel safe going back to the place where you live?: Yes      Need for Family Participation in Patient Care: No (Comment) Care giver support system in place?: No (comment)   Criminal Activity/Legal Involvement Pertinent to Current Situation/Hospitalization: No - Comment as needed  Activities of Daily Living   ADL Screening (condition at time of admission) Independently performs ADLs?: Yes (appropriate for developmental age) Is the patient deaf or have difficulty hearing?: No Does the patient have difficulty seeing, even when wearing glasses/contacts?: No Does the patient have difficulty concentrating,  remembering, or making decisions?: No  Permission Sought/Granted Permission sought to share information with : Case Manager               Emotional Assessment Appearance:: Appears stated age Attitude/Demeanor/Rapport: Engaged Affect (typically observed): Appropriate Orientation: : Oriented to Self, Oriented to Place, Oriented to  Time, Oriented to Situation Alcohol / Substance Use: Not Applicable Psych Involvement: No (comment)  Admission diagnosis:  ICD (implantable cardioverter-defibrillator) malfunction [T82.118A] Inappropriate discharge of implantable cardioverter-defibrillator (ICD), initial encounter [T82.198A] Patient Active Problem List   Diagnosis Date Noted   ICD (implantable cardioverter-defibrillator) malfunction 10/05/2024   Stage 3b chronic kidney disease (HCC) 11/02/2021   Adenomatous colon mass s/p robotic proximal colectomy 05/29/2020 05/29/2020   Preoperative clearance    Anemia 04/22/2020   Polymorphic ventricular tachycardia (HCC) 12/01/2019   SVT (supraventricular tachycardia) 11/13/2012   Dual implantable cardioverter-defibrillator in situ    Ischemic cardiomyopathy    HFrEF (heart failure with reduced ejection fraction) (HCC) 10/19/2010   FATIGUE 10/19/2010   CAD (coronary artery disease) 01/11/2010   Hyperlipidemia with target low density lipoprotein (LDL) cholesterol less than 70 mg/dL 88/82/7989   DEPRESSION 08/05/2009   GASTROESOPHAGEAL REFLUX DISEASE 08/05/2009   DISC DISEASE, CERVICAL 08/05/2009   PCP:  Beam, Lamar POUR, MD Pharmacy:   [facility] [phone] - [place], [place] - [address] AT SEC [address] [place] [address] Phone: [phone] Fax: [phone]     Social Drivers of Health (SDOH) Social History: SDOH  Screenings   Food Insecurity: No Food Insecurity (10/06/2024)  Housing: Low Risk (10/06/2024)  Transportation Needs: No Transportation Needs (10/06/2024)  Utilities: Not At  Risk (10/06/2024)  Financial Resource Strain: Low Risk (06/21/2024)   Received from [facility]  Physical Activity: Inactive (06/21/2024)   Received from [facility]  Social Connections: Socially Isolated (10/06/2024)  Stress: Stress Concern Present (06/21/2024)   Received from [facility]  Tobacco Use: Medium Risk (10/05/2024)   SDOH Interventions: Social Connections Interventions: Intervention Not Indicated, Inpatient TOC   Readmission Risk Interventions     No data to display

## 2024-10-09 LAB — CBC WITH DIFFERENTIAL/PLATELET
Abs Immature Granulocytes: 0.04 K/uL (ref 0.00–0.07)
Basophils Absolute: 0.1 K/uL (ref 0.0–0.1)
Basophils Relative: 1 %
Eosinophils Absolute: 0.2 K/uL (ref 0.0–0.5)
Eosinophils Relative: 2 %
HCT: 42.9 % (ref 36.0–46.0)
Hemoglobin: 13.6 g/dL (ref 12.0–15.0)
Immature Granulocytes: 1 %
Lymphocytes Relative: 26 %
Lymphs Abs: 2.3 K/uL (ref 0.7–4.0)
MCH: 28.2 pg (ref 26.0–34.0)
MCHC: 31.7 g/dL (ref 30.0–36.0)
MCV: 88.8 fL (ref 80.0–100.0)
Monocytes Absolute: 0.9 K/uL (ref 0.1–1.0)
Monocytes Relative: 10 %
Neutro Abs: 5.3 K/uL (ref 1.7–7.7)
Neutrophils Relative %: 60 %
Platelets: 213 K/uL (ref 150–400)
RBC: 4.83 MIL/uL (ref 3.87–5.11)
RDW: 13.4 % (ref 11.5–15.5)
WBC: 8.7 K/uL (ref 4.0–10.5)
nRBC: 0 % (ref 0.0–0.2)

## 2024-10-09 LAB — BASIC METABOLIC PANEL WITH GFR
Anion gap: 12 (ref 5–15)
BUN: 27 mg/dL — ABNORMAL HIGH (ref 8–23)
CO2: 26 mmol/L (ref 22–32)
Calcium: 9.7 mg/dL (ref 8.9–10.3)
Chloride: 100 mmol/L (ref 98–111)
Creatinine, Ser: 2.07 mg/dL — ABNORMAL HIGH (ref 0.44–1.00)
GFR, Estimated: 26 mL/min — ABNORMAL LOW
Glucose, Bld: 100 mg/dL — ABNORMAL HIGH (ref 70–99)
Potassium: 4.3 mmol/L (ref 3.5–5.1)
Sodium: 138 mmol/L (ref 135–145)

## 2024-10-09 LAB — PREPARE RBC (CROSSMATCH)

## 2024-10-09 LAB — SURGICAL PCR SCREEN
MRSA, PCR: NEGATIVE
Staphylococcus aureus: NEGATIVE

## 2024-10-09 LAB — MAGNESIUM: Magnesium: 2.3 mg/dL (ref 1.7–2.4)

## 2024-10-09 MED ORDER — CHLORHEXIDINE GLUCONATE 4 % EX SOLN
60.0000 mL | Freq: Once | CUTANEOUS | Status: AC
  Administered 2024-10-09: 4 via TOPICAL
  Filled 2024-10-09 (×2): qty 60

## 2024-10-09 MED ORDER — SODIUM CHLORIDE 0.9% FLUSH: 3.0000 mL | INTRAVENOUS | Status: DC | PRN

## 2024-10-09 MED ORDER — SODIUM CHLORIDE 0.9 % IV SOLN
80.0000 mg | INTRAVENOUS | Status: AC
  Administered 2024-10-10: 80 mg
  Filled 2024-10-09: qty 2

## 2024-10-09 MED ORDER — CHLORHEXIDINE GLUCONATE 4 % EX SOLN
60.0000 mL | Freq: Once | CUTANEOUS | Status: AC
  Administered 2024-10-10: 4 via TOPICAL
  Filled 2024-10-09: qty 60

## 2024-10-09 MED ORDER — SODIUM CHLORIDE 0.9% FLUSH
3.0000 mL | Freq: Two times a day (BID) | INTRAVENOUS | Status: DC
  Administered 2024-10-09 – 2024-10-11 (×5): 3 mL via INTRAVENOUS

## 2024-10-09 MED ORDER — CEFAZOLIN SODIUM-DEXTROSE 2-4 GM/100ML-% IV SOLN
2.0000 g | INTRAVENOUS | Status: AC
  Administered 2024-10-10: 2 g via INTRAVENOUS
  Filled 2024-10-09 (×2): qty 100

## 2024-10-09 MED ORDER — SODIUM CHLORIDE 0.9 % IV SOLN: INTRAVENOUS | Status: AC

## 2024-10-09 NOTE — Progress Notes (Signed)
 Attempted to contact the patient to discuss upcoming appointments and recommended health maintenance screenings for the year.  A Care Connections Specialist has reviewed the patient's chart to identify any gaps in care and outstanding preventive health needs. However, we were unable to reach the patient to review or schedule the necessary follow-up appointments and screenings.  Left message: yes   MyChart message sent: yes   Voicemail was left including callback details and our hours of operation.  Comments:

## 2024-10-09 NOTE — Progress Notes (Cosign Needed Addendum)
 "  Rounding Note   Patient Name: [name] [name] Date of Encounter: 10/09/2024  [facility] Cardiologist: Ozell Fell, MD  EP: Dr. Inocencio  Subjective  Feels well, no new complaints or concerns  Scheduled Meds:  amiodarone   200 mg Oral Daily   amLODipine   5 mg Oral QHS   aspirin  EC  81 mg Oral Daily   atorvastatin   80 mg Oral Daily   DULoxetine   60 mg Oral Daily   levothyroxine   25 mcg Oral Daily   metoprolol  succinate  100 mg Oral QPM   mexiletine  150 mg Oral Q12H   pantoprazole   40 mg Oral Daily   sodium chloride  flush  3 mL Intravenous Q12H   Continuous Infusions:  PRN Meds: acetaminophen , ondansetron  (ZOFRAN ) IV, sodium chloride  flush   Vital Signs  Vitals:   10/08/24 1940 10/09/24 0031 10/09/24 0508 10/09/24 0831  BP: (!) 110/57 109/69 110/64 102/65  Pulse: (!) 57 (!) 57 (!) 50 (!) 52  Resp:  16 18 16   Temp: 98.2 F (36.8 C) 98.1 F (36.7 C) (!) 97.5 F (36.4 C) 98.1 F (36.7 C)  TempSrc: Oral Oral Oral Oral  SpO2: 96% 97% 95% 95%  Weight:   65 kg   Height:        Intake/Output Summary (Last 24 hours) at 10/09/2024 0848 Last data filed at 10/09/2024 0510 Gross per 24 hour  Intake 480 ml  Output --  Net 480 ml      10/09/2024    5:08 AM 10/07/2024    5:28 AM 10/06/2024    3:00 PM  Last 3 Weights  Weight (lbs) 143 lb 3.2 oz 145 lb 3.2 oz 145 lb 3.2 oz  Weight (kg) 64.955 kg 65.862 kg 65.862 kg      Telemetry SR/SB, 509's-60's - Personally Reviewed  ECG  No new EKGs - Personally Reviewed  Physical Exam  Exam is unchanged, seen and examined this morning by Dr. Almetta GEN: No acute distress.   Neck: No JVD Cardiac: RRR, no murmurs, rubs, or gallops.  Respiratory: Clear to auscultation bilaterally. GI: Soft, nontender, non-distended  MS: No edema; No deformity. Neuro:  Nonfocal  Psych: Normal affect   ICD pocket is stable  Labs High Sensitivity Troponin:  No results for input(s): TROPONINIHS in the last 720 hours. No results  for input(s): TRNPT in the last 720 hours.     Chemistry Recent Labs  Lab 10/07/24 0351 10/08/24 0426 10/09/24 0456  NA 137 139 138  K 4.2 4.3 4.3  CL 103 102 100  CO2 24 25 26   GLUCOSE 105* 99 100*  BUN 21 25* 27*  CREATININE 2.07* 2.06* 2.07*  CALCIUM  9.1 9.6 9.7  MG 2.3 2.2 2.3  GFRNONAA 26* 26* 26*  ANIONGAP 11 12 12     Lipids No results for input(s): CHOL, TRIG, HDL, LABVLDL, LDLCALC, CHOLHDL in the last 168 hours.  Hematology Recent Labs  Lab 10/07/24 0351 10/08/24 0426 10/09/24 0456  WBC 7.7 8.7 8.7  RBC 4.30 4.59 4.83  HGB 12.2 12.9 13.6  HCT 38.2 40.5 42.9  MCV 88.8 88.2 88.8  MCH 28.4 28.1 28.2  MCHC 31.9 31.9 31.7  RDW 13.6 13.7 13.4  PLT 188 206 213   Thyroid  No results for input(s): TSH, FREET4 in the last 168 hours.  BNP Recent Labs  Lab 10/05/24 1536  PROBNP 432.0*    DDimer No results for input(s): DDIMER in the last 168 hours.   Radiology  No  results found.   Cardiac Studies  06/11/24: TTE 1. Left ventricular ejection fraction, by estimation, is 35 to 40%. The  left ventricle has moderately decreased function. The left ventricle  demonstrates regional wall motion abnormalities (see scoring  diagram/findings for description). Left ventricular   diastolic parameters were normal.   2. No formed LV mural thrombus evident by Definity  contrast. Transverse  false tendon at apex.   3. Right ventricular systolic function is normal. The right ventricular  size is normal. Tricuspid regurgitation signal is inadequate for assessing  PA pressure.   4. The mitral valve is degenerative. Trivial mitral valve regurgitation.   5. The aortic valve was not well visualized. There is mild calcification  of the aortic valve. Aortic valve regurgitation is not visualized.   6. The inferior vena cava is normal in size with greater than 50%  respiratory variability, suggesting right atrial pressure of 3 mmHg.   Comparison(s): Prior images  reviewed side by side. LVEF 35-40% with wall  motion abnormalties consistent with ischemic cardiomyopathy.    Coronary angiography 06/12/2024: LM: Normal LAD: Patent prox-mid stent with only 20-30% late lumen loss Lcx: Large dominant vessel with mild OM2, mid OM disease        Severe diffuse disease in small caliber distal Lcx/OM branches RCA: Small nondominant vessel with mild diffuse disease LVEDP 26 mmHg Conclusion: No severe proximal epicardial disease Diffuse disease in distal Lcx/OM branches not amenable to PCI and unlikely to be the cause of low EF  Continue GMDT for HFrEF and polymorphic VT     Patient Profile   69 y.o. female w/PMHx of CKD (IIIb), GERD, GIB Aug 2021 (2/2 polypoid leasion underwent partial colectomy), HLD CAD (anterior MI in 2010 tx with PCI to LAD),  ICM, ICD,  SVT VT   Device information MDT single chamber ICD implanted 11/17/2017 H/o inappropriate tx in 2017 for SVT Hx of appropriate shocks for PMVT Sept 2025 >> Loaded on IV amio and underwent cath without intervention as below. Started on mexitil  with close coupled PVCs   Admitted with inappropriate shock for noise on her ICD lead   Assessment & Plan   Inappropriate ICD shock for noise on RV lead Lead fracture Acute change/rise in RV lead impedance >3,000 Ohms All tachy therapies have been programmed off  Suspect given young age, will look to a ICD system extraction and new implant She has a 6935 single coil RV lead implanted 09/28/2009 She is willing to stay in-patient until new device/lead system is addressed.  Planning for ICD system extraction with new implant Thursday afternoon Dr. Shyrl back up  Dr. Almetta has seen/discussed with her the procedure, risk/benefit CTS APP saw her yesterday (pending MD), Patient appears to be a candidate for CT surgery backup of proposed procedure Might consider dual chamber device in d/w Dr. Kennyth given hx of an inappropriate shock for  SVT.    2.  CAD No symptoms Cath last year as above Home meds  3. VT (PMVT Chronic amiodarone /mexiletine Continue No arrhythmias here  4.  CKD Follow Creat seems to wobble looks about her new baseline No ACE/ARB, diuretics...  5.  ICM Volume stable   [facility] Please consult [email or web address] for contact info under    Signed, Charlies Macario Arthur, PA-C  10/09/2024, 8:48 AM    "

## 2024-10-09 NOTE — Care Management Important Message (Signed)
 Important Message  Patient Details  Name: [name] [name] MRN: [id number] Date of Birth: [birth year]   Important Message Given:  Yes - Medicare IM     Vonzell Arrie Sharps 10/09/2024, 8:38 AM

## 2024-10-10 ENCOUNTER — Encounter (HOSPITAL_COMMUNITY): Payer: Self-pay | Admitting: Cardiovascular Disease

## 2024-10-10 ENCOUNTER — Encounter (HOSPITAL_COMMUNITY)
Admission: EM | Disposition: A | Discharge status: Home / Self Care | Payer: Self-pay | Source: Home / Self Care | Attending: Cardiovascular Disease

## 2024-10-10 ENCOUNTER — Inpatient Hospital Stay (HOSPITAL_COMMUNITY)

## 2024-10-10 ENCOUNTER — Encounter (HOSPITAL_COMMUNITY): Payer: Self-pay | Admitting: Certified Registered"

## 2024-10-10 ENCOUNTER — Inpatient Hospital Stay (HOSPITAL_COMMUNITY): Payer: Self-pay | Admitting: Certified Registered"

## 2024-10-10 DIAGNOSIS — I2510 Atherosclerotic heart disease of native coronary artery without angina pectoris: Secondary | ICD-10-CM

## 2024-10-10 DIAGNOSIS — I472 Ventricular tachycardia, unspecified: Secondary | ICD-10-CM

## 2024-10-10 DIAGNOSIS — Z87891 Personal history of nicotine dependence: Secondary | ICD-10-CM

## 2024-10-10 DIAGNOSIS — T82118A Breakdown (mechanical) of other cardiac electronic device, initial encounter: Secondary | ICD-10-CM

## 2024-10-10 DIAGNOSIS — T82110A Breakdown (mechanical) of cardiac electrode, initial encounter: Secondary | ICD-10-CM

## 2024-10-10 DIAGNOSIS — Z95810 Presence of automatic (implantable) cardiac defibrillator: Secondary | ICD-10-CM

## 2024-10-10 DIAGNOSIS — Z955 Presence of coronary angioplasty implant and graft: Secondary | ICD-10-CM

## 2024-10-10 LAB — BASIC METABOLIC PANEL WITH GFR
Anion gap: 13 (ref 5–15)
BUN: 31 mg/dL — ABNORMAL HIGH (ref 8–23)
CO2: 25 mmol/L (ref 22–32)
Calcium: 9.7 mg/dL (ref 8.9–10.3)
Chloride: 100 mmol/L (ref 98–111)
Creatinine, Ser: 2.36 mg/dL — ABNORMAL HIGH (ref 0.44–1.00)
GFR, Estimated: 22 mL/min — ABNORMAL LOW
Glucose, Bld: 110 mg/dL — ABNORMAL HIGH (ref 70–99)
Potassium: 5.1 mmol/L (ref 3.5–5.1)
Sodium: 138 mmol/L (ref 135–145)

## 2024-10-10 LAB — CBC
HCT: 37.7 % (ref 36.0–46.0)
Hemoglobin: 12.1 g/dL (ref 12.0–15.0)
MCH: 28.3 pg (ref 26.0–34.0)
MCHC: 32.1 g/dL (ref 30.0–36.0)
MCV: 88.1 fL (ref 80.0–100.0)
Platelets: 187 K/uL (ref 150–400)
RBC: 4.28 MIL/uL (ref 3.87–5.11)
RDW: 13.4 % (ref 11.5–15.5)
WBC: 10.5 K/uL (ref 4.0–10.5)
nRBC: 0 % (ref 0.0–0.2)

## 2024-10-10 LAB — CBC WITH DIFFERENTIAL/PLATELET
Abs Immature Granulocytes: 0.04 K/uL (ref 0.00–0.07)
Basophils Absolute: 0.1 K/uL (ref 0.0–0.1)
Basophils Relative: 1 %
Eosinophils Absolute: 0.2 K/uL (ref 0.0–0.5)
Eosinophils Relative: 2 %
HCT: 40.6 % (ref 36.0–46.0)
Hemoglobin: 13.1 g/dL (ref 12.0–15.0)
Immature Granulocytes: 0 %
Lymphocytes Relative: 25 %
Lymphs Abs: 2.3 K/uL (ref 0.7–4.0)
MCH: 28.1 pg (ref 26.0–34.0)
MCHC: 32.3 g/dL (ref 30.0–36.0)
MCV: 87.1 fL (ref 80.0–100.0)
Monocytes Absolute: 1 K/uL (ref 0.1–1.0)
Monocytes Relative: 11 %
Neutro Abs: 5.6 K/uL (ref 1.7–7.7)
Neutrophils Relative %: 61 %
Platelets: 216 K/uL (ref 150–400)
RBC: 4.66 MIL/uL (ref 3.87–5.11)
RDW: 13.4 % (ref 11.5–15.5)
WBC: 9.2 K/uL (ref 4.0–10.5)
nRBC: 0 % (ref 0.0–0.2)

## 2024-10-10 LAB — MAGNESIUM: Magnesium: 2.3 mg/dL (ref 1.7–2.4)

## 2024-10-10 LAB — LACTIC ACID, PLASMA: Lactic Acid, Venous: 1.8 mmol/L (ref 0.5–1.9)

## 2024-10-10 MED ORDER — NOREPINEPHRINE BITARTRATE 1 MG/ML IV SOLN
INTRAVENOUS | Status: DC | PRN
  Administered 2024-10-10: .5 mL via INTRAVENOUS

## 2024-10-10 MED ORDER — DEXAMETHASONE SOD PHOSPHATE PF 10 MG/ML IJ SOLN
INTRAMUSCULAR | Status: DC | PRN
  Administered 2024-10-10: 5 mg via INTRAVENOUS

## 2024-10-10 MED ORDER — AMISULPRIDE (ANTIEMETIC) 5 MG/2ML IV SOLN: 10.0000 mg | Freq: Once | INTRAVENOUS | Status: DC | PRN

## 2024-10-10 MED ORDER — NOREPINEPHRINE BITARTRATE 1 MG/ML IV SOLN: INTRAVENOUS | Status: DC | PRN

## 2024-10-10 MED ORDER — SUGAMMADEX SODIUM 200 MG/2ML IV SOLN
INTRAVENOUS | Status: DC | PRN
  Administered 2024-10-10: 200 mg via INTRAVENOUS

## 2024-10-10 MED ORDER — OXYCODONE HCL 5 MG PO TABS: 5.0000 mg | ORAL_TABLET | Freq: Once | ORAL | Status: DC | PRN

## 2024-10-10 MED ORDER — LIDOCAINE 2% (20 MG/ML) 5 ML SYRINGE
INTRAMUSCULAR | Status: DC | PRN
  Administered 2024-10-10: 100 mg via INTRAVENOUS

## 2024-10-10 MED ORDER — FENTANYL CITRATE (PF) 100 MCG/2ML IJ SOLN: 25.0000 ug | INTRAMUSCULAR | Status: DC | PRN

## 2024-10-10 MED ORDER — SODIUM CHLORIDE 0.9 % IV SOLN
INTRAVENOUS | Status: AC
  Filled 2024-10-10: qty 2

## 2024-10-10 MED ORDER — ROCURONIUM BROMIDE 10 MG/ML (PF) SYRINGE
PREFILLED_SYRINGE | INTRAVENOUS | Status: DC | PRN
  Administered 2024-10-10: 60 mg via INTRAVENOUS

## 2024-10-10 MED ORDER — CHLORHEXIDINE GLUCONATE 0.12 % MT SOLN
15.0000 mL | Freq: Once | OROMUCOSAL | Status: AC
  Administered 2024-10-10: 15 mL via OROMUCOSAL

## 2024-10-10 MED ORDER — EPINEPHRINE HCL 5 MG/250ML IV SOLN IN NS: INTRAVENOUS | Status: DC | PRN

## 2024-10-10 MED ORDER — OXYCODONE HCL 5 MG/5ML PO SOLN: 5.0000 mg | Freq: Once | ORAL | Status: DC | PRN

## 2024-10-10 MED ORDER — HEPARIN (PORCINE) IN NACL 1000-0.9 UT/500ML-% IV SOLN
INTRAVENOUS | Status: DC | PRN
  Administered 2024-10-10: 500 mL

## 2024-10-10 MED ORDER — PHENYLEPHRINE HCL-NACL 20-0.9 MG/250ML-% IV SOLN
INTRAVENOUS | Status: DC | PRN
  Administered 2024-10-10: 25 ug/min via INTRAVENOUS

## 2024-10-10 MED ORDER — FENTANYL CITRATE (PF) 100 MCG/2ML IJ SOLN
INTRAMUSCULAR | Status: AC
  Filled 2024-10-10: qty 2

## 2024-10-10 MED ORDER — CEFAZOLIN SODIUM-DEXTROSE 2-4 GM/100ML-% IV SOLN
INTRAVENOUS | Status: AC
  Filled 2024-10-10: qty 100

## 2024-10-10 MED ORDER — ACETAMINOPHEN 10 MG/ML IV SOLN: 1000.0000 mg | Freq: Once | INTRAVENOUS | Status: DC | PRN

## 2024-10-10 MED ORDER — PHENYLEPHRINE 80 MCG/ML (10ML) SYRINGE FOR IV PUSH (FOR BLOOD PRESSURE SUPPORT)
PREFILLED_SYRINGE | INTRAVENOUS | Status: DC | PRN
  Administered 2024-10-10: 40 ug via INTRAVENOUS
  Administered 2024-10-10: 80 ug via INTRAVENOUS
  Administered 2024-10-10: 160 ug via INTRAVENOUS
  Administered 2024-10-10: 40 ug via INTRAVENOUS

## 2024-10-10 MED ORDER — ALBUMIN HUMAN 5 % IV SOLN: INTRAVENOUS | Status: DC | PRN

## 2024-10-10 MED ORDER — ONDANSETRON HCL 4 MG/2ML IJ SOLN: 4.0000 mg | Freq: Once | INTRAMUSCULAR | Status: DC | PRN

## 2024-10-10 MED ORDER — LACTATED RINGERS IV SOLN: INTRAVENOUS | Status: DC

## 2024-10-10 MED ORDER — FENTANYL CITRATE (PF) 250 MCG/5ML IJ SOLN
INTRAMUSCULAR | Status: DC | PRN
  Administered 2024-10-10 (×2): 25 ug via INTRAVENOUS
  Administered 2024-10-10: 50 ug via INTRAVENOUS

## 2024-10-10 MED ORDER — ONDANSETRON HCL 4 MG/2ML IJ SOLN
INTRAMUSCULAR | Status: DC | PRN
  Administered 2024-10-10: 4 mg via INTRAVENOUS

## 2024-10-10 MED ORDER — PROPOFOL 10 MG/ML IV BOLUS
INTRAVENOUS | Status: DC | PRN
  Administered 2024-10-10: 30 mg via INTRAVENOUS
  Administered 2024-10-10: 20 mg via INTRAVENOUS

## 2024-10-10 NOTE — Plan of Care (Signed)

## 2024-10-10 NOTE — Progress Notes (Addendum)
 "  Rounding Note   Patient Name: [name] [name] Date of Encounter: 10/10/2024  [facility] Cardiologist: Ozell Fell, MD  EP: Dr. Inocencio  Subjective  Feels well, no new complaints or concerns, happy to be proceeding   Scheduled Meds:  amiodarone   200 mg Oral Daily   amLODipine   5 mg Oral QHS   atorvastatin   80 mg Oral Daily   chlorhexidine   60 mL Topical Once   DULoxetine   60 mg Oral Daily   gentamicin  (GARAMYCIN ) 80 mg in sodium chloride  0.9 % 500 mL irrigation  80 mg Irrigation On Call   levothyroxine   25 mcg Oral Daily   metoprolol  succinate  100 mg Oral QPM   mexiletine  150 mg Oral Q12H   pantoprazole   40 mg Oral Daily   sodium chloride  flush  3 mL Intravenous Q12H   sodium chloride  flush  3 mL Intravenous Q12H   Continuous Infusions:  sodium chloride  50 mL/hr at 10/10/24 9364    ceFAZolin  (ANCEF ) IV     PRN Meds: acetaminophen , ondansetron  (ZOFRAN ) IV, sodium chloride  flush, sodium chloride  flush   Vital Signs  Vitals:   10/09/24 2147 10/09/24 2334 10/10/24 0428 10/10/24 0836  BP: 111/69 109/65  104/60  Pulse:  (!) 58  (!) 53  Resp:  16  16  Temp:  97.8 F (36.6 C)  97.8 F (36.6 C)  TempSrc:  Oral  Oral  SpO2:  94%  95%  Weight:   64.5 kg   Height:        Intake/Output Summary (Last 24 hours) at 10/10/2024 0917 Last data filed at 10/09/2024 1000 Gross per 24 hour  Intake 280 ml  Output --  Net 280 ml      10/10/2024    4:28 AM 10/09/2024    5:08 AM 10/07/2024    5:28 AM  Last 3 Weights  Weight (lbs) 142 lb 1.6 oz 143 lb 3.2 oz 145 lb 3.2 oz  Weight (kg) 64.456 kg 64.955 kg 65.862 kg      Telemetry SR only - Personally Reviewed  ECG  No new EKGs - Personally Reviewed  Physical Exam  Exam remains unchanged GEN: No acute distress.   Neck: No JVD Cardiac: RRR, no murmurs, rubs, or gallops.  Respiratory: Clear to auscultation bilaterally. GI: Soft, nontender, non-distended  MS: No edema; No deformity. Neuro:  Nonfocal  Psych:  Normal affect, very pleasent  ICD pocket is stable  Labs High Sensitivity Troponin:  No results for input(s): TROPONINIHS in the last 720 hours. No results for input(s): TRNPT in the last 720 hours.     Chemistry Recent Labs  Lab 10/08/24 0426 10/09/24 0456 10/10/24 0359  NA 139 138 138  K 4.3 4.3 5.1  CL 102 100 100  CO2 25 26 25   GLUCOSE 99 100* 110*  BUN 25* 27* 31*  CREATININE 2.06* 2.07* 2.36*  CALCIUM  9.6 9.7 9.7  MG 2.2 2.3 2.3  GFRNONAA 26* 26* 22*  ANIONGAP 12 12 13     Lipids No results for input(s): CHOL, TRIG, HDL, LABVLDL, LDLCALC, CHOLHDL in the last 168 hours.  Hematology Recent Labs  Lab 10/08/24 0426 10/09/24 0456 10/10/24 0359  WBC 8.7 8.7 9.2  RBC 4.59 4.83 4.66  HGB 12.9 13.6 13.1  HCT 40.5 42.9 40.6  MCV 88.2 88.8 87.1  MCH 28.1 28.2 28.1  MCHC 31.9 31.7 32.3  RDW 13.7 13.4 13.4  PLT 206 213 216   Thyroid  No results for input(s):  TSH, FREET4 in the last 168 hours.  BNP Recent Labs  Lab 10/05/24 1536  PROBNP 432.0*    DDimer No results for input(s): DDIMER in the last 168 hours.   Radiology  No results found.   Cardiac Studies  06/11/24: TTE 1. Left ventricular ejection fraction, by estimation, is 35 to 40%. The  left ventricle has moderately decreased function. The left ventricle  demonstrates regional wall motion abnormalities (see scoring  diagram/findings for description). Left ventricular   diastolic parameters were normal.   2. No formed LV mural thrombus evident by Definity  contrast. Transverse  false tendon at apex.   3. Right ventricular systolic function is normal. The right ventricular  size is normal. Tricuspid regurgitation signal is inadequate for assessing  PA pressure.   4. The mitral valve is degenerative. Trivial mitral valve regurgitation.   5. The aortic valve was not well visualized. There is mild calcification  of the aortic valve. Aortic valve regurgitation is not visualized.   6. The  inferior vena cava is normal in size with greater than 50%  respiratory variability, suggesting right atrial pressure of 3 mmHg.   Comparison(s): Prior images reviewed side by side. LVEF 35-40% with wall  motion abnormalties consistent with ischemic cardiomyopathy.    Coronary angiography 06/12/2024: LM: Normal LAD: Patent prox-mid stent with only 20-30% late lumen loss Lcx: Large dominant vessel with mild OM2, mid OM disease        Severe diffuse disease in small caliber distal Lcx/OM branches RCA: Small nondominant vessel with mild diffuse disease LVEDP 26 mmHg Conclusion: No severe proximal epicardial disease Diffuse disease in distal Lcx/OM branches not amenable to PCI and unlikely to be the cause of low EF  Continue GMDT for HFrEF and polymorphic VT     Patient Profile   69 y.o. female w/PMHx of CKD (IIIb), GERD, GIB Aug 2021 (2/2 polypoid leasion underwent partial colectomy), HLD CAD (anterior MI in 2010 tx with PCI to LAD),  ICM, ICD,  SVT VT   Device information MDT single chamber ICD implanted 11/17/2017 H/o inappropriate tx in 2017 for SVT Hx of appropriate shocks for PMVT Sept 2025 >> Loaded on IV amio and underwent cath without intervention as below. Started on mexitil  with close coupled PVCs   Admitted with inappropriate shock for noise on her ICD lead   Assessment & Plan   Inappropriate ICD shock for noise on RV lead Lead fracture Acute change/rise in RV lead impedance >3,000 Ohms All tachy therapies have been programmed off  Suspect given young age, will look to a ICD system extraction and new implant She has a 6935 single coil RV lead implanted 09/28/2009  Planning for ICD system extraction with new dual chamber ICD implant  today CTS APP has seen the pt, deemed an appropriate surgical candidate Dr. Shyrl back up   Dr. Kennyth has seen the patient this morning, she had no follow up questions and remains agreeable to proceed  2.  CAD No  symptoms Cath last year as above Home meds  3. VT (PMVT Chronic amiodarone /mexiletine Continue No arrhythmias here  4.  CKD Follow Creat seems to wobble up today > getting IVF pre-op  No ACE/ARB, diuretics... Labs in AM  5.  ICM Volume stable    [facility] Please consult [email or web address] for contact info under    Signed, Charlies Macario Arthur, PA-C  10/10/2024, 9:17 AM    I have seen, examined the patient, and reviewed the above assessment and plan.  Interval:  No acute overnight events. Patient reports feeling relatively well. No new or acute complaints.   General: Well developed, in no acute distress.  Neck: No JVD.  Cardiac: Normal rate, regular rhythm.  Resp: Normal work of breathing.  Ext: No edema.  Neuro: No gross focal deficits.  Psych: Normal affect.   Assessment:  Ms. Lauman is a 69 y.o. female with ICM with CAD s/p anterior MI (2010) with PCI (BMS-LAD), HFrEF (LVEF 35-40%, 05/2024) s/p 1' prevention LEFT MDT ICD (2011) with subsequent VT/VF with appropriate therapies, hypothyroidism, and HTN who presents with RV lead noise and inappropriate ICD therapies.    Problem List:  CAD with HFrEF LEFT MDT Maynard VVI ICD (2011)  Appropriate VT/VF therapies prior  RV lead fracture  Plan:  - Extraction of old RV ICD lead today with cardiac surgery backup. Then reimplantation of new dual chamber ICD. Risks and benefits of extraction and reimplantation have been discussed extensively, including but not limited to chance of emergent cardiac surgery or death. Patient voiced understanding and elected to proceed today.   [name], MD 10/10/2024 2:56 PM  "

## 2024-10-10 NOTE — Interval H&P Note (Signed)
 History and Physical Interval Note:  10/10/2024 3:00 PM  [name] [name]  has presented today for surgery, with the diagnosis of RV ICD lead malfunction.  The various methods of treatment have been discussed with the patient and family. After consideration of risks, benefits and other options for treatment, the patient has consented to  Procedures: LEAD EXTRACTION (N/A) ICD IMPLANT (N/A) as a surgical intervention.  The patient's history has been reviewed, patient examined, no change in status, stable for surgery.  I have reviewed the patient's chart and labs.  Questions were answered to the patient's satisfaction.     [name] [name]

## 2024-10-10 NOTE — H&P (View-Only) (Signed)
 "  Rounding Note   Patient Name: [name] [name] Date of Encounter: 10/10/2024   [facility] Cardiologist: Ozell Fell, MD  EP: Dr. Inocencio  Subjective  Feels well, no new complaints or concerns, happy to be proceeding   Scheduled Meds:  amiodarone   200 mg Oral Daily   amLODipine   5 mg Oral QHS   atorvastatin   80 mg Oral Daily   chlorhexidine   60 mL Topical Once   DULoxetine   60 mg Oral Daily   gentamicin  (GARAMYCIN ) 80 mg in sodium chloride  0.9 % 500 mL irrigation  80 mg Irrigation On Call   levothyroxine   25 mcg Oral Daily   metoprolol  succinate  100 mg Oral QPM   mexiletine  150 mg Oral Q12H   pantoprazole   40 mg Oral Daily   sodium chloride  flush  3 mL Intravenous Q12H   sodium chloride  flush  3 mL Intravenous Q12H   Continuous Infusions:  sodium chloride  50 mL/hr at 10/10/24 9364    ceFAZolin  (ANCEF ) IV     PRN Meds: acetaminophen , ondansetron  (ZOFRAN ) IV, sodium chloride  flush, sodium chloride  flush   Vital Signs  Vitals:   10/09/24 2147 10/09/24 2334 10/10/24 0428 10/10/24 0836  BP: 111/69 109/65  104/60  Pulse:  (!) 58  (!) 53  Resp:  16  16  Temp:  97.8 F (36.6 C)  97.8 F (36.6 C)  TempSrc:  Oral  Oral  SpO2:  94%  95%  Weight:   64.5 kg   Height:        Intake/Output Summary (Last 24 hours) at 10/10/2024 0917 Last data filed at 10/09/2024 1000 Gross per 24 hour  Intake 280 ml  Output --  Net 280 ml      10/10/2024    4:28 AM 10/09/2024    5:08 AM 10/07/2024    5:28 AM  Last 3 Weights  Weight (lbs) 142 lb 1.6 oz 143 lb 3.2 oz 145 lb 3.2 oz  Weight (kg) 64.456 kg 64.955 kg 65.862 kg      Telemetry SR only - Personally Reviewed  ECG  No new EKGs - Personally Reviewed  Physical Exam  Exam remains unchanged GEN: No acute distress.   Neck: No JVD Cardiac: RRR, no murmurs, rubs, or gallops.  Respiratory: Clear to auscultation bilaterally. GI: Soft, nontender, non-distended  MS: No edema; No deformity. Neuro:  Nonfocal  Psych:  Normal affect, very pleasent  ICD pocket is stable  Labs High Sensitivity Troponin:  No results for input(s): TROPONINIHS in the last 720 hours. No results for input(s): TRNPT in the last 720 hours.     Chemistry Recent Labs  Lab 10/08/24 0426 10/09/24 0456 10/10/24 0359  NA 139 138 138  K 4.3 4.3 5.1  CL 102 100 100  CO2 25 26 25   GLUCOSE 99 100* 110*  BUN 25* 27* 31*  CREATININE 2.06* 2.07* 2.36*  CALCIUM  9.6 9.7 9.7  MG 2.2 2.3 2.3  GFRNONAA 26* 26* 22*  ANIONGAP 12 12 13     Lipids No results for input(s): CHOL, TRIG, HDL, LABVLDL, LDLCALC, CHOLHDL in the last 168 hours.  Hematology Recent Labs  Lab 10/08/24 0426 10/09/24 0456 10/10/24 0359  WBC 8.7 8.7 9.2  RBC 4.59 4.83 4.66  HGB 12.9 13.6 13.1  HCT 40.5 42.9 40.6  MCV 88.2 88.8 87.1  MCH 28.1 28.2 28.1  MCHC 31.9 31.7 32.3  RDW 13.7 13.4 13.4  PLT 206 213 216   Thyroid  No results for input(s):  TSH, FREET4 in the last 168 hours.  BNP Recent Labs  Lab 10/05/24 1536  PROBNP 432.0*    DDimer No results for input(s): DDIMER in the last 168 hours.   Radiology  No results found.   Cardiac Studies  06/11/24: TTE 1. Left ventricular ejection fraction, by estimation, is 35 to 40%. The  left ventricle has moderately decreased function. The left ventricle  demonstrates regional wall motion abnormalities (see scoring  diagram/findings for description). Left ventricular   diastolic parameters were normal.   2. No formed LV mural thrombus evident by Definity  contrast. Transverse  false tendon at apex.   3. Right ventricular systolic function is normal. The right ventricular  size is normal. Tricuspid regurgitation signal is inadequate for assessing  PA pressure.   4. The mitral valve is degenerative. Trivial mitral valve regurgitation.   5. The aortic valve was not well visualized. There is mild calcification  of the aortic valve. Aortic valve regurgitation is not visualized.   6. The  inferior vena cava is normal in size with greater than 50%  respiratory variability, suggesting right atrial pressure of 3 mmHg.   Comparison(s): Prior images reviewed side by side. LVEF 35-40% with wall  motion abnormalties consistent with ischemic cardiomyopathy.    Coronary angiography 06/12/2024: LM: Normal LAD: Patent prox-mid stent with only 20-30% late lumen loss Lcx: Large dominant vessel with mild OM2, mid OM disease        Severe diffuse disease in small caliber distal Lcx/OM branches RCA: Small nondominant vessel with mild diffuse disease LVEDP 26 mmHg Conclusion: No severe proximal epicardial disease Diffuse disease in distal Lcx/OM branches not amenable to PCI and unlikely to be the cause of low EF  Continue GMDT for HFrEF and polymorphic VT     Patient Profile   69 y.o. female w/PMHx of CKD (IIIb), GERD, GIB Aug 2021 (2/2 polypoid leasion underwent partial colectomy), HLD CAD (anterior MI in 2010 tx with PCI to LAD),  ICM, ICD,  SVT VT   Device information MDT single chamber ICD implanted 11/17/2017 H/o inappropriate tx in 2017 for SVT Hx of appropriate shocks for PMVT Sept 2025 >> Loaded on IV amio and underwent cath without intervention as below. Started on mexitil  with close coupled PVCs   Admitted with inappropriate shock for noise on her ICD lead   Assessment & Plan   Inappropriate ICD shock for noise on RV lead Lead fracture Acute change/rise in RV lead impedance >3,000 Ohms All tachy therapies have been programmed off  Suspect given young age, will look to a ICD system extraction and new implant She has a 6935 single coil RV lead implanted 09/28/2009  Planning for ICD system extraction with new dual chamber ICD implant  today CTS APP has seen the pt, deemed an appropriate surgical candidate Dr. Shyrl back up   Dr. Kennyth has seen the patient this morning, she had no follow up questions and remains agreeable to proceed  2.  CAD No  symptoms Cath last year as above Home meds  3. VT (PMVT Chronic amiodarone /mexiletine Continue No arrhythmias here  4.  CKD Follow Creat seems to wobble up today > getting IVF pre-op  No ACE/ARB, diuretics... Labs in AM  5.  ICM Volume stable   [facility] Please consult [email or web address] for contact info under    Signed, Charlies Macario Arthur, PA-C  10/10/2024, 9:17 AM    I have seen, examined the patient, and reviewed the above assessment and plan.  Interval:  No acute overnight events. Patient reports feeling relatively well. No new or acute complaints.   General: Well developed, in no acute distress.  Neck: No JVD.  Cardiac: Normal rate, regular rhythm.  Resp: Normal work of breathing.  Ext: No edema.  Neuro: No gross focal deficits.  Psych: Normal affect.   Assessment:  Ms. Burrowes is a 69 y.o. female with ICM with CAD s/p anterior MI (2010) with PCI (BMS-LAD), HFrEF (LVEF 35-40%, 05/2024) s/p 1' prevention LEFT MDT ICD (2011) with subsequent VT/VF with appropriate therapies, hypothyroidism, and HTN who presents with RV lead noise and inappropriate ICD therapies.    Problem List:  CAD with HFrEF LEFT MDT [place] VVI ICD (2011)  Appropriate VT/VF therapies prior  RV lead fracture  Plan:  - Extraction of old RV ICD lead today with cardiac surgery backup. Then reimplantation of new dual chamber ICD. Risks and benefits of extraction and reimplantation have been discussed extensively, including but not limited to chance of emergent cardiac surgery or death. Patient voiced understanding and elected to proceed today.   [name], MD 10/10/2024 2:56 PM  "

## 2024-10-10 NOTE — Anesthesia Preprocedure Evaluation (Addendum)
"                                    Anesthesia Evaluation  Patient identified by MRN, date of birth, ID band Patient awake    Reviewed: Allergy & Precautions, NPO status , Patient's Chart, lab work & pertinent test results, reviewed documented beta blocker date and time   History of Anesthesia Complications Negative for: history of anesthetic complications  Airway Mallampati: II  TM Distance: >3 FB Neck ROM: Full    Dental  (+) Dental Advisory Given, Edentulous Upper, Edentulous Lower   Pulmonary former smoker   breath sounds clear to auscultation       Cardiovascular hypertension, Pt. on medications and Pt. on home beta blockers + CAD, + Past MI and + Cardiac Stents  + dysrhythmias Supra Ventricular Tachycardia and Ventricular Tachycardia + Cardiac Defibrillator  Rhythm:Regular Rate:Normal   TTE (2025):  1. Left ventricular ejection fraction, by estimation, is 35 to 40%. The  left ventricle has moderately decreased function. The left ventricle  demonstrates regional wall motion abnormalities (see scoring  diagram/findings for description). Left ventricular   diastolic parameters were normal.   2. No formed LV mural thrombus evident by Definity  contrast. Transverse  false tendon at apex.   3. Right ventricular systolic function is normal. The right ventricular  size is normal. Tricuspid regurgitation signal is inadequate for assessing  PA pressure.   4. The mitral valve is degenerative. Trivial mitral valve regurgitation.   5. The aortic valve was not well visualized. There is mild calcification  of the aortic valve. Aortic valve regurgitation is not visualized.   6. The inferior vena cava is normal in size with greater than 50%  respiratory variability, suggesting right atrial pressure of 3 mmHg.      Neuro/Psych    Depression       GI/Hepatic Neg liver ROS, PUD,GERD  Medicated and Controlled,,  Endo/Other    Renal/GU Renal InsufficiencyRenal disease      Musculoskeletal  Cervical Disc Disease    Abdominal   Peds  Hematology  (+) Blood dyscrasia, anemia   Anesthesia Other Findings   Reproductive/Obstetrics                              Anesthesia Physical Anesthesia Plan  ASA: 3  Anesthesia Plan: General   Post-op Pain Management:    Induction: Intravenous  PONV Risk Score and Plan: 2 and Ondansetron , Dexamethasone  and Treatment may vary due to age or medical condition  Airway Management Planned: Oral ETT  Additional Equipment: Arterial line and TEE  Intra-op Plan:   Post-operative Plan: Extubation in OR  Informed Consent: I have reviewed the patients History and Physical, chart, labs and discussed the procedure including the risks, benefits and alternatives for the proposed anesthesia with the patient or authorized representative who has indicated his/her understanding and acceptance.     Dental advisory given  Plan Discussed with: CRNA and Surgeon  Anesthesia Plan Comments:          Anesthesia Quick Evaluation  "

## 2024-10-10 NOTE — Progress Notes (Signed)
 SBP is in the high 90s low 100s. Holding Metoprolol  and Norvasc  Per [place] MD

## 2024-10-10 NOTE — Anesthesia Procedure Notes (Signed)
 Procedure Name: Intubation Date/Time: 10/10/2024 3:36 PM  Performed by: Kearney Rosina SAILOR, RNPre-anesthesia Checklist: Patient identified, Emergency Drugs available, Suction available and Patient being monitored Patient Re-evaluated:Patient Re-evaluated prior to induction Oxygen Delivery Method: Circle system utilized Preoxygenation: Pre-oxygenation with 100% oxygen Induction Type: IV induction Ventilation: Mask ventilation without difficulty and Oral airway inserted - appropriate to patient size Laryngoscope Size: Glidescope and 3 Grade View: Grade I Tube type: Oral Tube size: 7.0 mm Number of attempts: 1 Airway Equipment and Method: Stylet and Oral airway Placement Confirmation: ETT inserted through vocal cords under direct vision, positive ETCO2 and breath sounds checked- equal and bilateral Secured at: 22 cm Tube secured with: Tape Dental Injury: Teeth and Oropharynx as per pre-operative assessment  Comments: Atraumatic placement.

## 2024-10-10 NOTE — Progress Notes (Signed)
" °  Echocardiogram Echocardiogram Transesophageal has been performed.  Koleen KANDICE Popper, RDCS 10/10/2024, 5:26 PM "

## 2024-10-10 NOTE — Transfer of Care (Signed)
 Immediate Anesthesia Transfer of Care Note  Patient: [name] [name]  Procedure(s) Performed: LEAD EXTRACTION ICD IMPLANT  Patient Location: PACU  Anesthesia Type:General  Level of Consciousness: awake, alert , and oriented  Airway & Oxygen Therapy: Patient Spontanous Breathing  Post-op Assessment: Report given to RN and Post -op Vital signs reviewed and stable  Post vital signs: Reviewed and stable  Last Vitals:  Vitals Value Taken Time  BP 132/56 10/10/24 18:15  Temp 36.9 C 10/10/24 18:07  Pulse 62 10/10/24 18:19  Resp 15 10/10/24 18:19  SpO2 92 % 10/10/24 18:19  Vitals shown include unfiled device data.  Last Pain:  Vitals:   10/10/24 1807  TempSrc:   PainSc: 0-No pain         Complications: There were no known notable events for this encounter.

## 2024-10-10 NOTE — Anesthesia Procedure Notes (Signed)
 Arterial Line Insertion Start/End1/22/2026 2:45 PM, 10/10/2024 2:55 PM Performed by: Dene Lauraine DASEN, MD, Kearney Rosina SAILOR, RN, CRNA  Patient location: Pre-op . Preanesthetic checklist: patient identified, IV checked, site marked, risks and benefits discussed, surgical consent, monitors and equipment checked, pre-op  evaluation, timeout performed and anesthesia consent Lidocaine  1% used for infiltration Right, radial was placed Catheter size: 20 G Hand hygiene performed , maximum sterile barriers used  and Seldinger technique used  Attempts: 1 Procedure performed using ultrasound to evaluate access site. Ultrasound Notes:relevant anatomy identified, ultrasound used to visualize needle entry and vessel patent under ultrasound. Following insertion, dressing applied. Post procedure assessment: normal and unchanged  Patient tolerated the procedure well with no immediate complications. Additional procedure comments: atraumatic.

## 2024-10-11 ENCOUNTER — Inpatient Hospital Stay (HOSPITAL_COMMUNITY)

## 2024-10-11 ENCOUNTER — Other Ambulatory Visit: Payer: Self-pay | Admitting: Physician Assistant

## 2024-10-11 DIAGNOSIS — N1832 Chronic kidney disease, stage 3b: Secondary | ICD-10-CM

## 2024-10-11 DIAGNOSIS — I130 Hypertensive heart and chronic kidney disease with heart failure and stage 1 through stage 4 chronic kidney disease, or unspecified chronic kidney disease: Secondary | ICD-10-CM

## 2024-10-11 DIAGNOSIS — T82118S Breakdown (mechanical) of other cardiac electronic device, sequela: Secondary | ICD-10-CM

## 2024-10-11 LAB — BPAM RBC
Blood Product Expiration Date: 202602152359
Blood Product Expiration Date: 202602162359
Blood Product Expiration Date: 202602162359
Blood Product Expiration Date: 202602162359
Blood Product Expiration Date: 202602182359
Blood Product Unit Number: 202602152359
Blood Product Unit Number: 202602152359
ISSUE DATE / TIME: 202601221454
ISSUE DATE / TIME: 202602152359
PRODUCT CODE: 202601221454
PRODUCT CODE: 202602152359
PRODUCT CODE: 202602152359
Unit Type and Rh: 202601221454
Unit Type and Rh: 202602152359
Unit Type and Rh: 202602152359
Unit Type and Rh: 5100
Unit Type and Rh: 5100
Unit Type and Rh: 5100
Unit Type and Rh: 5100
Unit Type and Rh: 5100
Unit Type and Rh: 5100
Unit Type and Rh: 5100
Unit Type and Rh: 5100
Unit Type and Rh: 5100

## 2024-10-11 LAB — TYPE AND SCREEN
ABO/RH(D): O POS
Antibody Screen: NEGATIVE
Unit division: 0
Unit division: 0
Unit division: 0
Unit division: 0
Unit division: 0
Unit division: 0
Unit division: 0
Unit division: 0

## 2024-10-11 LAB — BASIC METABOLIC PANEL WITH GFR
Anion gap: 13 (ref 5–15)
Anion gap: 12 (ref 5–15)
BUN: 30 mg/dL — ABNORMAL HIGH (ref 8–23)
BUN: 32 mg/dL — ABNORMAL HIGH (ref 8–23)
CO2: 24 mmol/L (ref 22–32)
CO2: 23 mmol/L (ref 22–32)
Calcium: 8.9 mg/dL (ref 8.9–10.3)
Calcium: 9 mg/dL (ref 8.9–10.3)
Chloride: 100 mmol/L (ref 98–111)
Chloride: 101 mmol/L (ref 98–111)
Creatinine, Ser: 2.46 mg/dL — ABNORMAL HIGH (ref 0.44–1.00)
Creatinine, Ser: 2.27 mg/dL — ABNORMAL HIGH (ref 0.44–1.00)
GFR, Estimated: 21 mL/min — ABNORMAL LOW
GFR, Estimated: 23 mL/min — ABNORMAL LOW
Glucose, Bld: 211 mg/dL — ABNORMAL HIGH (ref 70–99)
Glucose, Bld: 122 mg/dL — ABNORMAL HIGH (ref 70–99)
Potassium: 4.3 mmol/L (ref 3.5–5.1)
Potassium: 4.5 mmol/L (ref 3.5–5.1)
Sodium: 137 mmol/L (ref 135–145)
Sodium: 136 mmol/L (ref 135–145)

## 2024-10-11 LAB — CBC WITH DIFFERENTIAL/PLATELET
Abs Immature Granulocytes: 0.07 K/uL (ref 0.00–0.07)
Basophils Absolute: 0 K/uL (ref 0.0–0.1)
Basophils Relative: 0 %
Eosinophils Absolute: 0 K/uL (ref 0.0–0.5)
Eosinophils Relative: 0 %
HCT: 36.3 % (ref 36.0–46.0)
Hemoglobin: 11.7 g/dL — ABNORMAL LOW (ref 12.0–15.0)
Immature Granulocytes: 1 %
Lymphocytes Relative: 11 %
Lymphs Abs: 1.3 K/uL (ref 0.7–4.0)
MCH: 28.2 pg (ref 26.0–34.0)
MCHC: 32.2 g/dL (ref 30.0–36.0)
MCV: 87.5 fL (ref 80.0–100.0)
Monocytes Absolute: 0.7 K/uL (ref 0.1–1.0)
Monocytes Relative: 6 %
Neutro Abs: 9.6 K/uL — ABNORMAL HIGH (ref 1.7–7.7)
Neutrophils Relative %: 82 %
Platelets: 194 K/uL (ref 150–400)
RBC: 4.15 MIL/uL (ref 3.87–5.11)
RDW: 13.3 % (ref 11.5–15.5)
WBC: 11.8 K/uL — ABNORMAL HIGH (ref 4.0–10.5)
nRBC: 0 % (ref 0.0–0.2)

## 2024-10-11 LAB — MAGNESIUM: Magnesium: 2.2 mg/dL (ref 1.7–2.4)

## 2024-10-11 MED ORDER — PHENYLEPHRINE HCL-NACL 20-0.9 MG/250ML-% IV SOLN
INTRAVENOUS | Status: AC
  Filled 2024-10-11: qty 500

## 2024-10-11 MED ORDER — NITROGLYCERIN IN D5W 200-5 MCG/ML-% IV SOLN
INTRAVENOUS | Status: AC
  Filled 2024-10-11: qty 250

## 2024-10-11 MED ORDER — PROPOFOL 1000 MG/100ML IV EMUL
INTRAVENOUS | Status: AC
  Filled 2024-10-11: qty 200

## 2024-10-11 MED FILL — Fentanyl Citrate Preservative Free (PF) Inj 100 MCG/2ML: INTRAMUSCULAR | Qty: 2 | Status: AC

## 2024-10-11 NOTE — Discharge Summary (Signed)
 "      ELECTROPHYSIOLOGY PROCEDURE DISCHARGE SUMMARY    Patient ID: [name] [name],  MRN: [id number], DOB/AGE: April 09, 1956 69 y.o.  Admit date: 10/05/2024 Discharge date: 10/11/2024  Primary Care Physician: Dorcus Lamar POUR, MD  Primary Cardiologist: Dr. Wonda Electrophysiologist: Dr. Inocencio  Primary Discharge Diagnosis:  ICD lead failure Inappropriate ICD shock  Secondary Discharge Diagnosis:  CAD SVT VT ICM  Allergies[1]   Procedures This Admission:  10/10/24: CONCLUSIONS:   1. Successful extraction of old RV ICD lead.   2. Successful implantation of new dual chamber ICD for secondary prevention.  3. No early apparent complications.  CXR on 10/11/24 demonstrated no pneumothorax status post device implantation.   Brief HPI: [name] [name] is a 69 y.o. female was admitted for ICD shocks Interrogation of her ICD noted acute risk in lead impedance and inapprorpriate shock for noise on her RV lead Tachy therapies programmed off and admitted for further management.  Hospital Course:  The patient was admitted she had no symptoms outside of having been shocked.  Labs largely unremarkable noting elevated Creat at about what appears to be her new baseline since last fall's admission.   Was decided to pursue ICD system/lead and underwent the extraction and new dual chamber ICD implant, details as noted in the procedure report.  She was monitored on telemetry throughout her stay demonstrated SR, no arrhythmias.  Left chest was without hematoma or ecchymosis.  The device was interrogated and found to be functioning normally.  CXR was obtained and demonstrated no pneumothorax status post device implantation.  Wound care, arm mobility, and restrictions were reviewed with the patient.  The patient feels well, denies any CP or SOB, minimal site discomfort, she was examined by Dr. Almetta and considered stable for discharge to home.   Creat today, day of discharge is down trending BMET ordered  for day of her wound check visit  Physical Exam: Vitals:   10/10/24 2300 10/11/24 0003 10/11/24 0400 10/11/24 0836  BP:  99/60 109/67 118/71  Pulse: 68 65 66 65  Resp:  18 20 18   Temp:  97.8 F (36.6 C) 98.1 F (36.7 C) 98.1 F (36.7 C)  TempSrc:  Oral Oral Oral  SpO2: 95% 96% 96% 98%  Weight:   65.3 kg   Height:        GEN- The patient is well appearing, alert and oriented x 3 today.   HEENT: normocephalic, atraumatic; sclera clear, conjunctiva pink; hearing intact; oropharynx clear Lungs- CTA b/l, normal work of breathing.  No wheezes, rales, rhonchi Heart- RRR, no murmurs, rubs or gallops, PMI not laterally displaced GI- soft, non-tender, non-distended Extremities- no clubbing, cyanosis, or edema MS- no significant deformity or atrophy Skin- warm and dry, no rash or lesion, left chest without hematoma/ecchymosis Psych- euthymic mood, full affect Neuro- no gross defecits  Labs:   Lab Results  Component Value Date   WBC 11.8 (H) 10/11/2024   HGB 11.7 (L) 10/11/2024   HCT 36.3 10/11/2024   MCV 87.5 10/11/2024   PLT 194 10/11/2024    Recent Labs  Lab 10/11/24 0559  NA 136  K 4.5  CL 101  CO2 23  BUN 32*  CREATININE 2.27*  CALCIUM  9.0  GLUCOSE 122*    Discharge Medications:  Allergies as of 10/11/2024   No Known Allergies      Medication List     TAKE these medications    acetaminophen  325 MG tablet Commonly known as: TYLENOL  Take 650 mg  by mouth every 6 (six) hours as needed for mild pain or moderate pain.   amiodarone  200 MG tablet Commonly known as: PACERONE  Take 1 tablet (200 mg total) by mouth 2 (two) times daily.   amLODipine  5 MG tablet Commonly known as: NORVASC  Take 1 tablet (5 mg total) by mouth at bedtime.   aspirin  EC 81 MG tablet Take 1 tablet (81 mg total) by mouth daily.   atorvastatin  80 MG tablet Commonly known as: LIPITOR Take 1 tablet (80 mg total) by mouth daily.   DULoxetine  30 MG capsule Commonly known as:  CYMBALTA  Take 90 mg by mouth daily.   levothyroxine  25 MCG tablet Commonly known as: SYNTHROID  Take 25 mcg by mouth daily.   metoprolol  succinate 100 MG 24 hr tablet Commonly known as: TOPROL -XL Take 1 tablet (100 mg total) by mouth every evening. Take with or immediately following a meal.   mexiletine 150 MG capsule Commonly known as: MEXITIL  Take 1 capsule (150 mg total) by mouth every 12 (twelve) hours.   nitroGLYCERIN  0.4 MG SL tablet Commonly known as: NITROSTAT  Place 1 tablet (0.4 mg total) under the tongue every 5 (five) minutes x 3 doses as needed for chest pain.   pantoprazole  40 MG tablet Commonly known as: PROTONIX  Take 1 tablet (40 mg total) by mouth daily.               Discharge Care Instructions  (From admission, onward)           Start     Ordered   10/11/24 0000  Discharge wound care:       Comments: As per AVS   10/11/24 1031            Disposition: Home Discharge Instructions     Discharge wound care:   Complete by: As directed    As per AVS   Increase activity slowly   Complete by: As directed         Duration of Discharge Encounter: 15 minutes, APP time.  Bonney Charlies Arthur, PA-C 10/11/2024 10:31 AM         [1] No Known Allergies  "

## 2024-10-11 NOTE — Plan of Care (Signed)
" °  Problem: Health Behavior/Discharge Planning: Goal: Ability to manage health-related needs will improve Outcome: Adequate for Discharge   Problem: Education: Goal: Knowledge of General Education information will improve Description: Including pain rating scale, medication(s)/side effects and non-pharmacologic comfort measures Outcome: Adequate for Discharge   Problem: Clinical Measurements: Goal: Ability to maintain clinical measurements within normal limits will improve Outcome: Adequate for Discharge Goal: Will remain free from infection Outcome: Adequate for Discharge Goal: Diagnostic test results will improve Outcome: Adequate for Discharge Goal: Respiratory complications will improve Outcome: Adequate for Discharge Goal: Cardiovascular complication will be avoided Outcome: Adequate for Discharge   Problem: Activity: Goal: Risk for activity intolerance will decrease Outcome: Adequate for Discharge   Problem: Coping: Goal: Level of anxiety will decrease Outcome: Adequate for Discharge   Problem: Elimination: Goal: Will not experience complications related to bowel motility Outcome: Adequate for Discharge Goal: Will not experience complications related to urinary retention Outcome: Adequate for Discharge   Problem: Pain Managment: Goal: General experience of comfort will improve and/or be controlled Outcome: Adequate for Discharge   Problem: Safety: Goal: Ability to remain free from injury will improve Outcome: Adequate for Discharge   Problem: Skin Integrity: Goal: Risk for impaired skin integrity will decrease Outcome: Adequate for Discharge   Problem: Education: Goal: Knowledge of cardiac device and self-care will improve Outcome: Progressing Goal: Ability to safely manage health related needs after discharge will improve Outcome: Progressing Goal: Individualized Educational Video(s) Outcome: Progressing   Problem: Cardiac: Goal: Ability to achieve and  maintain adequate cardiopulmonary perfusion will improve Outcome: Progressing   "

## 2024-10-11 NOTE — TOC Transition Note (Signed)
 Transition of Care [facility]) - Discharge Note   Patient Details  Name: [name] [name] MRN: [id number] Date of Birth: 1956/05/07  [facility] CM/SW Contact:  Sudie Erminio Deems, RN Phone Number: 10/11/2024, 11:02 AM   Clinical Narrative:  Patient will discharge home today. No home needs identified. Patient has transportation home.      Barriers to Discharge: No Barriers Identified   Patient Goals and CMS Choice Patient states their goals for this hospitalization and ongoing recovery are:: Plans to return home once stable.   Discharge Plan and Services Additional resources added to the After Visit Summary for     Discharge Planning Services: CM Consult Post Acute Care Choice: NA            DME Agency: NA       HH Arranged: NA   Social Drivers of Health (SDOH) Interventions SDOH Screenings   Food Insecurity: No Food Insecurity (10/06/2024)  Housing: Low Risk (10/06/2024)  Transportation Needs: No Transportation Needs (10/06/2024)  Utilities: Not At Risk (10/06/2024)  Financial Resource Strain: Low Risk (06/21/2024)   Received from [facility]  Physical Activity: Inactive (06/21/2024)   Received from [facility]  Social Connections: Socially Isolated (10/06/2024)  Stress: Stress Concern Present (06/21/2024)   Received from [facility]  Tobacco Use: Medium Risk (10/10/2024)     Readmission Risk Interventions     No data to display

## 2024-10-11 NOTE — Discharge Instructions (Addendum)
 After Your ICD (Implantable Cardiac Defibrillator)   You have a Medtronic ICD  If you have a Medtronic or Biotronik device, plug in your home monitor once you get home, and no manual interaction is required.   If you have an [employer] or [employer] device, plug your home monitor once you get home, sit near the device, and press the large activation button. Sit nearby until the process is complete, usually notated by lights on the monitor.   If you were set up for monitoring using an app on your phone, make sure the app remains open in the background and the Bluetooth remains on.  ACTIVITY Do not lift your arm above shoulder height for 1 week after your procedure. After 7 days, you may progress as below.  You should remove your sling 24 hours after your procedure, unless otherwise instructed by your provider.     Friday October 18, 2024  Saturday October 19, 2024 Sunday October 20, 2024 Monday October 21, 2024   Do not lift, push, pull, or carry anything over 10 pounds with the affected arm until 6 weeks (Friday November 22, 2024 ) after your procedure.   You may drive AFTER your wound check, UNLESS you have been told otherwise by your provider.   Ask your healthcare provider when you can go back to work   INCISION/Dressing If you are on a blood thinner such as Coumadin, Xarelto, Eliquis, Plavix , or Pradaxa please confirm with your provider when this should be resumed.   If large square, outer bandage is left in place, this can be removed after 24 hours from your procedure. Do not remove steri-strips or glue as below.   Monitor your defibrillator site for redness, swelling, and drainage. Call the device clinic at [phone] if you experience these symptoms or fever/chills.    If your incision is sealed with Dermabond (glue), you may shower 24 hrs after your procedure or when told by your provider. Do not remove the glue or let the shower hit directly on your site. You may wash  around your site with soap and water .    If you were discharged in a sling, please do not wear this during the day more than 48 hours after your surgery unless otherwise instructed. This may increase the risk of stiffness and soreness in your shoulder.   Avoid lotions, ointments, or perfumes over your incision until it is well-healed.  You may use a hot tub or a pool AFTER your wound check appointment if the incision is completely closed.  Your ICD is designed to protect you from life threatening heart rhythms. Because of this, you may receive a shock.   1 shock with no symptoms:  Call the office during business hours. 1 shock with symptoms (chest pain, chest pressure, dizziness, lightheadedness, shortness of breath, overall feeling unwell):  Call 911. If you experience 2 or more shocks in 24 hours:  Call 911. If you receive a shock, you should not drive for 6 months per the [place] DMV IF you receive appropriate therapy from your ICD.   ICD Alerts:  Some alerts are vibratory and others beep. These are NOT emergencies. Please call our office to let us  know. If this occurs at night or on weekends, it can wait until the next business day. Send a remote transmission.  If your device is capable of reading fluid status (for heart failure), you will be offered monthly monitoring to review this with you.   DEVICE MANAGEMENT Remote  monitoring is used to monitor your ICD from home. This monitoring is scheduled every 91 days by our office. It allows us  to keep an eye on the functioning of your device to ensure it is working properly. You will routinely see your Electrophysiologist annually (more often if necessary). This will appear as a REMOTE check on your MyChart schedule. These are automatic and there is nothing for you to manually do unless otherwise instructed.  You should receive your ID card for your new device in 4-8 weeks. Keep this card with you at all times once received. Consider wearing a  medical alert bracelet or necklace.  Your ICD  may be MRI compatible. This will be discussed at your next office visit/wound check.  You should avoid contact with strong electric or magnetic fields.   Do not use amateur (ham) radio equipment or electric (arc) welding torches. MP3 player headphones with magnets should not be used. Some devices are safe to use if held at least 12 inches (30 cm) from your defibrillator. These include power tools, lawn mowers, and speakers. If you are unsure if something is safe to use, ask your health care provider.  When using your cell phone, hold it to the ear that is on the opposite side from the defibrillator. Do not leave your cell phone in a pocket over the defibrillator.  You may safely use electric blankets, heating pads, computers, and microwave ovens.  Call the office right away if: You have chest pain. You feel more than one shock. You feel more short of breath than you have felt before. You feel more light-headed than you have felt before. Your incision starts to open up.  This information is not intended to replace advice given to you by your health care provider. Make sure you discuss any questions you have with your health care provider.

## 2024-10-11 NOTE — Plan of Care (Signed)
  Problem: Education: Goal: Knowledge of General Education information will improve Description: Including pain rating scale, medication(s)/side effects and non-pharmacologic comfort measures Outcome: Progressing   Problem: Clinical Measurements: Goal: Ability to maintain clinical measurements within normal limits will improve Outcome: Progressing Goal: Will remain free from infection Outcome: Progressing Goal: Diagnostic test results will improve Outcome: Progressing Goal: Respiratory complications will improve Outcome: Progressing Goal: Cardiovascular complication will be avoided Outcome: Progressing   Problem: Activity: Goal: Risk for activity intolerance will decrease Outcome: Progressing   Problem: Coping: Goal: Level of anxiety will decrease Outcome: Progressing   Problem: Elimination: Goal: Will not experience complications related to bowel motility Outcome: Progressing Goal: Will not experience complications related to urinary retention Outcome: Progressing

## 2024-10-13 ENCOUNTER — Encounter (HOSPITAL_COMMUNITY): Payer: Self-pay | Admitting: Cardiology

## 2024-10-13 LAB — ECHO INTRAOPERATIVE TEE
Height: 64 in
Weight: 2273.6 [oz_av]

## 2024-10-14 NOTE — Anesthesia Postprocedure Evaluation (Signed)
"   Anesthesia Post Note  Patient: [name] [name]  Procedure(s) Performed: LEAD EXTRACTION ICD IMPLANT     Patient location during evaluation: PACU Anesthesia Type: General Level of consciousness: awake and alert Pain management: pain level controlled Vital Signs Assessment: post-procedure vital signs reviewed and stable Respiratory status: spontaneous breathing, nonlabored ventilation and respiratory function stable Cardiovascular status: blood pressure returned to baseline and stable Postop Assessment: no apparent nausea or vomiting Anesthetic complications: no   There were no known notable events for this encounter.  Last Vitals:  Vitals:   10/11/24 0400 10/11/24 0836  BP: 109/67 118/71  Pulse: 66 65  Resp: 20 18  Temp: 36.7 C 36.7 C  SpO2: 96% 98%    Last Pain:  Vitals:   10/11/24 0836  TempSrc: Oral  PainSc: 0-No pain                 Garnette FORBES Skillern      "

## 2024-10-22 LAB — HEPATIC FUNCTION PANEL
ALT: 25 [IU]/L (ref 0–32)
AST: 26 [IU]/L (ref 0–40)
Albumin: 4.1 g/dL (ref 3.9–4.9)
Alkaline Phosphatase: 220 [IU]/L — ABNORMAL HIGH (ref 49–135)
Bilirubin Total: 0.3 mg/dL (ref 0.0–1.2)
Bilirubin, Direct: 0.12 mg/dL (ref 0.00–0.40)
Total Protein: 6.3 g/dL (ref 6.0–8.5)

## 2024-10-22 LAB — LIPID PANEL
Chol/HDL Ratio: 2.9 ratio (ref 0.0–4.4)
Cholesterol, Total: 124 mg/dL (ref 100–199)
HDL: 43 mg/dL
LDL Chol Calc (NIH): 59 mg/dL (ref 0–99)
Triglycerides: 120 mg/dL (ref 0–149)
VLDL Cholesterol Cal: 22 mg/dL (ref 5–40)

## 2024-10-23 ENCOUNTER — Ambulatory Visit

## 2024-10-24 ENCOUNTER — Ambulatory Visit

## 2024-10-24 ENCOUNTER — Ambulatory Visit: Admitting: Cardiovascular Disease

## 2024-10-30 ENCOUNTER — Ambulatory Visit

## 2024-11-20 ENCOUNTER — Encounter

## 2024-12-06 ENCOUNTER — Ambulatory Visit: Admitting: Physician Assistant

## 2025-01-13 ENCOUNTER — Ambulatory Visit: Admitting: Cardiology

## 2025-02-19 ENCOUNTER — Encounter

## 2025-05-21 ENCOUNTER — Encounter

## 2025-08-20 ENCOUNTER — Encounter
